# Patient Record
Sex: Female | Born: 1970
Health system: Southern US, Community
[De-identification: ages and names within clinical notes are randomized; demographics above are authoritative.]

## PROBLEM LIST (undated history)

## (undated) DIAGNOSIS — K219 Gastro-esophageal reflux disease without esophagitis: Secondary | ICD-10-CM

## (undated) DIAGNOSIS — F32A Depression, unspecified: Secondary | ICD-10-CM

## (undated) DIAGNOSIS — T7840XA Allergy, unspecified, initial encounter: Secondary | ICD-10-CM

## (undated) DIAGNOSIS — E669 Obesity, unspecified: Secondary | ICD-10-CM

## (undated) DIAGNOSIS — F329 Major depressive disorder, single episode, unspecified: Secondary | ICD-10-CM

## (undated) DIAGNOSIS — Z86718 Personal history of other venous thrombosis and embolism: Secondary | ICD-10-CM

## (undated) HISTORY — PX: OTHER SURGICAL HISTORY: SHX169

## (undated) HISTORY — DX: Depression, unspecified: F32.A

## (undated) HISTORY — PX: TUBAL LIGATION: SHX77

## (undated) HISTORY — DX: Allergy, unspecified, initial encounter: T78.40XA

## (undated) HISTORY — DX: Obesity, unspecified: E66.9

## (undated) HISTORY — DX: Gastro-esophageal reflux disease without esophagitis: K21.9

## (undated) HISTORY — PX: BREAST BIOPSY: SHX20

## (undated) HISTORY — DX: Personal history of other venous thrombosis and embolism: Z86.718

## (undated) HISTORY — DX: Major depressive disorder, single episode, unspecified: F32.9

---

## 2005-11-27 ENCOUNTER — Encounter: Payer: Self-pay | Admitting: Family Medicine

## 2005-12-07 ENCOUNTER — Other Ambulatory Visit: Admission: RE | Admit: 2005-12-07 | Discharge: 2005-12-07 | Payer: Self-pay | Admitting: Obstetrics and Gynecology

## 2005-12-22 ENCOUNTER — Emergency Department (HOSPITAL_COMMUNITY): Admission: EM | Admit: 2005-12-22 | Discharge: 2005-12-22 | Payer: Self-pay | Admitting: *Deleted

## 2005-12-31 ENCOUNTER — Encounter: Payer: Self-pay | Admitting: Family Medicine

## 2005-12-31 LAB — CONVERTED CEMR LAB
Cholesterol: 261 mg/dL
Hemoglobin: 13.4 g/dL
Platelets: 316 10*3/uL
Triglycerides: 180 mg/dL
WBC, blood: 6.4 10*3/uL

## 2006-01-01 ENCOUNTER — Encounter: Payer: Self-pay | Admitting: Family Medicine

## 2006-01-01 LAB — CONVERTED CEMR LAB
AST: 24 units/L
Alkaline Phosphatase: 57 units/L
BUN: 14 mg/dL
Chloride: 105 meq/L
Creatinine, Ser: 0.7 mg/dL
Glucose, Bld: 87 mg/dL
Potassium: 5.1 meq/L

## 2006-02-02 ENCOUNTER — Ambulatory Visit (HOSPITAL_COMMUNITY): Admission: RE | Admit: 2006-02-02 | Discharge: 2006-02-02 | Payer: Self-pay | Admitting: Obstetrics and Gynecology

## 2006-02-02 ENCOUNTER — Encounter (INDEPENDENT_AMBULATORY_CARE_PROVIDER_SITE_OTHER): Payer: Self-pay | Admitting: Specialist

## 2007-08-25 ENCOUNTER — Ambulatory Visit: Payer: Self-pay | Admitting: Family Medicine

## 2007-08-25 DIAGNOSIS — F329 Major depressive disorder, single episode, unspecified: Secondary | ICD-10-CM | POA: Insufficient documentation

## 2007-08-26 ENCOUNTER — Ambulatory Visit (HOSPITAL_COMMUNITY): Payer: Self-pay | Admitting: Psychiatry

## 2007-08-30 ENCOUNTER — Other Ambulatory Visit (HOSPITAL_COMMUNITY): Admission: RE | Admit: 2007-08-30 | Discharge: 2007-11-28 | Payer: Self-pay | Admitting: Psychiatry

## 2007-08-31 ENCOUNTER — Ambulatory Visit: Payer: Self-pay | Admitting: Psychiatry

## 2007-09-06 ENCOUNTER — Encounter: Payer: Self-pay | Admitting: Family Medicine

## 2007-09-07 ENCOUNTER — Encounter: Payer: Self-pay | Admitting: Family Medicine

## 2007-09-15 ENCOUNTER — Encounter: Payer: Self-pay | Admitting: Family Medicine

## 2007-09-16 ENCOUNTER — Ambulatory Visit (HOSPITAL_COMMUNITY): Payer: Self-pay | Admitting: Psychiatry

## 2007-09-30 ENCOUNTER — Ambulatory Visit (HOSPITAL_COMMUNITY): Payer: Self-pay | Admitting: Psychiatry

## 2007-10-21 ENCOUNTER — Ambulatory Visit (HOSPITAL_COMMUNITY): Payer: Self-pay | Admitting: Psychiatry

## 2007-11-02 ENCOUNTER — Ambulatory Visit (HOSPITAL_COMMUNITY): Payer: Self-pay | Admitting: Psychiatry

## 2007-11-10 ENCOUNTER — Ambulatory Visit (HOSPITAL_COMMUNITY): Payer: Self-pay | Admitting: Psychiatry

## 2007-11-17 ENCOUNTER — Ambulatory Visit (HOSPITAL_COMMUNITY): Payer: Self-pay | Admitting: Psychiatry

## 2007-12-05 ENCOUNTER — Ambulatory Visit (HOSPITAL_COMMUNITY): Payer: Self-pay | Admitting: Psychiatry

## 2007-12-19 ENCOUNTER — Ambulatory Visit (HOSPITAL_COMMUNITY): Payer: Self-pay | Admitting: Psychiatry

## 2007-12-27 ENCOUNTER — Ambulatory Visit (HOSPITAL_COMMUNITY): Payer: Self-pay | Admitting: Psychiatry

## 2008-01-05 ENCOUNTER — Ambulatory Visit: Payer: Self-pay | Admitting: Family Medicine

## 2008-01-05 DIAGNOSIS — R635 Abnormal weight gain: Secondary | ICD-10-CM | POA: Insufficient documentation

## 2008-01-12 ENCOUNTER — Ambulatory Visit (HOSPITAL_COMMUNITY): Payer: Self-pay | Admitting: Psychiatry

## 2008-01-23 ENCOUNTER — Ambulatory Visit (HOSPITAL_COMMUNITY): Payer: Self-pay | Admitting: Psychiatry

## 2008-02-01 ENCOUNTER — Ambulatory Visit (HOSPITAL_COMMUNITY): Payer: Self-pay | Admitting: Psychiatry

## 2008-02-06 ENCOUNTER — Ambulatory Visit: Payer: Self-pay | Admitting: Family Medicine

## 2008-02-06 ENCOUNTER — Ambulatory Visit (HOSPITAL_COMMUNITY): Payer: Self-pay | Admitting: Psychology

## 2008-02-06 DIAGNOSIS — J45909 Unspecified asthma, uncomplicated: Secondary | ICD-10-CM | POA: Insufficient documentation

## 2008-02-20 ENCOUNTER — Ambulatory Visit (HOSPITAL_COMMUNITY): Payer: Self-pay | Admitting: Psychology

## 2008-02-21 ENCOUNTER — Ambulatory Visit: Payer: Self-pay | Admitting: Family Medicine

## 2008-02-29 ENCOUNTER — Telehealth: Payer: Self-pay | Admitting: Family Medicine

## 2008-03-07 ENCOUNTER — Encounter: Admission: RE | Admit: 2008-03-07 | Discharge: 2008-03-07 | Payer: Self-pay | Admitting: Family Medicine

## 2008-03-07 ENCOUNTER — Ambulatory Visit (HOSPITAL_COMMUNITY): Payer: Self-pay | Admitting: Psychiatry

## 2008-03-07 ENCOUNTER — Encounter: Payer: Self-pay | Admitting: Family Medicine

## 2008-03-07 ENCOUNTER — Ambulatory Visit: Payer: Self-pay | Admitting: Family Medicine

## 2008-03-07 ENCOUNTER — Other Ambulatory Visit: Admission: RE | Admit: 2008-03-07 | Discharge: 2008-03-07 | Payer: Self-pay | Admitting: Family Medicine

## 2008-03-07 DIAGNOSIS — E669 Obesity, unspecified: Secondary | ICD-10-CM | POA: Insufficient documentation

## 2008-03-07 LAB — CONVERTED CEMR LAB
Alkaline Phosphatase: 44 units/L (ref 39–117)
CO2: 24 meq/L (ref 19–32)
Cholesterol: 154 mg/dL (ref 0–200)
Creatinine, Ser: 0.82 mg/dL (ref 0.40–1.20)
Glucose, Bld: 93 mg/dL (ref 70–99)
HDL: 45 mg/dL (ref >39)
LDL Cholesterol: 91 mg/dL (ref 0–99)
Total Bilirubin: 2.5 mg/dL — ABNORMAL HIGH (ref 0.3–1.2)
Total CHOL/HDL Ratio: 3.4
Triglycerides: 91 mg/dL (ref <150)
VLDL: 18 mg/dL (ref 0–40)

## 2008-03-08 ENCOUNTER — Encounter: Payer: Self-pay | Admitting: Family Medicine

## 2008-03-12 ENCOUNTER — Encounter: Admission: RE | Admit: 2008-03-12 | Discharge: 2008-03-12 | Payer: Self-pay | Admitting: Family Medicine

## 2008-03-21 ENCOUNTER — Encounter (INDEPENDENT_AMBULATORY_CARE_PROVIDER_SITE_OTHER): Payer: Self-pay | Admitting: *Deleted

## 2008-06-15 ENCOUNTER — Telehealth: Payer: Self-pay | Admitting: Family Medicine

## 2008-09-10 ENCOUNTER — Ambulatory Visit (HOSPITAL_COMMUNITY): Payer: Self-pay | Admitting: Psychiatry

## 2008-09-24 ENCOUNTER — Ambulatory Visit: Payer: Self-pay | Admitting: Family Medicine

## 2008-09-24 DIAGNOSIS — N3941 Urge incontinence: Secondary | ICD-10-CM | POA: Insufficient documentation

## 2008-09-24 DIAGNOSIS — N3000 Acute cystitis without hematuria: Secondary | ICD-10-CM | POA: Insufficient documentation

## 2008-09-24 LAB — CONVERTED CEMR LAB
Nitrite: NEGATIVE
Protein, U semiquant: NEGATIVE
Urobilinogen, UA: 0.2
WBC Urine, dipstick: NEGATIVE

## 2008-10-01 ENCOUNTER — Ambulatory Visit: Payer: Self-pay | Admitting: Family Medicine

## 2008-10-01 DIAGNOSIS — R310 Gross hematuria: Secondary | ICD-10-CM | POA: Insufficient documentation

## 2008-10-01 LAB — CONVERTED CEMR LAB
Glucose, Urine, Semiquant: NEGATIVE
Nitrite: NEGATIVE
Urobilinogen, UA: 0.2
WBC Urine, dipstick: NEGATIVE
pH: 7.5

## 2008-10-03 ENCOUNTER — Encounter: Admission: RE | Admit: 2008-10-03 | Discharge: 2008-10-03 | Payer: Self-pay | Admitting: Family Medicine

## 2008-10-18 ENCOUNTER — Ambulatory Visit (HOSPITAL_COMMUNITY): Payer: Self-pay | Admitting: Psychiatry

## 2008-10-24 ENCOUNTER — Encounter: Payer: Self-pay | Admitting: Family Medicine

## 2008-10-26 ENCOUNTER — Ambulatory Visit (HOSPITAL_COMMUNITY): Payer: Self-pay | Admitting: Psychology

## 2008-10-29 ENCOUNTER — Telehealth: Payer: Self-pay | Admitting: Family Medicine

## 2008-11-05 ENCOUNTER — Ambulatory Visit (HOSPITAL_COMMUNITY): Payer: Self-pay | Admitting: Psychology

## 2008-11-12 ENCOUNTER — Ambulatory Visit (HOSPITAL_COMMUNITY): Payer: Self-pay | Admitting: Psychology

## 2008-11-26 ENCOUNTER — Ambulatory Visit (HOSPITAL_COMMUNITY): Payer: Self-pay | Admitting: Psychology

## 2008-11-28 ENCOUNTER — Ambulatory Visit (HOSPITAL_COMMUNITY): Payer: Self-pay | Admitting: Psychiatry

## 2008-12-04 ENCOUNTER — Telehealth: Payer: Self-pay | Admitting: Family Medicine

## 2008-12-12 ENCOUNTER — Ambulatory Visit: Payer: Self-pay | Admitting: Family Medicine

## 2008-12-12 DIAGNOSIS — B029 Zoster without complications: Secondary | ICD-10-CM | POA: Insufficient documentation

## 2008-12-17 ENCOUNTER — Ambulatory Visit: Payer: Self-pay | Admitting: Family Medicine

## 2008-12-17 DIAGNOSIS — M546 Pain in thoracic spine: Secondary | ICD-10-CM | POA: Insufficient documentation

## 2008-12-17 DIAGNOSIS — R209 Unspecified disturbances of skin sensation: Secondary | ICD-10-CM | POA: Insufficient documentation

## 2009-01-01 ENCOUNTER — Telehealth: Payer: Self-pay | Admitting: Family Medicine

## 2009-01-16 ENCOUNTER — Other Ambulatory Visit: Admission: RE | Admit: 2009-01-16 | Discharge: 2009-01-16 | Payer: Self-pay | Admitting: Obstetrics & Gynecology

## 2009-01-16 ENCOUNTER — Encounter: Payer: Self-pay | Admitting: Obstetrics & Gynecology

## 2009-01-16 ENCOUNTER — Ambulatory Visit: Payer: Self-pay | Admitting: Obstetrics & Gynecology

## 2009-01-17 ENCOUNTER — Encounter: Payer: Self-pay | Admitting: Obstetrics & Gynecology

## 2009-01-17 LAB — CONVERTED CEMR LAB
Glucose, Bld: 89 mg/dL (ref 70–99)
HDL: 64 mg/dL (ref >39)
Total CHOL/HDL Ratio: 3.1
VLDL: 17 mg/dL (ref 0–40)

## 2009-01-18 ENCOUNTER — Encounter: Admission: RE | Admit: 2009-01-18 | Discharge: 2009-01-18 | Payer: Self-pay | Admitting: Obstetrics & Gynecology

## 2009-01-23 ENCOUNTER — Ambulatory Visit: Payer: Self-pay | Admitting: Obstetrics & Gynecology

## 2009-02-05 ENCOUNTER — Ambulatory Visit (HOSPITAL_COMMUNITY): Payer: Self-pay | Admitting: Psychology

## 2009-02-13 ENCOUNTER — Encounter: Payer: Self-pay | Admitting: Family Medicine

## 2009-02-27 ENCOUNTER — Ambulatory Visit (HOSPITAL_COMMUNITY): Payer: Self-pay | Admitting: Psychiatry

## 2009-03-05 ENCOUNTER — Ambulatory Visit (HOSPITAL_COMMUNITY): Payer: Self-pay | Admitting: Psychology

## 2009-03-18 ENCOUNTER — Telehealth (INDEPENDENT_AMBULATORY_CARE_PROVIDER_SITE_OTHER): Payer: Self-pay | Admitting: *Deleted

## 2009-03-20 ENCOUNTER — Ambulatory Visit: Payer: Self-pay | Admitting: Family Medicine

## 2009-03-20 DIAGNOSIS — M79609 Pain in unspecified limb: Secondary | ICD-10-CM | POA: Insufficient documentation

## 2009-03-20 LAB — CONVERTED CEMR LAB
Anti Nuclear Antibody(ANA): NEGATIVE
Basophils Absolute: 0 10*3/uL (ref 0.0–0.1)
Basophils Relative: 0 % (ref 0–1)
CO2: 24 meq/L (ref 19–32)
Creatinine, Ser: 0.64 mg/dL (ref 0.40–1.20)
Eosinophils Relative: 1 % (ref 0–5)
Folate: 14.2 ng/mL
Glucose, Bld: 91 mg/dL (ref 70–99)
Hemoglobin: 14.4 g/dL (ref 12.0–15.0)
MCHC: 34.9 g/dL (ref 30.0–36.0)
Monocytes Absolute: 0.5 10*3/uL (ref 0.1–1.0)
Neutro Abs: 3.8 10*3/uL (ref 1.7–7.7)
RDW: 13.3 % (ref 11.5–15.5)
Total Bilirubin: 1.6 mg/dL — ABNORMAL HIGH (ref 0.3–1.2)
Vitamin B-12: 556 pg/mL (ref 211–911)

## 2009-03-21 ENCOUNTER — Telehealth: Payer: Self-pay | Admitting: Family Medicine

## 2009-03-26 ENCOUNTER — Telehealth (INDEPENDENT_AMBULATORY_CARE_PROVIDER_SITE_OTHER): Payer: Self-pay | Admitting: *Deleted

## 2009-03-27 ENCOUNTER — Ambulatory Visit (HOSPITAL_COMMUNITY): Payer: Self-pay | Admitting: Psychology

## 2009-04-05 ENCOUNTER — Telehealth: Payer: Self-pay | Admitting: Family Medicine

## 2009-04-26 ENCOUNTER — Ambulatory Visit: Payer: Self-pay | Admitting: Physical Medicine & Rehabilitation

## 2009-04-26 ENCOUNTER — Encounter: Payer: Self-pay | Admitting: Family Medicine

## 2010-01-20 ENCOUNTER — Ambulatory Visit: Payer: Self-pay | Admitting: Family Medicine

## 2010-01-20 ENCOUNTER — Other Ambulatory Visit: Admission: RE | Admit: 2010-01-20 | Discharge: 2010-01-20 | Payer: Self-pay | Admitting: Family Medicine

## 2010-01-21 ENCOUNTER — Ambulatory Visit: Payer: Self-pay | Admitting: Family Medicine

## 2010-01-23 LAB — CONVERTED CEMR LAB: Pap Smear: NEGATIVE

## 2010-05-21 ENCOUNTER — Ambulatory Visit: Payer: Self-pay | Admitting: Family Medicine

## 2010-05-21 DIAGNOSIS — IMO0002 Reserved for concepts with insufficient information to code with codable children: Secondary | ICD-10-CM | POA: Insufficient documentation

## 2010-05-21 DIAGNOSIS — H05229 Edema of unspecified orbit: Secondary | ICD-10-CM | POA: Insufficient documentation

## 2010-05-22 ENCOUNTER — Encounter: Admission: RE | Admit: 2010-05-22 | Discharge: 2010-05-22 | Payer: Self-pay | Admitting: Family Medicine

## 2010-05-27 ENCOUNTER — Ambulatory Visit: Payer: Self-pay | Admitting: Family Medicine

## 2010-05-27 ENCOUNTER — Encounter: Admission: RE | Admit: 2010-05-27 | Discharge: 2010-05-27 | Payer: Self-pay | Admitting: Family Medicine

## 2010-05-27 DIAGNOSIS — S8010XA Contusion of unspecified lower leg, initial encounter: Secondary | ICD-10-CM | POA: Insufficient documentation

## 2010-06-02 ENCOUNTER — Ambulatory Visit: Payer: Self-pay | Admitting: Family Medicine

## 2010-06-02 ENCOUNTER — Encounter: Admission: RE | Admit: 2010-06-02 | Discharge: 2010-06-02 | Payer: Self-pay | Admitting: Family Medicine

## 2010-06-02 DIAGNOSIS — S060X9A Concussion with loss of consciousness of unspecified duration, initial encounter: Secondary | ICD-10-CM | POA: Insufficient documentation

## 2010-06-02 DIAGNOSIS — S060XAA Concussion with loss of consciousness status unknown, initial encounter: Secondary | ICD-10-CM | POA: Insufficient documentation

## 2010-10-20 ENCOUNTER — Ambulatory Visit: Payer: Self-pay | Admitting: Family Medicine

## 2010-10-20 DIAGNOSIS — S335XXA Sprain of ligaments of lumbar spine, initial encounter: Secondary | ICD-10-CM | POA: Insufficient documentation

## 2010-10-27 ENCOUNTER — Encounter
Admission: RE | Admit: 2010-10-27 | Discharge: 2010-12-08 | Payer: Self-pay | Source: Home / Self Care | Attending: Family Medicine | Admitting: Family Medicine

## 2010-10-29 ENCOUNTER — Ambulatory Visit: Payer: Self-pay | Admitting: Family Medicine

## 2010-10-29 LAB — CONVERTED CEMR LAB
Nitrite: POSITIVE
Urobilinogen, UA: 0.2
pH: 5.5

## 2010-10-30 ENCOUNTER — Encounter: Payer: Self-pay | Admitting: Family Medicine

## 2010-11-21 ENCOUNTER — Encounter: Admission: RE | Admit: 2010-11-21 | Discharge: 2010-11-21 | Payer: Self-pay | Admitting: Family Medicine

## 2010-12-03 ENCOUNTER — Encounter: Payer: Self-pay | Admitting: Family Medicine

## 2010-12-08 ENCOUNTER — Ambulatory Visit: Payer: Self-pay | Admitting: Family Medicine

## 2010-12-30 ENCOUNTER — Telehealth: Payer: Self-pay | Admitting: Family Medicine

## 2011-01-18 ENCOUNTER — Encounter: Payer: Self-pay | Admitting: Family Medicine

## 2011-01-18 ENCOUNTER — Encounter: Payer: Self-pay | Admitting: Obstetrics & Gynecology
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
## 2011-01-27 NOTE — Assessment & Plan Note (Signed)
Summary: POSS UTI/TJ (rm 3)   Vital Signs:  Patient Profile:   40 Years Old Female CC:      dysuria, urinary frequency x 3 days Height:     68 inches Weight:      223 pounds O2 Sat:      100 % O2 treatment:    Room Air Temp:     98.6 degrees F oral Pulse rate:   91 / minute Resp:     14 per minute BP sitting:   130 / 87  (left arm) Cuff size:   large  Pt. in pain?   no  Vitals Entered By: Lajean Saver RN (October 29, 2010 4:47 PM)                   Updated Prior Medication List: No Medications Current Allergies: No known allergies History of Present Illness Chief Complaint: dysuria, urinary frequency x 3 days History of Present Illness:  Subjective:  Patient presents complaining of UTI symptoms for 3 days.  Complains of dysuria, frequency, and urgency.  No hematuria or nocturia.  No abnormal vaginal discharge.  No fever/chills/sweats.  No abdominal pain.  No flank pain.  No nausea/vomiting.    REVIEW OF SYSTEMS Constitutional Symptoms      Denies fever, chills, night sweats, weight loss, weight gain, and fatigue.  Eyes       Denies change in vision, eye pain, eye discharge, glasses, contact lenses, and eye surgery. Ear/Nose/Throat/Mouth       Denies hearing loss/aids, change in hearing, ear pain, ear discharge, dizziness, frequent runny nose, frequent nose bleeds, sinus problems, sore throat, hoarseness, and tooth pain or bleeding.  Respiratory       Denies dry cough, productive cough, wheezing, shortness of breath, asthma, bronchitis, and emphysema/COPD.  Cardiovascular       Denies murmurs, chest pain, and tires easily with exhertion.    Gastrointestinal       Denies stomach pain, nausea/vomiting, diarrhea, constipation, blood in bowel movements, and indigestion. Genitourniary       Complains of painful urination.      Denies blood or discharge from vagina, kidney stones, and loss of urinary control.      Comments: urinary frequency Neurological       Denies  paralysis, seizures, and fainting/blackouts. Musculoskeletal       Denies muscle pain, joint pain, joint stiffness, decreased range of motion, redness, swelling, muscle weakness, and gout.  Skin       Denies bruising, unusual mles/lumps or sores, and hair/skin or nail changes.  Psych       Denies mood changes, temper/anger issues, anxiety/stress, speech problems, depression, and sleep problems.  Past History:  Past Medical History: Reviewed history from 01/20/2010 and no changes required. depression [employer] [id number] GERD obesity hx of superficial thromboembolism, L medial thigh lipoma, varicose veins Allergic rhinitis has been on zoloft chondromalacia patella- right  Past Surgical History: Reviewed history from 09/06/2007 and no changes required. BTL uterine ablation L breast biopsy 11/01  Family History: Reviewed history from 09/06/2007 and no changes required. mother alive, breast cancer at 48, +BRCA,  bladder cancer father alive and healthy brother healthy  Social History: Reviewed history from 10/20/2010 and no changes required. 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 Denies drug use 5 cups caffeine daily   Objective:   No acute distress, appears comfortable. Mouth:  moist mucous membranes Abdomen:  No  tenderness to deep palpation. No rebound or guarding.  Non-distended.  Active bowel sounds.  No CVA or flank tenderness.  No masses or hepatosplenomegaly.  Urinalysis reviewed.  urinalysis (dipstick):  2+ blood, positive nitrite Assessment New Problems: ACUTE CYSTITIS (ICD-595.0)   Plan New Medications/Changes: SULFAMETHOXAZOLE-TMP DS 800-160 MG TABS (SULFAMETHOXAZOLE-TRIMETHOPRIM) One by mouth two times a day  #10 x 0, 10/29/2010, Donna Christen MD  New Orders: Urinalysis [[id number]] Est. Patient Level III [16109] T-Culture, Urine [[id number]] Planning Comments:   Urine culture. Begin  Septra.  Increase fluids.  Follow-up with PCP if not improving.    The patient and/or caregiver has been counseled thoroughly with regard to medications prescribed including dosage, schedule, interactions, rationale for use, and possible side effects and they verbalize understanding.  Diagnoses and expected course of recovery discussed and will return if not improved as expected or if the condition worsens. Patient and/or caregiver verbalized understanding.  Prescriptions: SULFAMETHOXAZOLE-TMP DS 800-160 MG TABS (SULFAMETHOXAZOLE-TRIMETHOPRIM) One by mouth two times a day  #10 x 0   Entered and Authorized by:   Donna Christen MD   Signed by:   Donna Christen MD on 10/29/2010   Method used:   Print then Give to Patient   RxID:   [id number]   Orders Added: 1)  Urinalysis [81003-65000] 2)  Est. Patient Level III [78469] 3)  T-Culture, Urine [[id number]]    Laboratory Results   Urine Tests  Date/Time Received: October 29, 2010 4:53 PM  Date/Time Reported: October 29, 2010 4:53 PM   Routine Urinalysis   Color: yellow Appearance: Hazy Glucose: negative   (Normal Range: Negative) Bilirubin: negative   (Normal Range: Negative) Ketone: negative   (Normal Range: Negative) Spec. Gravity: >=1.030   (Normal Range: 1.003-1.035) Blood: 2+   (Normal Range: Negative) pH: 5.5   (Normal Range: 5.0-8.0) Protein: negative   (Normal Range: Negative) Urobilinogen: 0.2   (Normal Range: 0-1) Nitrite: positive   (Normal Range: Negative) Leukocyte Esterace: negative   (Normal Range: Negative)

## 2011-01-27 NOTE — Assessment & Plan Note (Signed)
Summary: BRCA testing  Nurse Visit   Vitals Entered By: Payton Spark CMA (January 21, 2010 9:46 AM)  Allergies: No Known Drug Allergies  Orders Added: 1)  Est. Patient Level I [29562]    Impression & Recommendations:  Problem # 1:  FAMILY HISTORY OF MALIGNANT NEOPLASM OF BREAST (ICD-V16.3) BRCA testing done today by CMA today.  Brochure given to pt.  Specimen collected and shipped out. Orders: Est. Patient Level I (13086)  Complete Medication List: 1)  Omeprazole 10 Mg Cpdr (Omeprazole) .... Take three tabs by mouth daily 2)  One Daily Womens Tabs (Multiple vitamins-minerals) .... Take 1 tablet by mouth once a day 3)  Vitamin B12 100 Mcg Tabs (Cyanocobalamin) .... Take 1 tablet by mouth once a day 4)  Symbyax 3-25 Mg Caps (Olanzapine-fluoxetine hcl) .... Take 1 cap by mouth two times a day 5)  Amoxicillin 500 Mg Caps (Amoxicillin) .Marland Kitchen.. 1 tab by mouth three times a day x 7 days
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
## 2011-01-27 NOTE — Assessment & Plan Note (Signed)
Summary: CPE with pap   Vital Signs:  Patient profile:   40 year old female Height:      68 inches Weight:      256 pounds BMI:     39.07 O2 Sat:      100 % on Room air Temp:     98.0 degrees F oral Pulse rate:   75 / minute BP sitting:   115 / 77  (left arm) Cuff size:   large  Vitals Entered By: Payton Spark CMA (January 20, 2010 3:45 PM)  O2 Flow:  Room air CC: CPE w/ pap   Primary Care Provider:  [name] DO  CC:  CPE w/ pap.  History of Present Illness: 39 yo WF presents for CPE with pap smear.  She had a uterine ablation so no longer has periods.  she is seeing the [facility] in [facility] for psychiatric care -- doing well on symbiax.  She gained 46 lbs in the last 9 months.  She is due for a mammogram.  She was w/o insurance for a while.  She is due for fasting labs.  She has fam hx of breast cancer and her mom tested BRCA +.  She is interested in BRCA testing for both she and her daughter.  Denies fam hx of colon cancer or premature heart dz.  She does continue to smoke.    Allergies: No Known Drug Allergies  Past History:  Past Medical History: depression The [employer] [id number] GERD obesity hx of superficial thromboembolism, L medial thigh lipoma, varicose veins Allergic rhinitis has been on zoloft chondromalacia patella- right  Past Surgical History: Reviewed history from 09/06/2007 and no changes required. BTL uterine ablation L breast biopsy 11/01  Family History: Reviewed history from 09/06/2007 and no changes required. mother alive, breast cancer at 79, +BRCA,  bladder cancer father alive and healthy brother healthy  Social History: Reviewed history from 02/21/2008 and no changes required. Investment banker, corporate for [employer] Has 3 kids Married to [employer] Smokes 1 ppw-- quit 2-09 Social ETOH. Denies drug use 5 cups caffeine daily  Review of Systems       The patient complains of weight gain.  The patient denies anorexia, fever, weight  loss, vision loss, decreased hearing, hoarseness, chest pain, syncope, dyspnea on exertion, peripheral edema, prolonged cough, headaches, hemoptysis, abdominal pain, melena, hematochezia, severe indigestion/heartburn, hematuria, incontinence, genital sores, muscle weakness, suspicious skin lesions, transient blindness, difficulty walking, depression, unusual weight change, abnormal bleeding, enlarged lymph nodes, angioedema, breast masses, and testicular masses.    Physical Exam  General:  alert, well-developed, well-nourished, and well-hydrated.  obese Head:  normocephalic and atraumatic.   Eyes:  pupils equal, pupils round, and pupils reactive to light.   Ears:  no external deformities.   Nose:  no nasal discharge.   Mouth:  good dentition and pharynx pink and moist.   Neck:  no masses.   Breasts:  No mass, nodules, thickening, tenderness, bulging, retraction, inflamation, nipple discharge or skin changes noted.   Lungs:  Normal respiratory effort, chest expands symmetrically. Lungs are clear to auscultation, no crackles or wheezes. Heart:  Normal rate and regular rhythm. S1 and S2 normal without gallop, murmur, click, rub or other extra sounds. Abdomen:  Bowel sounds positive,abdomen soft and non-tender without masses, organomegaly or hernias noted. Genitalia:  Pelvic Exam:        External: normal female genitalia without lesions or masses        Vagina: normal  without lesions or masses        Cervix: normal without lesions or masses        Adnexa: normal bimanual exam without masses or fullness        Uterus: normal by palpation        Pap smear: performed Pulses:  2+ radial and pedal pulses Extremities:  no LE edema Skin:  color normal.   Cervical Nodes:  No lymphadenopathy noted Psych:  good eye contact, not anxious appearing, and not depressed appearing.     Impression & Recommendations:  Problem # 1:  ROUTINE GYNECOLOGICAL EXAMINATION (ICD-V72.31) Keeping healthy checklist for  women reviewed. BP at goal.  BMI 39 c/w class II obesity.  Will work on diet, exercise, wt loss and RTC in 3 mos to check on her progress. Tetanus is UTD. Update mammogram. Info given on BRCA testing. Update fasting labs. Counseled on smoking cessation. Daily MVI and Calcium/D recommended.    Complete Medication List: 1)  Omeprazole 10 Mg Cpdr (Omeprazole) .... Take three tabs by mouth daily 2)  One Daily Womens Tabs (Multiple vitamins-minerals) .... Take 1 tablet by mouth once a day 3)  Vitamin B12 100 Mcg Tabs (Cyanocobalamin) .... Take 1 tablet by mouth once a day 4)  Symbyax 3-25 Mg Caps (Olanzapine-fluoxetine hcl) .... Take 1 cap by mouth two times a day 5)  Amoxicillin 500 Mg Caps (Amoxicillin) .Marland Kitchen.. 1 tab by mouth three times a day x 7 days  Other Orders: T-Comprehensive Metabolic Panel [phone]) T-Lipid Profile ([id number]) T-Mammography Bilateral Screening (27062)  Patient Instructions: 1)  Take Amoxicillin for 1 wk to cover for strep throat. 2)  Use Advil and throat lozenges for comfort. 3)  Call if you want to do the BRCA testing here. 4)  Work on [employer] and regular exercise. 5)  Update fasting labs. 6)  Will call you w/ pap smear results in 2-3 days. 7)  Return for f/u weight in 3 mos. Prescriptions: AMOXICILLIN 500 MG CAPS (AMOXICILLIN) 1 tab by mouth three times a day x 7 days  #21 x 0   Entered and Authorized by:   [name] DO   Signed by:   [name] DO on 01/20/2010   Method used:   Electronically to        [facility] [phone]* (retail)       [address] [place], [place]  [address]       Ph: [phone]       Fax: [phone]   RxID:   [id number]
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
## 2011-01-29 NOTE — Miscellaneous (Signed)
Summary: PT Discharge/[facility]  PT Discharge/[facility]   Imported By: Lanelle Bal 12/19/2010 08:41:53  _____________________________________________________________________  External Attachment:    Type:   Image     Comment:   External Document
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
## 2011-01-29 NOTE — Progress Notes (Signed)
Summary: Ortho referral  Phone Note Call from Patient Call back at Home Phone [phone]   Caller: Patient Call For: [name] DO Summary of Call: Pt calls and states you had discussed her seeing a sports med doctor for her back before the holidays. She would like to get this set up for her back pain. Initial call taken by: Kathlene November LPN,  December 30, 2010 4:18 PM  Follow-up for Phone Call        done/. Follow-up by: [name] DO,  December 31, 2010 7:49 AM

## 2011-05-12 NOTE — Assessment & Plan Note (Signed)
NAME:  Michaela Dodson, Michaela Dodson              ACCOUNT NO.:  [id number]   MEDICAL RECORD NO.:  [id number]          PATIENT TYPE:  POB   LOCATION:  [facility] [place]         FACILITY:  [facility]   PHYSICIAN:  Allie Bossier, MD        DATE OF BIRTH:  19-Sep-1971   DATE OF SERVICE:                                  CLINIC NOTE   [name] is a 40 year old married white lady who is following up on her  ultrasound.  She was seen for her annual exam here last week, and I felt  on bimanual exam that her uterus is enlarged as per the ultrasound.  Her  ultrasound actually shows her uterus to measure 9.9 x 7.2 cm with the  possibility of adenomyosis versus a small fibroid.   LABORATORY DATA:  Essentially normal, thyroid was normal.  Glucose was  normal.  Her LDL was slightly elevated at 120, but her HDL was great at  64, and her total cholesterol was 201.  Triglycerides were well and in  normal limits at 87 as well.  Her other complaint the other day was that  of new onset deep dyspareunia for the last 4 months.  I think that it is  related to her probable IVF.  With her husband present, I got more  information specifically that she uses laxatives at least 3 days a week  along with fiber, but still has constipation.  I suggested that for the  next 2 weeks amiodarone, she uses MiraLax on a daily basis and start  with DanActive yogurt drinks on a daily basis, as well as increasing her  water intake to at least 60 ounces a day.  She will come back in 1-2  months and let me know how this is working for her.      Allie Bossier, MD     MCD/MEDQ  D:  01/23/2009  T:  01/24/2009  Job:  [id number]

## 2011-05-12 NOTE — Assessment & Plan Note (Signed)
NAME:  Michaela Dodson, Michaela Dodson              ACCOUNT NO.:  [id number]   MEDICAL RECORD NO.:  [id number]          PATIENT TYPE:  POB   LOCATION:  [facility] [place]         FACILITY:  [facility]   PHYSICIAN:  Allie Bossier, MD        DATE OF BIRTH:  1971-07-25   DATE OF SERVICE:                                  CLINIC NOTE   HISTORY OF PRESENT ILLNESS:  Michaela Dodson is 40 year old married white  gravida 4, para 3 who comes in for annual exam.  She has no particular  complaints today except that of not being able to lose weight.  She has  lost 40 pounds recently, but it has come to a standstill on her weight  loss.  She has not had her thyroid checked in many years.   PAST MEDICAL HISTORY:  Depression.   PAST SURGICAL HISTORY:  She has had a cystoscopy recently for a  hematuria.  This was done at the [facility], endometrial ablation in  January 2005, and she has been amenorrhea since that surgery,  sterilization procedure postpartum done in 2004, appendectomy, and  wisdom teeth extraction.  She had a right breast abscess surgery done  when she was 40 years old and a left breast biopsy done that was  reportedly benign as an adult.   ALLERGIES:  No known drug allergies.  No latex allergies.   FAMILY HISTORY:  Significant for breast and bladder cancer in her mother  who is currently alive and well in her 81s and colon cancer in maternal  grandmother.  She is going to investigate more fully her family history  to see if there is any other breast, colon, or ovarian cancer in the  family.   SOCIAL HISTORY:  She has been married for 13 years.  She owns a cleaning  business.   REVIEW OF SYSTEMS:  Her last Pap smear, she cannot remember when it was,  but she thinks it was in 2004.  She denies a history of abnormal Pap.  She does report some deep dyspareunia for the last 4 months.   PHYSICAL EXAMINATION:  VITAL SIGNS:  Weight 195 pounds, height 5 feet 8  inches.  Blood pressure 161/91 and pulse 107.  HEENT:  Normal.  BREASTS:  Normal bilaterally.  HEART:  Regular rate and rhythm.  LUNGS:  Clear to auscultation bilaterally.  ABDOMEN:  Mildly obese and benign.  EXTERNAL GENITALIA:  No lesions.  Cervix normal.  Uterus is 10-week size  consistent with fibroids.  Adnexa are not palpable.   ASSESSMENT AND PLAN:  1. Annual exam.  We checked a Pap smear.  2. We will recommend a self-breast and self vulvar exams.  With regard      to her uterine enlargement, I have ordered a GYN ultrasound.  This      may be the cause of her deep dyspareunia.  3. Screening labs.  I have ordered a fasting sugar, lipids, and TSH to      be done on the days that she gets her ultrasound done.  She will      follow up in 2 weeks for followup of all these things.  Allie Bossier, MD     MCD/MEDQ  D:  01/16/2009  T:  01/17/2009  Job:  [id number]

## 2011-05-15 NOTE — Op Note (Signed)
Michaela Dodson, Michaela Dodson              ACCOUNT NO.:  [id number]   MEDICAL RECORD NO.:  [id number]          PATIENT TYPE:  AMB   LOCATION:  [facility]                           FACILITY:  [facility]   PHYSICIAN:  Carrington Clamp, M.D. DATE OF BIRTH:  [birth year]   DATE OF PROCEDURE:  02/02/2006  DATE OF DISCHARGE:                                 OPERATIVE REPORT   PREOPERATIVE DIAGNOSES:  1.  Menometrorrhagia.  2.  History deep vein thrombosis.   POSTOPERATIVE DIAGNOSES:  1.  Menometrorrhagia.  2.  History deep vein thrombosis.   PROCEDURE:  Dilation and curettage with hysteroscopy and NovaSure ablation.   SURGEON:  Carrington Clamp, M.D.   ASSISTANT:  None.  Posey Boyer of NovaSure was present in the operating  room.   ANESTHESIA:  LMA.   SPECIMENS:  Uterine curettings.   ESTIMATED BLOOD LOSS:  Minimal.   IV FLUIDS:  800 mL.   Urine output was not measured.  Fluid deficit on the hysteroscopy was 50 mL.   COMPLICATIONS:  None.   FINDINGS:  The uterus sounded to 7 cm with a 4 cm cervix.  Width of the  cavity was 4.8.  The NovaSure ablation was performed at 158 watts for 59  seconds.  There was a possible polyp seen inside the uterine cavity but no  other abnormalities.   Counts were correct x3.  No medications.   TECHNIQUE:  After adequate general anesthesia was achieved, the patient was  prepped and draped in the sterile fashion in the dorsal lithotomy position.  This bladder was emptied with a red rubber catheter and then a speculum  placed in the vagina.  A single-toothed tenaculum was placed in the cervix  and the cervix was dilated up with Shawnie Pons dilators after the cervix had been  measured at 4 cm.  The uterus was sounded at 10 cm.  The hysteroscope was  placed into the uterine cavity and the above findings noted.  A vigorous D&C  was performed with sharp curettage.  Tissue was sent to pathology.  The  NovaSure instrument was then placed inside the cavity with the above  parameters and after CO2 test was passed, the instrument was used at a power  of 158 for 59 seconds.  The NovaSure  instrument was removed without complication and hysteroscopy indicated that  the ablation had contacted all surfaces adequately and the uterus was still  intact.  All instruments were then withdrawn from the vagina and the patient  returned to the recovery room in stable condition.      Carrington Clamp, M.D.  Electronically Signed     MH/MEDQ  D:  02/02/2006  T:  02/02/2006  Job:  [id number]

## 2011-06-03 ENCOUNTER — Ambulatory Visit (INDEPENDENT_AMBULATORY_CARE_PROVIDER_SITE_OTHER): Payer: Commercial Indemnity | Admitting: Family Medicine

## 2011-06-03 ENCOUNTER — Telehealth: Payer: Self-pay | Admitting: Family Medicine

## 2011-06-03 VITALS — Temp 98.3°F

## 2011-06-03 DIAGNOSIS — R300 Dysuria: Secondary | ICD-10-CM

## 2011-06-03 DIAGNOSIS — R319 Hematuria, unspecified: Secondary | ICD-10-CM

## 2011-06-03 LAB — POCT URINALYSIS DIPSTICK
Bilirubin, UA: NEGATIVE
Ketones, UA: NEGATIVE
Leukocytes, UA: NEGATIVE
pH, UA: 7

## 2011-06-03 NOTE — Telephone Encounter (Signed)
Pt aware and agrees 

## 2011-06-03 NOTE — Telephone Encounter (Signed)
Pt aware of the above but states that back pain is only on lower R side and very painful. I advised Pt to drink water, lemonade and start cystex until I call her back w/ advice.

## 2011-06-03 NOTE — Progress Notes (Signed)
  Subjective:    Patient ID: Michaela Dodson, female    DOB: 18-Aug-1971, 40 y.o.   MRN: [id number]  HPI foul smelling urine x 3 days with dysuria and low back pain.  No fevers or chills.   Review of Systems     Objective:   Physical Exam        Assessment & Plan:  Hematuria/ dysuria with only blood present on UA. Will culture given neg nitrites and LE.

## 2011-06-03 NOTE — Patient Instructions (Signed)
Will culture urine and f/u results in the next 2 days.

## 2011-06-03 NOTE — Telephone Encounter (Signed)
Pls let pt know that her UA was + only for blood. Will culture and treat if +. Should be back by Friday.  In the meantime, use Advil for back pain, drink plenty of water and use OTC Cystex pure cranberry.

## 2011-06-03 NOTE — Progress Notes (Signed)
  Subjective:    Patient ID: Michaela Dodson, female    DOB: 26-Oct-1971, 40 y.o.   MRN: [id number]  HPI Pt c/o foul smelling urine and extreme back pain x 3 days.     Review of Systems     Objective:   Physical Exam        Assessment & Plan:

## 2011-06-03 NOTE — Telephone Encounter (Signed)
If her pain is severe and comes and goes, associated with nausea or vomitting, she needs to be evaluated for kidney stone.  If so, schedule OV with me tomorrow or UC today.

## 2011-06-06 ENCOUNTER — Telehealth: Payer: Self-pay | Admitting: Family Medicine

## 2011-06-06 LAB — URINE CULTURE: Colony Count: >=100000

## 2011-06-06 MED ORDER — CIPROFLOXACIN HCL 250 MG PO TABS: 250.0000 mg | ORAL_TABLET | Freq: Two times a day (BID) | ORAL | Status: AC

## 2011-06-06 NOTE — Telephone Encounter (Signed)
I sent pt message re: + urine culture with RX Cipro sent to pharmacy.

## 2011-12-03 ENCOUNTER — Emergency Department (INDEPENDENT_AMBULATORY_CARE_PROVIDER_SITE_OTHER)
Admission: EM | Admit: 2011-12-03 | Discharge: 2011-12-03 | Disposition: A | Discharge status: Home / Self Care | Payer: Commercial Indemnity | Source: Home / Self Care | Attending: Emergency Medicine | Admitting: Emergency Medicine

## 2011-12-03 ENCOUNTER — Encounter: Payer: Self-pay | Admitting: *Deleted

## 2011-12-03 DIAGNOSIS — J111 Influenza due to unidentified influenza virus with other respiratory manifestations: Secondary | ICD-10-CM

## 2011-12-03 DIAGNOSIS — J209 Acute bronchitis, unspecified: Secondary | ICD-10-CM

## 2011-12-03 MED ORDER — OSELTAMIVIR PHOSPHATE 75 MG PO CAPS: ORAL_CAPSULE | ORAL | Status: AC

## 2011-12-03 MED ORDER — PROMETHAZINE-CODEINE 6.25-10 MG/5ML PO SYRP: ORAL_SOLUTION | ORAL | Status: AC

## 2011-12-03 MED ORDER — AZITHROMYCIN 250 MG PO TABS: ORAL_TABLET | ORAL | Status: AC

## 2011-12-03 NOTE — ED Notes (Addendum)
Patient c/o productive cough, HA, fever, body aches x 2 days.

## 2011-12-03 NOTE — ED Provider Notes (Signed)
History     CSN: [id number] Arrival date & time: 12/03/2011  2:54 PM   First MD Initiated Contact with Patient 12/03/11 1454      Chief Complaint  Patient presents with  . Headache  . Generalized Body Aches    (Consider location/radiation/quality/duration/timing/severity/associated sxs/prior treatment) HPI Comments: HPI : Flu symptoms for about 1 day. Fever to 102 with chills, sweats, myalgias, fatigue, headache. Symptoms are progressively worsening, despite trying OTC fever reducing medicine and rest and fluids. Has decreased appetite, but tolerating some liquids by mouth. No history of recent tick bite.  Review of Systems: Positive for fatigue, mild nasal congestion, mild sore throat, mild swollen anterior neck glands, mild cough. Negative for acute vision changes, stiff neck, focal weakness, syncope, seizures, respiratory distress, vomiting, diarrhea, GU symptoms.  The history is provided by the patient.    History reviewed. No pertinent past medical history.  History reviewed. No pertinent past surgical history.  Family History  Problem Relation Age of Onset  . Cancer Mother     breast and bladder CA    History  Substance Use Topics  . Smoking status: Current Everyday Smoker  . Smokeless tobacco: Not on file  . Alcohol Use: Yes    OB History    Grav Para Term Preterm Abortions TAB SAB Ect Mult Living                  Review of Systems  Constitutional: Positive for fever, chills and appetite change.  HENT: Positive for congestion. Negative for facial swelling and neck stiffness.   Eyes: Negative.  Negative for visual disturbance.  Respiratory: Positive for cough (Productive of colored sputum) and wheezing (Rarely has wheezing, no shortness of breath).   Cardiovascular: Negative for chest pain, palpitations and leg swelling.  Gastrointestinal: Negative for abdominal distention.  Genitourinary: Negative.   Neurological: Positive for headaches (Without any  focal neurologic symptoms). Negative for seizures, syncope and numbness.  Hematological: Negative.   Psychiatric/Behavioral: Negative for hallucinations and confusion.    Allergies  Review of patient's allergies indicates no known allergies.  Home Medications   Current Outpatient Rx  Name Route Sig Dispense Refill  . PHENTERMINE HCL 30 MG PO CAPS Oral Take 30 mg by mouth every morning.      . AZITHROMYCIN 250 MG PO TABS  Take 2 tablets on day one, then 1 tablet daily on days 2 through 5 1 each 0  . OSELTAMIVIR PHOSPHATE 75 MG PO CAPS  Starting today, take 1 capsule by mouth twice a day for 5 days. 10 capsule 0  . PROMETHAZINE-CODEINE 6.25-10 MG/5ML PO SYRP  Take 1-2 teaspoons every 4-6 hours as needed for cough. May cause drowsiness. 120 mL 0    BP 106/63  Pulse 97  Temp(Src) 98.1 F (36.7 C) (Oral)  Resp 16  Ht 5\' 8"  (1.727 m)  Wt 180 lb (81.647 kg)  BMI 27.37 kg/m2  SpO2 98%  Physical Exam  Nursing note and vitals reviewed. Constitutional: She is oriented to person, place, and time. She appears well-developed and well-nourished.  Non-toxic appearance. She appears ill (very fatigued, but no cardiorespiratory distress). No distress.  HENT:  Head: Normocephalic and atraumatic.  Right Ear: Tympanic membrane and external ear normal.  Left Ear: Tympanic membrane and external ear normal.  Nose: Nose normal.  Mouth/Throat: Oropharynx is clear and moist and mucous membranes are normal. No oropharyngeal exudate.  Eyes: Conjunctivae are normal. Pupils are equal, round, and reactive to light. Right eye  exhibits no discharge. Left eye exhibits no discharge. No scleral icterus.  Fundoscopic exam:      The right eye shows no hemorrhage and no papilledema.       The left eye shows no hemorrhage and no papilledema.  Neck: Neck supple.  Cardiovascular: Normal rate, regular rhythm and normal heart sounds.   Pulmonary/Chest: No stridor. No respiratory distress. She has no wheezes. She has  rhonchi. She has no rales.  Abdominal: Soft. There is no tenderness.  Musculoskeletal: She exhibits no edema.  Lymphadenopathy:    She has no cervical adenopathy.  Neurological: She is alert and oriented to person, place, and time. She has normal reflexes. She displays normal reflexes. No cranial nerve deficit. She exhibits normal muscle tone. Coordination normal.  Skin: Skin is warm, dry and intact. No rash noted.  Psychiatric: She has a normal mood and affect.    ED Course  Procedures (including critical care time)     1. Influenza   2. Acute bronchitis       MDM  Clinically, she very likely has influenza, given that there is an influenza epidemic in this region. She declined doing flu test, and I agree as that would not change my management. Will treat with Tamiflu and other symptomatic care, see instructions in AVS. She also clinically has acute bronchitis, possibly bacterial cause, treat with Zithromax Z-Pak. Also, Phenergan with codeine prescribed for severe cough and headache. She has no neurologic findings to suggest any red flag as a cause for her headache. Followup with PCP in one week, sooner if worse or new symptoms.        Lonell Face, MD 12/03/11 [phone]

## 2012-01-05 ENCOUNTER — Other Ambulatory Visit: Payer: Self-pay | Admitting: Family Medicine

## 2012-01-05 ENCOUNTER — Other Ambulatory Visit: Payer: Self-pay | Admitting: Obstetrics & Gynecology

## 2012-01-05 DIAGNOSIS — Z1231 Encounter for screening mammogram for malignant neoplasm of breast: Secondary | ICD-10-CM

## 2012-01-06 ENCOUNTER — Ambulatory Visit
Admission: RE | Admit: 2012-01-06 | Discharge: 2012-01-06 | Disposition: A | Discharge status: Home / Self Care | Payer: Commercial Indemnity | Source: Ambulatory Visit | Attending: Obstetrics & Gynecology | Admitting: Obstetrics & Gynecology

## 2012-01-06 DIAGNOSIS — Z1231 Encounter for screening mammogram for malignant neoplasm of breast: Secondary | ICD-10-CM

## 2012-01-12 ENCOUNTER — Other Ambulatory Visit: Payer: Self-pay | Admitting: Obstetrics & Gynecology

## 2012-01-12 DIAGNOSIS — R928 Other abnormal and inconclusive findings on diagnostic imaging of breast: Secondary | ICD-10-CM

## 2012-01-20 ENCOUNTER — Ambulatory Visit
Admission: RE | Admit: 2012-01-20 | Discharge: 2012-01-20 | Disposition: A | Discharge status: Home / Self Care | Payer: Commercial Indemnity | Source: Ambulatory Visit | Attending: Obstetrics & Gynecology | Admitting: Obstetrics & Gynecology

## 2012-01-20 DIAGNOSIS — R928 Other abnormal and inconclusive findings on diagnostic imaging of breast: Secondary | ICD-10-CM

## 2012-03-02 ENCOUNTER — Ambulatory Visit: Payer: Commercial Indemnity

## 2012-03-02 ENCOUNTER — Telehealth: Payer: Self-pay | Admitting: Family Medicine

## 2012-03-02 ENCOUNTER — Ambulatory Visit (INDEPENDENT_AMBULATORY_CARE_PROVIDER_SITE_OTHER): Payer: Commercial Indemnity | Admitting: Family Medicine

## 2012-03-02 ENCOUNTER — Encounter: Payer: Self-pay | Admitting: Family Medicine

## 2012-03-02 VITALS — BP 123/98 | HR 81 | Temp 98.3°F | Ht 68.0 in | Wt 185.0 lb

## 2012-03-02 DIAGNOSIS — M545 Low back pain, unspecified: Secondary | ICD-10-CM

## 2012-03-02 DIAGNOSIS — G5600 Carpal tunnel syndrome, unspecified upper limb: Secondary | ICD-10-CM

## 2012-03-02 DIAGNOSIS — N390 Urinary tract infection, site not specified: Secondary | ICD-10-CM

## 2012-03-02 DIAGNOSIS — R030 Elevated blood-pressure reading, without diagnosis of hypertension: Secondary | ICD-10-CM

## 2012-03-02 DIAGNOSIS — IMO0001 Reserved for inherently not codable concepts without codable children: Secondary | ICD-10-CM

## 2012-03-02 LAB — POCT URINALYSIS DIPSTICK
Glucose, UA: NEGATIVE
Protein, UA: NEGATIVE
Spec Grav, UA: >=1.03
Urobilinogen, UA: 0.2
pH, UA: 5.5

## 2012-03-02 MED ORDER — CIPROFLOXACIN HCL 500 MG PO TABS: 500.0000 mg | ORAL_TABLET | Freq: Two times a day (BID) | ORAL | Status: AC

## 2012-03-02 NOTE — Patient Instructions (Signed)
Urinary Tract Infection Infections of the urinary tract can start in several places. A bladder infection (cystitis), a kidney infection (pyelonephritis), and a prostate infection (prostatitis) are different types of urinary tract infections (UTIs). They usually get better if treated with medicines (antibiotics) that kill germs. Take all the medicine until it is gone. You or your child may feel better in a few days, but TAKE ALL MEDICINE or the infection may not respond and may become more difficult to treat. HOME CARE INSTRUCTIONS   Drink enough water and fluids to keep the urine clear or pale yellow. Cranberry juice is especially recommended, in addition to large amounts of water.   Avoid caffeine, tea, and carbonated beverages. They tend to irritate the bladder.   Alcohol may irritate the prostate.   Only take over-the-counter or prescription medicines for pain, discomfort, or fever as directed by your caregiver.  To prevent further infections:  Empty the bladder often. Avoid holding urine for long periods of time.   After a bowel movement, women should cleanse from front to back. Use each tissue only once.   Empty the bladder before and after sexual intercourse.  FINDING OUT THE RESULTS OF YOUR TEST Not all test results are available during your visit. If your or your child's test results are not back during the visit, make an appointment with your caregiver to find out the results. Do not assume everything is normal if you have not heard from your caregiver or the medical facility. It is important for you to follow up on all test results. SEEK MEDICAL CARE IF:   There is back pain.   Your baby is older than 3 months with a rectal temperature of 100.5 F (38.1 C) or higher for more than 1 day.   Your or your child's problems (symptoms) are no better in 3 days. Return sooner if you or your child is getting worse.  SEEK IMMEDIATE MEDICAL CARE IF:   There is severe back pain or lower  abdominal pain.   You or your child develops chills.   You have a fever.   Your baby is older than 3 months with a rectal temperature of 102 F (38.9 C) or higher.   Your baby is 3 months old or younger with a rectal temperature of 100.4 F (38 C) or higher.   There is nausea or vomiting.   There is continued burning or discomfort with urination.  MAKE SURE YOU:   Understand these instructions.   Will watch your condition.   Will get help right away if you are not doing well or get worse.  Document Released: 09/23/2005 Document Revised: 12/03/2011 Document Reviewed: 04/28/2007 [employer] Patient Information 2012 [employer].Urinary Tract Infection Infections of the urinary tract can start in several places. A bladder infection (cystitis), a kidney infection (pyelonephritis), and a prostate infection (prostatitis) are different types of urinary tract infections (UTIs). They usually get better if treated with medicines (antibiotics) that kill germs. Take all the medicine until it is gone. You or your child may feel better in a few days, but TAKE ALL MEDICINE or the infection may not respond and may become more difficult to treat. HOME CARE INSTRUCTIONS   Drink enough water and fluids to keep the urine clear or pale yellow. Cranberry juice is especially recommended, in addition to large amounts of water.   Avoid caffeine, tea, and carbonated beverages. They tend to irritate the bladder.   Alcohol may irritate the prostate.   Only take   over-the-counter or prescription medicines for pain, discomfort, or fever as directed by your caregiver.  To prevent further infections:  Empty the bladder often. Avoid holding urine for long periods of time.   After a bowel movement, women should cleanse from front to back. Use each tissue only once.   Empty the bladder before and after sexual intercourse.  FINDING OUT THE RESULTS OF YOUR TEST Not all test results are available during your  visit. If your or your child's test results are not back during the visit, make an appointment with your caregiver to find out the results. Do not assume everything is normal if you have not heard from your caregiver or the medical facility. It is important for you to follow up on all test results. SEEK MEDICAL CARE IF:   There is back pain.   Your baby is older than 3 months with a rectal temperature of 100.5 F (38.1 C) or higher for more than 1 day.   Your or your child's problems (symptoms) are no better in 3 days. Return sooner if you or your child is getting worse.  SEEK IMMEDIATE MEDICAL CARE IF:   There is severe back pain or lower abdominal pain.   You or your child develops chills.   You have a fever.   Your baby is older than 3 months with a rectal temperature of 102 F (38.9 C) or higher.   Your baby is 3 months old or younger with a rectal temperature of 100.4 F (38 C) or higher.   There is nausea or vomiting.   There is continued burning or discomfort with urination.  MAKE SURE YOU:   Understand these instructions.   Will watch your condition.   Will get help right away if you are not doing well or get worse.  Document Released: 09/23/2005 Document Revised: 12/03/2011 Document Reviewed: 04/28/2007 [employer] Patient Information 2012 [employer]. 

## 2012-03-02 NOTE — Telephone Encounter (Signed)
Please call patient. I would like her to work on decreasing her caffeine intake and reducing her salt [phone] milligrams a day. Recheck blood pressure in one month. She can start to do this as a nurse visit. Also to make sure that her mammogram is up-to-date and she does have a family history of breast cancer. If she sees an OB and this is up-to-date then let me know when she had it so I can update our system. Also if she had a Pap smear let mek now and I will update that too.

## 2012-03-02 NOTE — Telephone Encounter (Signed)
Left message to call back  

## 2012-03-02 NOTE — Progress Notes (Signed)
  Subjective:    Patient ID: Michaela Dodson, female    DOB: [birth year], 41 y.o.   MRN: [id number]  HPI UTI - some low back pain, urgency, and odor with urine.  No hematuria or dysuria.  Says feel like can't hold urine very long. No fever.   Right Hand pain that wakes her up at night. Tingling during the daytime but much wose at night. Has tried a TENs unit. Wearing the splints at night. She is right handed. Starting to feel weak in her hand.  Hard time missing work.  No meds for pain. She has been using her left hand to compensate.   Review of Systems     Objective:   Physical Exam  Constitutional: She is oriented to person, place, and time. She appears well-developed and well-nourished.  HENT:  Head: Normocephalic and atraumatic.  Cardiovascular: Normal rate, regular rhythm and normal heart sounds.   Pulmonary/Chest: Effort normal and breath sounds normal.  Musculoskeletal:       Rt hand with neg tinnels but  + phalens sign. Finger grip and strength is 5/5 in both hands.  Wrist with NROM swelling.   Neurological: She is alert and oriented to person, place, and time.  Skin: Skin is warm and dry.  Psychiatric: She has a normal mood and affect. Her behavior is normal.          Assessment & Plan:  UTI - she is symptomatic that even that she is negative for nitrites and leukocytes she is positive for blood and elbow and treat her. I asked that when she is better and her UTI has resolved and has completed her antibiotics for one week out like to recheck her UA to evaluate for hematuria.  Carpal tunnel syndrome, right hand-recommend referral to sports medicine orthopedist for a possible injection. I think she would do well with this. Continue to wear night splints for now. She is not interested in surgery at this time.  Elevated BP - Repeat BP was still high. Recommmend dec salt, caffeine intake. She says seh has been drinking Pepsi Max which evidently has more caffeine in it. No NAIDS.  Recheck BP in one month.

## 2012-03-03 NOTE — Telephone Encounter (Signed)
Ok updated health maintenance.  I could refer her to Urology if she would like since has family hx of bladder cancer.

## 2012-03-03 NOTE — Telephone Encounter (Signed)
Referral placed.

## 2012-03-03 NOTE — Telephone Encounter (Signed)
Ok for urologist referral and pt will schedule a pap with [place] since last one we didn't get clear results

## 2012-03-03 NOTE — Telephone Encounter (Signed)
Notified pt of doctor's reccomendations; pt just had a mammogram and it was abnormal and then had a diagnostic and that was ok.Pt last pap was 2011 however there was too much blood in the specimen so it needs to be repeated.; pt also wanted you to know that her mother has had bladder cancer and she is concerned since pt has frequent UTI's . Wants to know if there is any futher eval that we need to do

## 2012-03-04 ENCOUNTER — Ambulatory Visit: Payer: Commercial Indemnity

## 2012-04-01 ENCOUNTER — Encounter: Payer: Self-pay | Admitting: *Deleted

## 2012-04-04 ENCOUNTER — Other Ambulatory Visit (HOSPITAL_COMMUNITY)
Admission: RE | Admit: 2012-04-04 | Discharge: 2012-04-04 | Disposition: A | Discharge status: Home / Self Care | Payer: Commercial Indemnity | Source: Ambulatory Visit | Attending: Family Medicine | Admitting: Family Medicine

## 2012-04-04 ENCOUNTER — Encounter: Payer: Self-pay | Admitting: Family Medicine

## 2012-04-04 ENCOUNTER — Ambulatory Visit (INDEPENDENT_AMBULATORY_CARE_PROVIDER_SITE_OTHER): Payer: Commercial Indemnity | Admitting: Family Medicine

## 2012-04-04 VITALS — BP 123/87 | HR 81 | Ht 68.0 in | Wt 184.0 lb

## 2012-04-04 DIAGNOSIS — Z13220 Encounter for screening for lipoid disorders: Secondary | ICD-10-CM

## 2012-04-04 DIAGNOSIS — Z1159 Encounter for screening for other viral diseases: Secondary | ICD-10-CM | POA: Insufficient documentation

## 2012-04-04 DIAGNOSIS — Z23 Encounter for immunization: Secondary | ICD-10-CM

## 2012-04-04 DIAGNOSIS — G5600 Carpal tunnel syndrome, unspecified upper limb: Secondary | ICD-10-CM

## 2012-04-04 DIAGNOSIS — Z01419 Encounter for gynecological examination (general) (routine) without abnormal findings: Secondary | ICD-10-CM | POA: Insufficient documentation

## 2012-04-04 DIAGNOSIS — Z0000 Encounter for general adult medical examination without abnormal findings: Secondary | ICD-10-CM

## 2012-04-04 DIAGNOSIS — Z124 Encounter for screening for malignant neoplasm of cervix: Secondary | ICD-10-CM

## 2012-04-04 NOTE — Patient Instructions (Signed)
-  We will call you with your pap smear results.

## 2012-04-04 NOTE — Progress Notes (Signed)
  Subjective:    Patient ID: Michaela Dodson, female    DOB: 1971-07-26, 41 y.o.   MRN: [id number]  HPI Here today for Pap smear only. She's not having any problems. She did want to update me and she does have carpal tunnel in both hands. She'll likely be getting surgery this summer. She also saw a urologist today for her incontinence issues.   Review of Systems     Objective:   Physical Exam  Constitutional: She appears well-developed and well-nourished.  Genitourinary: Vagina normal and uterus normal. There is no rash on the right labia. There is no rash on the left labia. Cervix exhibits no motion tenderness, no discharge and no friability. Right adnexum displays no mass, no tenderness and no fullness. Left adnexum displays no mass, no tenderness and no fullness. No bleeding around the vagina. No vaginal discharge found.          Assessment & Plan:  Pap smear- Will call with results.   Due for screening lipids and labs. Givne lab slip.   Updated problems list.   Pneumonia vaccine given today. She is a smoker and has asthma.

## 2012-05-25 LAB — COMPLETE METABOLIC PANEL WITH GFR
Albumin: 4.5 g/dL (ref 3.5–5.2)
Alkaline Phosphatase: 45 U/L (ref 39–117)
BUN: 11 mg/dL (ref 6–23)
Creat: 0.64 mg/dL (ref 0.50–1.10)
GFR, Est Non African American: >89 mL/min
Glucose, Bld: 90 mg/dL (ref 70–99)
Potassium: 4.8 mEq/L (ref 3.5–5.3)

## 2012-05-25 LAB — LIPID PANEL
Cholesterol: 165 mg/dL (ref 0–200)
HDL: 64 mg/dL (ref >39)
Total CHOL/HDL Ratio: 2.6 Ratio

## 2012-06-01 ENCOUNTER — Encounter: Payer: Self-pay | Admitting: Physician Assistant

## 2012-06-01 ENCOUNTER — Ambulatory Visit (INDEPENDENT_AMBULATORY_CARE_PROVIDER_SITE_OTHER): Payer: Commercial Indemnity | Admitting: Physician Assistant

## 2012-06-01 VITALS — BP 125/87 | HR 83 | Temp 98.2°F | Ht 68.0 in | Wt 189.0 lb

## 2012-06-01 DIAGNOSIS — L259 Unspecified contact dermatitis, unspecified cause: Secondary | ICD-10-CM

## 2012-06-01 MED ORDER — MUPIROCIN CALCIUM 2 % NA OINT: TOPICAL_OINTMENT | Freq: Two times a day (BID) | NASAL | Status: DC

## 2012-06-01 MED ORDER — SULFAMETHOXAZOLE-TRIMETHOPRIM 800-160 MG PO TABS: 1.0000 | ORAL_TABLET | Freq: Two times a day (BID) | ORAL | Status: AC

## 2012-06-04 NOTE — Patient Instructions (Signed)

## 2012-06-04 NOTE — Progress Notes (Signed)
  Subjective:    Patient ID: Michaela Dodson, female    DOB: Sep 03, 1971, 41 y.o.   MRN: [id number]  HPI Patient presents to the clinic with rash on the right side of her mid back and side for 5 days. It is very itching but no painful. Denies fever, chills, muscle ache, or SOB. Has put benadryl cream on the rash and has helped. No drainage or blistered notice. Not changed any detergent, started any new meds, been in contact with anything new but has been outside a lot more lately.    Review of Systems     Objective:   Physical Exam  Constitutional: She is oriented to person, place, and time. She appears well-developed and well-nourished.  HENT:  Head: Normocephalic and atraumatic.  Cardiovascular: Normal rate, regular rhythm and normal heart sounds.   Pulmonary/Chest: Effort normal and breath sounds normal. She has no wheezes.  Neurological: She is alert and oriented to person, place, and time.  Skin:       4-5 cluster papules with scabs at the center not open or draining on right middle back. Area on right side appeared to be an area of linear hives with tiny bumps throughout. Not open or draining.  Psychiatric: She has a normal mood and affect. Her behavior is normal.          Assessment & Plan:   Contact dermatitis- Can continue using benadryl cream on dermatitis. Start taking benadryl by mouth. Gave shot of depo medrol 80mg  today. Call if not improving.  Both of her children has hx of MRSA and are here for outbreak Bactrim for 10 days and Bactroban to place in nares for 5 days was given to take if started to have bumps break out on her.

## 2012-08-05 ENCOUNTER — Encounter: Payer: Self-pay | Admitting: Physician Assistant

## 2012-08-05 ENCOUNTER — Ambulatory Visit (INDEPENDENT_AMBULATORY_CARE_PROVIDER_SITE_OTHER): Payer: Commercial Indemnity | Admitting: Physician Assistant

## 2012-08-05 VITALS — BP 126/77 | HR 103 | Temp 97.7°F | Ht 68.0 in | Wt 190.0 lb

## 2012-08-05 DIAGNOSIS — L089 Local infection of the skin and subcutaneous tissue, unspecified: Secondary | ICD-10-CM

## 2012-08-05 DIAGNOSIS — R319 Hematuria, unspecified: Secondary | ICD-10-CM

## 2012-08-05 DIAGNOSIS — L0390 Cellulitis, unspecified: Secondary | ICD-10-CM

## 2012-08-05 DIAGNOSIS — L02419 Cutaneous abscess of limb, unspecified: Secondary | ICD-10-CM

## 2012-08-05 DIAGNOSIS — N399 Disorder of urinary system, unspecified: Secondary | ICD-10-CM

## 2012-08-05 LAB — POCT URINALYSIS DIPSTICK
Ketones, UA: NEGATIVE
Protein, UA: NEGATIVE
Spec Grav, UA: 1.025
pH, UA: 6.5

## 2012-08-05 MED ORDER — CEPHALEXIN 500 MG PO CAPS: 500.0000 mg | ORAL_CAPSULE | Freq: Four times a day (QID) | ORAL | Status: AC

## 2012-08-05 NOTE — Progress Notes (Addendum)
  Subjective:    Patient ID: Michaela Dodson, female    DOB: April 21, 1971, 41 y.o.   MRN: [id number]  HPI Pt presents to the clinic with 2 left leg bump that is red and swollen for 3 days. She has not tried anything to make better and it continues to get worse. Her husband and children have hx of MRSA. She has never had a diagnosed case of MRSA. She denies any fever, chills. She has noticed that her leg is hurting more over the past day and getting more red.  She also has had problems with urinary frequency and lower abdominal pressure but no pain. She has a hx of UTI and usually presents abnormally to clinic. Denies any pain with urination. She has hx of blood in urine and has had urology work up. This problem is ongoing.   Review of Systems     Objective:   Physical Exam  Constitutional: She is oriented to person, place, and time. She appears well-developed and well-nourished.  HENT:  Head: Normocephalic and atraumatic.  Pulmonary/Chest: She has wheezes.       NO CVA tenderness.  Abdominal: Soft. She exhibits no distension and no mass. There is no tenderness. There is no rebound and no guarding.  Neurological: She is alert and oriented to person, place, and time.  Skin:       Left leg above knee- small pustule with surrounding area of erythema 4inches by 4 inches. Swollen and warm to touch.  Left calf posterior- small pustule with surrounding area of erythema 5x3. Swollen and warm to touch.  Psychiatric: She has a normal mood and affect. Her behavior is normal.          Assessment & Plan:  Cellutitis/pustule- Sent culture for left calf posterior. Sent over keflex 4x a day for 7 days. Will call with culture results. Use tylenol or advil for pain and swelling. Encouraged patient to use ice packs on area and elevate feet. Has bactroban at home and encouraged patient to place that on pustules. If not improving call office.  Urinary problems/hematuria unspecified- UA- positive for blood  and negative for leuks and nitrates. Will send for culture. Encouraged pt at this time to stay hydrated. If not improving call office.

## 2012-08-05 NOTE — Patient Instructions (Addendum)
Will call with culture results. Will call with urine dipstick results.   Cellulitis Cellulitis is an infection of the skin and the tissue beneath it. The area is typically red and tender. It is caused by germs (bacteria) (usually staph or strep) that enter the body through cuts or sores. Cellulitis most commonly occurs in the arms or lower legs.  HOME CARE INSTRUCTIONS   If you are given a prescription for medications which kill germs (antibiotics), take as directed until finished.   If the infection is on the arm or leg, keep the limb elevated as able.   Use a warm cloth several times per day to relieve pain and encourage healing.   See your caregiver for recheck of the infected site as directed if problems arise.   Only take over-the-counter or prescription medicines for pain, discomfort, or fever as directed by your caregiver.  SEEK MEDICAL CARE IF:   The area of redness (inflammation) is spreading, there are red streaks coming from the infected site, or if a part of the infection begins to turn dark in color.   The joint or bone underneath the infected skin becomes painful after the skin has healed.   The infection returns in the same or another area after it seems to have gone away.   A boil or bump swells up. This may be an abscess.   New, unexplained problems such as pain or fever develop.  SEEK IMMEDIATE MEDICAL CARE IF:   You have a fever.   You or your child feels drowsy or lethargic.   There is vomiting, diarrhea, or lasting discomfort or feeling ill (malaise) with muscle aches and pains.  MAKE SURE YOU:   Understand these instructions.   Will watch your condition.   Will get help right away if you are not doing well or get worse.  Document Released: 09/23/2005 Document Revised: 12/03/2011 Document Reviewed: 08/01/2008 [facility] Patient Information 2012 [place], [place].

## 2012-08-08 ENCOUNTER — Other Ambulatory Visit: Payer: Self-pay | Admitting: Physician Assistant

## 2012-08-08 LAB — WOUND CULTURE: Gram Stain: NONE SEEN

## 2012-08-08 MED ORDER — CIPROFLOXACIN HCL 500 MG PO TABS: 500.0000 mg | ORAL_TABLET | Freq: Two times a day (BID) | ORAL | Status: AC

## 2012-09-27 HISTORY — PX: CARPAL TUNNEL RELEASE: SHX101

## 2013-02-05 ENCOUNTER — Emergency Department (INDEPENDENT_AMBULATORY_CARE_PROVIDER_SITE_OTHER)
Admission: EM | Admit: 2013-02-05 | Discharge: 2013-02-05 | Disposition: A | Discharge status: Home / Self Care | Payer: Commercial Indemnity | Source: Home / Self Care | Attending: Family Medicine | Admitting: Family Medicine

## 2013-02-05 DIAGNOSIS — J069 Acute upper respiratory infection, unspecified: Secondary | ICD-10-CM

## 2013-02-05 DIAGNOSIS — J329 Chronic sinusitis, unspecified: Secondary | ICD-10-CM

## 2013-02-05 MED ORDER — AMOXICILLIN-POT CLAVULANATE 875-125 MG PO TABS: 1.0000 | ORAL_TABLET | Freq: Two times a day (BID) | ORAL | Status: DC

## 2013-02-05 NOTE — ED Notes (Signed)
Sinus headache started Friday

## 2013-02-05 NOTE — ED Provider Notes (Signed)
History     CSN: [id number]  Arrival date & time 02/05/13  1214   First MD Initiated Contact with Patient 02/05/13 1239      Chief Complaint  Patient presents with  . Facial Pain   HPI  SINUSITIS Onset:  2-3 days  Location: maxillary sinuses Description:sinus pressure, nasal congestion, mild cough   Modifying factors: none   Symptoms Cough:  yes Discharge:  yes Fever: no Sinus Pressure:  yes Ears Blocked:  no Teeth Ache:  mild Frontal Headache:  yes Second Sickening:  no  Red Flags Change in mental state: no Change in vision: no    Past Medical History  Diagnosis Date  . Allergy   . Depression   . GERD (gastroesophageal reflux disease)   . Obesity   . H/O thromboembolism     superficial-left medial thigh lipoma, vericose veins    Past Surgical History  Procedure Laterality Date  . Tubal ligation    . Breast biopsy    . Uterine ablation      Family History  Problem Relation Age of Onset  . Cancer Mother     breast and bladder CA    History  Substance Use Topics  . Smoking status: Current Every Day Smoker    Types: Cigarettes  . Smokeless tobacco: Not on file     Comment: She has been cutting back.   . Alcohol Use: Yes    OB History   Grav Para Term Preterm Abortions TAB SAB Ect Mult Living                  Review of Systems  All other systems reviewed and are negative.    Allergies  Review of patient's allergies indicates no known allergies.  Home Medications   Current Outpatient Rx  Name  Route  Sig  Dispense  Refill  . amoxicillin-clavulanate (AUGMENTIN) 875-125 MG per tablet   Oral   Take 1 tablet by mouth 2 (two) times daily.   20 tablet   0   . EXPIRED: mupirocin nasal ointment (BACTROBAN NASAL) 2 %   Nasal   Place into the nose 2 (two) times daily. Use one-half of tube in each nostril twice daily for five (5) days. After application, press sides of nose together and gently massage.   10 g   0   . phentermine 37.5 MG  capsule   Oral   Take 37.5 mg by mouth every morning.           BP 120/75  Pulse 86  Temp(Src) 98.5 F (36.9 C) (Oral)  Ht 5\' 8"  (1.727 m)  Wt 205 lb (92.987 kg)  BMI 31.18 kg/m2  SpO2 100%  Physical Exam  Constitutional: She appears well-developed and well-nourished.  HENT:  Head: Normocephalic and atraumatic.  Right Ear: External ear normal.  Left Ear: External ear normal.  +nasal erythema, rhinorrhea bilaterally, + post oropharyngeal erythema  Mild maxillary TTP bilaterally    Eyes: Conjunctivae are normal. Pupils are equal, round, and reactive to light.  Neck: Normal range of motion. Neck supple.  Cardiovascular: Normal rate, regular rhythm and normal heart sounds.   Pulmonary/Chest: Effort normal and breath sounds normal.  Abdominal: Soft.  Musculoskeletal: Normal range of motion.  Neurological: She is alert.  Skin: Skin is warm.    ED Course  Procedures (including critical care time)  Labs Reviewed - No data to display No results found.   1. URI (upper respiratory infection)   2.  Sinusitis       MDM  Suspect likely viral process. Discussed supportive care and infectious red flags. Prophylactic prescription of Augmentin was given to patient symptoms persist >7-10 days. This was discussed with patient. Otherwise followup as needed.     The patient and/or caregiver has been counseled thoroughly with regard to treatment plan and/or medications prescribed including dosage, schedule, interactions, rationale for use, and possible side effects and they verbalize understanding. Diagnoses and expected course of recovery discussed and will return if not improved as expected or if the condition worsens. Patient and/or caregiver verbalized understanding.             Doree Albee, MD 02/05/13 1807

## 2013-05-02 ENCOUNTER — Other Ambulatory Visit: Payer: Self-pay | Admitting: Family Medicine

## 2013-05-02 ENCOUNTER — Telehealth: Payer: Self-pay | Admitting: *Deleted

## 2013-05-02 ENCOUNTER — Other Ambulatory Visit (INDEPENDENT_AMBULATORY_CARE_PROVIDER_SITE_OTHER): Payer: Commercial Indemnity | Admitting: *Deleted

## 2013-05-02 DIAGNOSIS — N3941 Urge incontinence: Secondary | ICD-10-CM

## 2013-05-02 LAB — POCT URINALYSIS DIPSTICK
Bilirubin, UA: NEGATIVE
Nitrite, UA: POSITIVE
pH, UA: 8

## 2013-05-02 MED ORDER — CIPROFLOXACIN HCL 500 MG PO TABS: 500.0000 mg | ORAL_TABLET | Freq: Two times a day (BID) | ORAL | Status: AC

## 2013-05-02 NOTE — Telephone Encounter (Signed)
Pt called and informed of results of urine and that Dr. Linford Arnold has called in an Rx for Cipro for her to take twice a day x 3 days.Laureen Ochs, Viann Shove

## 2013-05-04 LAB — URINE CULTURE

## 2013-05-12 ENCOUNTER — Other Ambulatory Visit: Payer: Self-pay | Admitting: *Deleted

## 2013-05-12 ENCOUNTER — Other Ambulatory Visit (INDEPENDENT_AMBULATORY_CARE_PROVIDER_SITE_OTHER): Payer: Commercial Indemnity | Admitting: Family Medicine

## 2013-05-12 DIAGNOSIS — M545 Low back pain, unspecified: Secondary | ICD-10-CM

## 2013-05-12 DIAGNOSIS — N390 Urinary tract infection, site not specified: Secondary | ICD-10-CM

## 2013-05-12 DIAGNOSIS — N3941 Urge incontinence: Secondary | ICD-10-CM

## 2013-05-12 LAB — POCT URINALYSIS DIPSTICK
Bilirubin, UA: NEGATIVE
Glucose, UA: NEGATIVE
Leukocytes, UA: NEGATIVE
Nitrite, UA: NEGATIVE

## 2013-05-12 NOTE — Addendum Note (Signed)
Addended by: Deno Etienne on: 05/12/2013 12:40 PM   Modules accepted: Orders

## 2013-05-12 NOTE — Progress Notes (Signed)
  Subjective:    Patient ID: Michaela Dodson, female    DOB: October 26, 1971, 42 y.o.   MRN: [id number]  HPI Here for repeat UA.  Had low back pain and UTI. She can she completed her antibiotics but says she still having some low back pain. She wants to make sure that her UTI is clear.   Review of Systems     Objective:   Physical Exam        Assessment & Plan:  UTI-urinalysis is negative for nitrates and leukocytes but it is positive for blood so at like to send a repeat culture just to confirm that we have completely cleared the infection.  Low back pain-if the urine culture is negative then she may need to be seen for back pain.  Nani Gasser, MD

## 2013-05-14 LAB — URINE CULTURE: Colony Count: 9000

## 2013-06-06 ENCOUNTER — Encounter: Payer: Self-pay | Admitting: Family Medicine

## 2013-06-06 ENCOUNTER — Other Ambulatory Visit: Payer: Self-pay | Admitting: Family Medicine

## 2013-06-06 ENCOUNTER — Ambulatory Visit (INDEPENDENT_AMBULATORY_CARE_PROVIDER_SITE_OTHER): Payer: Commercial Indemnity | Admitting: Family Medicine

## 2013-06-06 VITALS — BP 130/89 | HR 85 | Temp 98.0°F | Wt 208.0 lb

## 2013-06-06 DIAGNOSIS — E669 Obesity, unspecified: Secondary | ICD-10-CM

## 2013-06-06 DIAGNOSIS — R319 Hematuria, unspecified: Secondary | ICD-10-CM

## 2013-06-06 DIAGNOSIS — R3129 Other microscopic hematuria: Secondary | ICD-10-CM | POA: Insufficient documentation

## 2013-06-06 DIAGNOSIS — L259 Unspecified contact dermatitis, unspecified cause: Secondary | ICD-10-CM

## 2013-06-06 DIAGNOSIS — L0390 Cellulitis, unspecified: Secondary | ICD-10-CM

## 2013-06-06 DIAGNOSIS — L0291 Cutaneous abscess, unspecified: Secondary | ICD-10-CM

## 2013-06-06 LAB — URINALYSIS, ROUTINE W REFLEX MICROSCOPIC
Bilirubin Urine: NEGATIVE
Ketones, ur: NEGATIVE mg/dL
Protein, ur: NEGATIVE mg/dL
Urobilinogen, UA: 0.2 mg/dL (ref 0.0–1.0)

## 2013-06-06 LAB — URINALYSIS, MICROSCOPIC ONLY: Bacteria, UA: NONE SEEN

## 2013-06-06 MED ORDER — TRIAMCINOLONE ACETONIDE 0.1 % EX CREA: TOPICAL_CREAM | Freq: Two times a day (BID) | CUTANEOUS | Status: DC

## 2013-06-06 MED ORDER — SULFAMETHOXAZOLE-TRIMETHOPRIM 800-160 MG PO TABS: ORAL_TABLET | ORAL | Status: AC

## 2013-06-06 NOTE — Progress Notes (Signed)
CC: Michaela Dodson is a 42 y.o. female is here for rash on the left elbow   Subjective: HPI:  Left elbow rash. Has been present for 2 days. Worsening on a daily basis. Moderate itching with mild pain when scratching. No improvement with calamine lotion or Benadryl. No other interventions. Discomfort and pain is nonradiating. Pain is described only has pain. Present all hours of the day. Nothing else makes better or worse other than above. Denies known trauma or skin abnormalities elsewhere. Denies fevers, chills, rapid heartbeat, fatigue, swollen lymph nodes  Complains of low back pain present for the last month. Over the past week this is been accompanied by urinary frequency and questionable gross blood in urine. Last urine culture was negative with moderate blood on dipstick. She denies flank pain, nausea, abdominal pain, pelvic pain. CT scan one year ago showing nonobstructive nephrolithiasis however no other genitourinary or renal abnormalities.  Family history significant for bladder cancer. She denies unintentional weight loss, bleeding abnormalities, bruising issues.  Complains of trouble losing weight over the past year. Has done great on phentermine in the past. Is requesting refill on this today.   Review Of Systems Outlined In HPI  Past Medical History  Diagnosis Date  . Allergy   . Depression   . GERD (gastroesophageal reflux disease)   . Obesity   . H/O thromboembolism     superficial-left medial thigh lipoma, vericose veins     Family History  Problem Relation Age of Onset  . Cancer Mother     breast and bladder CA     History  Substance Use Topics  . Smoking status: Current Every Day Smoker    Types: Cigarettes  . Smokeless tobacco: Not on file     Comment: She has been cutting back.   . Alcohol Use: Yes     Objective: Filed Vitals:   06/06/13 0955  BP: 130/89  Pulse: 85  Temp: 98 F (36.7 C)    Vital signs reviewed. General: Alert and Oriented, No  Acute Distress HEENT: Pupils equal, round, reactive to light. Conjunctivae clear.  External ears unremarkable.  Moist mucous membranes. Lungs: Clear and comfortable work of breathing, speaking in full sentences without accessory muscle use. Cardiac: Regular rate and rhythm.  Neuro: CN II-XII grossly intact, gait normal. Extremities: No peripheral edema.  Strong peripheral pulses.  Mental Status: No depression, anxiety, nor agitation. Logical though process. Skin: Warm and dry. Left extensor surface elbow 2 cm x 3 cm elliptical slightly raised erythematous patch with well-defined borders with nearby excoriations tender to touch  Assessment & Plan: Michaela Dodson was seen today for rash on the left elbow.  Diagnoses and associated orders for this visit:  Hematuria - Urine culture - Urinalysis, Routine w reflex microscopic  Contact dermatitis - triamcinolone cream (KENALOG) 0.1 %; Apply topically 2 (two) times daily. For two weeks. To left elbow.  Cellulitis - sulfamethoxazole-trimethoprim (SEPTRA DS) 800-160 MG per tablet; One by mouth twice a day for ten days.  Obesity    Left elbow lesion looks like contact dermatitis with possibility of staph or strep infection. We'll treat inflammation with triamcinolone and cover for bacteria with Bactrim. Obesity: Have asked her to followup with her PCP to discuss prescription control medication such as phentermine Hematuria: I do not see quantification of microscopic red blood cells Will order microscopy and repeat culture to help determine if urine cytology  and cystoscopy is needed at this time  Return if symptoms worsen or fail  to improve.

## 2013-06-07 LAB — URINE CULTURE: Colony Count: NO GROWTH

## 2013-06-08 ENCOUNTER — Encounter: Payer: Self-pay | Admitting: Family Medicine

## 2013-06-08 ENCOUNTER — Ambulatory Visit (INDEPENDENT_AMBULATORY_CARE_PROVIDER_SITE_OTHER): Payer: Commercial Indemnity | Admitting: Family Medicine

## 2013-06-08 VITALS — BP 122/77 | HR 80 | Wt 207.0 lb

## 2013-06-08 DIAGNOSIS — R3129 Other microscopic hematuria: Secondary | ICD-10-CM

## 2013-06-08 DIAGNOSIS — R635 Abnormal weight gain: Secondary | ICD-10-CM

## 2013-06-08 MED ORDER — PHENTERMINE HCL 37.5 MG PO CAPS: 37.5000 mg | ORAL_CAPSULE | ORAL | Status: DC

## 2013-06-08 NOTE — Progress Notes (Signed)
  Subjective:    Patient ID: Michaela Dodson, female    DOB: 09/09/1971, 42 y.o.   MRN: [id number]  HPI Did well on phentermine and had lost 75lb and now has gained 25lbs back lately.  She says she has gotten lazy.  She  really want to lose weight for a wedding.  No CP or SOB.  No cardiac issues.  Has been doing skakes and they are really healthy.    Review of Systems     Objective:   Physical Exam  Constitutional: She is oriented to person, place, and time. She appears well-developed and well-nourished.  HENT:  Head: Normocephalic and atraumatic.  Cardiovascular: Normal rate, regular rhythm and normal heart sounds.   Pulmonary/Chest: Effort normal and breath sounds normal.  Neurological: She is alert and oriented to person, place, and time.  Skin: Skin is warm and dry.  Psychiatric: She has a normal mood and affect. Her behavior is normal.          Assessment & Plan:  Abnormal weight gain - Will restart phentermine. Discussed potential side effects. Stop immediately if any chest pain or shortness of breath or palpitations. F/Uin 1 mo for BP and weight check with the nurse. Every third visit she will need to see me so that we can make sure she's tolerating it well 2 heart exam and Requip as far as diet and exercise changes. She plans on doing a shake diet and does plan on starting to exercise again. Followup in one month.  Microscopic hematuria-she has had persistent microscopic hematuria. Her last culture was negative. She has seen urology about a year ago and had a good evaluation at that time but she wasn't sure if it was for kidney stones or for the hematuria. She will sign a release of records and have that information faxed to our office as I have no copies of her visits to urology.

## 2013-07-12 ENCOUNTER — Ambulatory Visit (INDEPENDENT_AMBULATORY_CARE_PROVIDER_SITE_OTHER): Payer: Commercial Indemnity | Admitting: Family Medicine

## 2013-07-12 ENCOUNTER — Encounter: Payer: Self-pay | Admitting: Family Medicine

## 2013-07-12 VITALS — BP 141/82 | HR 98 | Wt 209.0 lb

## 2013-07-12 DIAGNOSIS — R635 Abnormal weight gain: Secondary | ICD-10-CM

## 2013-07-12 DIAGNOSIS — R3129 Other microscopic hematuria: Secondary | ICD-10-CM

## 2013-07-12 DIAGNOSIS — R8290 Unspecified abnormal findings in urine: Secondary | ICD-10-CM

## 2013-07-12 DIAGNOSIS — R82998 Other abnormal findings in urine: Secondary | ICD-10-CM

## 2013-07-12 DIAGNOSIS — N390 Urinary tract infection, site not specified: Secondary | ICD-10-CM

## 2013-07-12 LAB — POCT URINALYSIS DIPSTICK
Glucose, UA: NEGATIVE
Nitrite, UA: NEGATIVE
Protein, UA: NEGATIVE
Urobilinogen, UA: 0.2

## 2013-07-12 MED ORDER — CIPROFLOXACIN HCL 500 MG PO TABS: 500.0000 mg | ORAL_TABLET | Freq: Two times a day (BID) | ORAL | Status: DC

## 2013-07-12 MED ORDER — PHENTERMINE HCL 37.5 MG PO CAPS: 37.5000 mg | ORAL_CAPSULE | ORAL | Status: DC

## 2013-07-12 NOTE — Patient Instructions (Signed)
Urinary Tract Infection °A urinary tract infection (UTI) can occur any place along the urinary tract. The tract includes the kidneys, ureters, bladder, and urethra. A type of germ called bacteria often causes a UTI. UTIs are often helped with antibiotic medicine.  °HOME CARE  °· If given, take antibiotics as told by your doctor. Finish them even if you start to feel better. °· Drink enough fluids to keep your pee (urine) clear or pale yellow. °· Avoid tea, drinks with caffeine, and bubbly (carbonated) drinks. °· Pee often. Avoid holding your pee in for a long time. °· Pee before and after having sex (intercourse). °· Wipe from front to back after you poop (bowel movement) if you are a woman. Use each tissue only once. °GET HELP RIGHT AWAY IF:  °· You have back pain. °· You have lower belly (abdominal) pain. °· You have chills. °· You feel sick to your stomach (nauseous). °· You throw up (vomit). °· Your burning or discomfort with peeing does not go away. °· You have a fever. °· Your symptoms are not better in 3 days. °MAKE SURE YOU:  °· Understand these instructions. °· Will watch your condition. °· Will get help right away if you are not doing well or get worse. °Document Released: 06/01/2008 Document Revised: 09/07/2012 Document Reviewed: 07/14/2012 °[employer]® Patient Information ©2014 [employer]. ° °

## 2013-07-12 NOTE — Progress Notes (Signed)
  Subjective:    Patient ID: Michaela Dodson, female    DOB: 26-Jun-1971, 42 y.o.   MRN: [id number]  HPI Abnormal urine odor x 2 days and some leaking.  No fever or back pain. No dysuria.  No gross hematuria. She has not taking anything over-the-counter. Says this is typical course for her urinary tract infections.  Abnormal weight gain - She hasn't been able to eat at home bc had a pipe burst and have house fixed so hasn't been able to cook. . Says hse feels swollen today. She has not been exercising as well as she reports she's been so busy with trying to get her house repaired that she has not had time. She says even her husband has gained a few pounds. Review of Systems     Objective:   Physical Exam  Constitutional: She is oriented to person, place, and time. She appears well-developed and well-nourished.  HENT:  Head: Normocephalic and atraumatic.  Cardiovascular: Normal rate, regular rhythm and normal heart sounds.   Pulmonary/Chest: Effort normal and breath sounds normal.  Neurological: She is alert and oriented to person, place, and time.  Skin: Skin is warm and dry.  Psychiatric: She has a normal mood and affect. Her behavior is normal.          Assessment & Plan:  UTI - will treat with Cipro for 3 days. Will send culture and call the results once available.  Abnormal weight gain - her weight is up actual 2 pounds. I did go ahead and refill her phentermine today recommended that she hold off on starting it again until she gets her house repaired and is actually able to really work on diet and exercise in conjunction with taking the medication. She agrees. Followup in one month for blood pressure and weight check. Explained her that she does not lose weight that we will not refill the medication.  Next hematuria-we have faxed over request for records to her neurologist. She was last seen her about a year ago. Unfortunately have not received anything from them. We will call her  office again today. I am interested to see if she had a workup for the kidney stones or further hematuria. If she has not been we need to send her back for further workup. It does sound like she may have had some type of  cystoscopy

## 2013-08-18 ENCOUNTER — Ambulatory Visit (INDEPENDENT_AMBULATORY_CARE_PROVIDER_SITE_OTHER): Payer: Commercial Indemnity | Admitting: Family Medicine

## 2013-08-18 ENCOUNTER — Encounter: Payer: Self-pay | Admitting: Family Medicine

## 2013-08-18 VITALS — BP 127/86 | HR 96 | Wt 201.0 lb

## 2013-08-18 DIAGNOSIS — R319 Hematuria, unspecified: Secondary | ICD-10-CM

## 2013-08-18 DIAGNOSIS — Z8052 Family history of malignant neoplasm of bladder: Secondary | ICD-10-CM

## 2013-08-18 DIAGNOSIS — L905 Scar conditions and fibrosis of skin: Secondary | ICD-10-CM

## 2013-08-18 DIAGNOSIS — R635 Abnormal weight gain: Secondary | ICD-10-CM

## 2013-08-18 DIAGNOSIS — M549 Dorsalgia, unspecified: Secondary | ICD-10-CM

## 2013-08-18 DIAGNOSIS — G8929 Other chronic pain: Secondary | ICD-10-CM

## 2013-08-18 MED ORDER — PHENTERMINE HCL 37.5 MG PO CAPS: 37.5000 mg | ORAL_CAPSULE | ORAL | Status: DC

## 2013-08-18 NOTE — Progress Notes (Signed)
  Subjective:    Patient ID: Michaela Dodson, female    DOB: [birth year], 42 y.o.   MRN: [id number]  HPI Abnormal weight gain-she's doing on the phentermine without any problems or side effects. She would like a refill today. She has lost 8 pounds.  Still doing her shakes in the morning.  Eating a light lunch.    Chronic hematuria-she's never had a workup for this. She did see urology quit sometime ago for kidney stones. We tried to call couple times to get off his meds but have been unable to get them.  Would like a prescription for a compounded cream for her chronic back pain. She would also like a scar cream. She brought in the order form. The pain exam has gabapentin 6%, clonidine 0.2%, PERRLA can 7%, baclofen 3%, and diclofenac 2%.  It is called Neuromax.    Review of Systems     Objective:   Physical Exam  Constitutional: She is oriented to person, place, and time. She appears well-developed and well-nourished.  HENT:  Head: Normocephalic and atraumatic.  Cardiovascular: Normal rate, regular rhythm and normal heart sounds.   Pulmonary/Chest: Effort normal and breath sounds normal.  Neurological: She is alert and oriented to person, place, and time.  Skin: Skin is warm and dry.  Psychiatric: She has a normal mood and affect. Her behavior is normal.          Assessment & Plan:  Abnormal weight gain- tolerating phentermine well. A pound weight loss. Blood pressure looks great today. Refill for 30 days. Followup in one month for blood pressure weight check w/ nurse.  Hematuria-we'll make referral back to her urologist, Dr. Beverely Pace for further workup. Her mother does have a history of bladder cancer.  Chronic back pain-I would definitely low for her to try a topical pain cream. I think these can be very helpful and have fewer side effects than taking them orally.  Scar over her nose and wrists. We'll try the topical scar cream. We'll fax.

## 2013-08-22 ENCOUNTER — Other Ambulatory Visit: Payer: Self-pay | Admitting: Family Medicine

## 2013-08-22 LAB — URINE CULTURE: Colony Count: 60000

## 2013-08-22 MED ORDER — CIPROFLOXACIN HCL 500 MG PO TABS: 500.0000 mg | ORAL_TABLET | Freq: Two times a day (BID) | ORAL | Status: AC

## 2013-09-20 ENCOUNTER — Ambulatory Visit (INDEPENDENT_AMBULATORY_CARE_PROVIDER_SITE_OTHER): Payer: Commercial Indemnity | Admitting: Family Medicine

## 2013-09-20 ENCOUNTER — Encounter: Payer: Self-pay | Admitting: Family Medicine

## 2013-09-20 VITALS — BP 144/86 | HR 96 | Wt 202.0 lb

## 2013-09-20 DIAGNOSIS — R635 Abnormal weight gain: Secondary | ICD-10-CM

## 2013-09-20 MED ORDER — PHENTERMINE HCL 37.5 MG PO CAPS: 37.5000 mg | ORAL_CAPSULE | ORAL | Status: DC

## 2013-09-20 NOTE — Progress Notes (Signed)
  Subjective:    Patient ID: Michaela Dodson, female    DOB: 1971/02/04, 42 y.o.   MRN: [id number]  HPI She is here for weight loss. She is really stressed.  She is in a wedding in 3 days. Has been more active. Has cut out snacks and junk foods.  She is not actively exercising right now.  She is working on a wedding that i son Sat and is really stressed and anxious today.  She has noticed that her clothes are fitting a lot better and has not even had people comment that she looks like she's lost weight. She denies any chest pain or short of breath or palpitations when she takes the medication. Otherwise tolerates that well.    Review of Systems     Objective:   Physical Exam  Constitutional: She is oriented to person, place, and time. She appears well-developed and well-nourished.  HENT:  Head: Normocephalic and atraumatic.  Cardiovascular: Normal rate, regular rhythm and normal heart sounds.   Pulmonary/Chest: Effort normal and breath sounds normal.  Neurological: She is alert and oriented to person, place, and time.  Skin: Skin is warm and dry.  Psychiatric: She has a normal mood and affect. Her behavior is normal.          Assessment & Plan:  Abnormal weight gain - it sounds like she's done a great job with her dietary changes. We discussed the next he is to really work on getting any regular exercise. I think if she's able to do that she'll make some great strides with her weight loss. She's also been really stressed recently. We discussed different options including refill on the phentermine for one more month and really try to focus on exercise versus switching to another weight loss medication such as Belviq or Qsymia does it is strongly recommended to have an exercise program and regimen with these medications as well. She also should try refilling the phentermine for one more month. Followup in one month for nurse blood pressure and weight check.    Time spent approximately  15-20 minutes, greater than 50% time spent counseling about strategies for weight loss.

## 2013-10-13 ENCOUNTER — Telehealth: Payer: Self-pay

## 2013-10-13 NOTE — Telephone Encounter (Signed)
That is definitely possible. Only we will be able to tell us if she just quit using them and if the UTIs resolve.

## 2013-10-13 NOTE — Telephone Encounter (Signed)
Michaela Dodson has had frequent UTI's. She states the [employer] wipes were recalled due to bacteria. She uses them daily. Could this be the cause of her infections?

## 2013-10-13 NOTE — Telephone Encounter (Signed)
Patient advised.

## 2013-10-25 ENCOUNTER — Encounter: Payer: Self-pay | Admitting: Family Medicine

## 2013-10-25 ENCOUNTER — Telehealth: Payer: Self-pay | Admitting: *Deleted

## 2013-10-25 ENCOUNTER — Ambulatory Visit (INDEPENDENT_AMBULATORY_CARE_PROVIDER_SITE_OTHER): Payer: Commercial Indemnity | Admitting: Family Medicine

## 2013-10-25 VITALS — BP 128/86 | HR 111 | Wt 203.0 lb

## 2013-10-25 DIAGNOSIS — R635 Abnormal weight gain: Secondary | ICD-10-CM

## 2013-10-25 MED ORDER — PHENTERMINE-TOPIRAMATE ER 3.75-23 MG PO CP24: 1.0000 | ORAL_CAPSULE | Freq: Every morning | ORAL | Status: DC

## 2013-10-25 MED ORDER — PHENTERMINE-TOPIRAMATE ER 7.5-46 MG PO CP24: 1.0000 | ORAL_CAPSULE | Freq: Every morning | ORAL | Status: DC

## 2013-10-25 NOTE — Patient Instructions (Signed)
Still encouraged 30 min of moderate exercise 5 days per week for weight loss.   Let me know when you are ready for nutrition referral.

## 2013-10-25 NOTE — Progress Notes (Signed)
  Subjective:    Patient ID: Michaela Dodson, female    DOB: 02/24/1971, 42 y.o.   MRN: [id number]  HPI Has cut out soda completely. She was previously drinking for feet today. She now only drinks water with a little bit of flavoring.Marland Kitchen  Has been doing shakes in the AM.  She is now working for her job, she cleans houses. She has not set exercise regimen..  She is frustrated because she's actually gained a pound today. She is otherwise tolerating the phentermine well without any side effects. No chest pain, palpitations.   Review of Systems     Objective:   Physical Exam  Constitutional: She is oriented to person, place, and time. She appears well-developed and well-nourished.  HENT:  Head: Normocephalic and atraumatic.  Cardiovascular: Normal rate, regular rhythm and normal heart sounds.   Pulmonary/Chest: Effort normal and breath sounds normal.  Neurological: She is alert and oriented to person, place, and time.  Skin: Skin is warm and dry.  Psychiatric: She has a normal mood and affect. Her behavior is normal.          Assessment & Plan:  Abnormal weight gain-discussed the need for regular exercise. Is extremely important to bee sting her metabolism. Also recommend a nutrition referral. She wants to think about this. We will also try changing her to Qsymia. She has her tubes ties so no risk of pregnancy.  F/u in 1 months.  Given coupon card.  She has made some great dietaary changes.  Will check thyroid toda as well.    Time spent 20 min, > 50% spent counseling on weight loss

## 2013-10-25 NOTE — Telephone Encounter (Signed)
PA obtained for Qsymia 3.75-23mg . Approval good thru 04/23/2014. When pt's dose is increased, we must call [phone] and change dosage strength so that it will be covered. Case ID # is [id number].

## 2013-10-25 NOTE — Telephone Encounter (Signed)
Pt stated that there wasn't a coupon card in the Qsymia sample. And had questions about taking this due to her thyroid issues please advise.Michaela Dodson

## 2013-10-26 NOTE — Progress Notes (Signed)
Quick Note:  All labs are normal. ______ 

## 2013-10-26 NOTE — Telephone Encounter (Signed)
There was a folded up 8 x 11 piece of paper inside that basically said that she can get 14 days for free. It was an actual hard. But it was inside the paperwork as I verified that it was there before I gave it to her. As far as the thyroid it should not be an issue but if she loses weight on the medication then we will have to check her thyroid more frequently

## 2013-10-26 NOTE — Telephone Encounter (Signed)
Pt called pt and informed her of dr.metheney's recommendations. Pt voiced understanding.Michaela Dodson

## 2013-11-27 HISTORY — PX: CARPAL TUNNEL RELEASE: SHX101

## 2013-12-18 LAB — HEPATIC FUNCTION PANEL
ALT: 23 U/L (ref 7–35)
AST: 15 U/L (ref 13–35)
Alkaline Phosphatase: 52 U/L (ref 25–125)
BILIRUBIN, TOTAL: 1.5 mg/dL

## 2013-12-18 LAB — BASIC METABOLIC PANEL
BUN: 10 mg/dL (ref 4–21)
Creatinine: 0.6 mg/dL (ref 0.5–1.1)
GLUCOSE: 93 mg/dL
Potassium: 4.3 mmol/L (ref 3.4–5.3)
SODIUM: 141 mmol/L (ref 137–147)

## 2013-12-18 LAB — CBC AND DIFFERENTIAL
HEMOGLOBIN: 14.2 g/dL (ref 12.0–16.0)
WBC: 8.3 10*3/mL

## 2014-01-16 ENCOUNTER — Encounter: Payer: Self-pay | Admitting: *Deleted

## 2014-01-22 ENCOUNTER — Telehealth: Payer: Self-pay | Admitting: *Deleted

## 2014-01-22 NOTE — Telephone Encounter (Signed)
Pt wanted to know if you have received the results of her labs that were done at Dr[facility]. She is asking because she was told that her liver was high. I told her that we did have these results. She is no longer taking the Depakote she now take abilify. She also wanted to know if you would write rx for spirolactone for  facial hair she has tried other methods and still has problems with this.

## 2014-01-23 NOTE — Telephone Encounter (Signed)
Can schedule f/u to discuss spironolactine . Her liver enyzmes are normal.  Her bilirubin is up a litle but that is not uncommon. We can recheck at f/u appt to make sure.

## 2014-01-23 NOTE — Telephone Encounter (Signed)
Pt told that she will need to make an appt to discuss med and informed of her results.Michaela Dodson

## 2014-01-24 ENCOUNTER — Ambulatory Visit (INDEPENDENT_AMBULATORY_CARE_PROVIDER_SITE_OTHER): Payer: BC Managed Care – PPO | Admitting: Family Medicine

## 2014-01-24 ENCOUNTER — Encounter: Payer: Self-pay | Admitting: Family Medicine

## 2014-01-24 VITALS — BP 113/73 | HR 97 | Temp 97.6°F | Ht 68.0 in | Wt 226.0 lb

## 2014-01-24 DIAGNOSIS — R635 Abnormal weight gain: Secondary | ICD-10-CM

## 2014-01-24 DIAGNOSIS — L680 Hirsutism: Secondary | ICD-10-CM

## 2014-01-24 DIAGNOSIS — E669 Obesity, unspecified: Secondary | ICD-10-CM

## 2014-01-24 MED ORDER — EFLORNITHINE HCL 13.9 % EX CREA: 1.0000 "application " | TOPICAL_CREAM | Freq: Two times a day (BID) | CUTANEOUS | Status: DC

## 2014-01-24 MED ORDER — SPIRONOLACTONE 100 MG PO TABS: 100.0000 mg | ORAL_TABLET | Freq: Every day | ORAL | Status: DC

## 2014-01-24 NOTE — Progress Notes (Signed)
   Subjective:    Patient ID: Michaela Dodson, female    DOB: [birth year], 43 y.o.   MRN: [id number]  HPI Gets dark hair on chin and neck. Says has to shave it.  Has tried laser and electrolysis.  Has tried waxing and tweezing.   Says getting worse the older she gets.  Wants to Discuss tx options.  She has hard time losing weight.  She used to have irregular period until she had endometrial ablation.   Hx of blood clots.  She has gained 26 lbs since I last saw her.  She tried the Qsymia but had to come off before her carpal tunnel surgery. Her surgeon wanted her off at that time. Though she wasn't very impressed with the medication. She didn't feel like it is a very good job at suppressing her appetite. She's not really sure if she lost weight on it.   Obesity-She did quit smoking.  She's not currently exercising but plans on going to join a gym today as she has a day off work. She has gained over 20 pounds since I last saw her.   Review of Systems     Objective:   Physical Exam  Constitutional: She appears well-developed and well-nourished.  HENT:  Head: Normocephalic and atraumatic.  Skin: Skin is warm and dry.  Hair growth on her chin and neck.    Psychiatric: She has a normal mood and affect. Her behavior is normal.          Assessment & Plan:  Hirsuitism - Discussed options. Will start spironolactone and the Vaniqa.  Discussed potential side effects of the medication he will need to monitor her carefully. Followup in 6-8 weeks to make sure that she's tolerating it well and to see if she's noticing any improvement in her symptoms.  Obesity/abnormal weight gain - she says she really wasn't impressed by the Qsymia. She's interested in maybe considering something else. She plans on going to the gym and starting membership today. She has not been working out and has gained over 20 pounds since I last saw her 3 months ago. We also discussed metformin as an option. Based on her symptoms I  suspect that she probably has PCO S. And would be a good candidate for metformin. He can help regulate glucose as well as help with weight loss. She said she will think about it between now and her followup appointment.  Time spent 25 minutes, greater than 50% of time spent discussing excess hair growth, obesity and desire for weight loss.

## 2014-02-21 ENCOUNTER — Ambulatory Visit (INDEPENDENT_AMBULATORY_CARE_PROVIDER_SITE_OTHER): Payer: BC Managed Care – PPO | Admitting: Family Medicine

## 2014-02-21 ENCOUNTER — Encounter: Payer: Self-pay | Admitting: Family Medicine

## 2014-02-21 VITALS — BP 118/80 | HR 79 | Temp 98.5°F | Wt 235.0 lb

## 2014-02-21 DIAGNOSIS — J029 Acute pharyngitis, unspecified: Secondary | ICD-10-CM

## 2014-02-21 DIAGNOSIS — H10029 Other mucopurulent conjunctivitis, unspecified eye: Secondary | ICD-10-CM

## 2014-02-21 DIAGNOSIS — E669 Obesity, unspecified: Secondary | ICD-10-CM

## 2014-02-21 DIAGNOSIS — J329 Chronic sinusitis, unspecified: Secondary | ICD-10-CM

## 2014-02-21 DIAGNOSIS — A499 Bacterial infection, unspecified: Secondary | ICD-10-CM

## 2014-02-21 DIAGNOSIS — B9689 Other specified bacterial agents as the cause of diseases classified elsewhere: Secondary | ICD-10-CM

## 2014-02-21 MED ORDER — POLYMYXIN B-TRIMETHOPRIM 10000-0.1 UNIT/ML-% OP SOLN: 2.0000 [drp] | OPHTHALMIC | Status: DC

## 2014-02-21 MED ORDER — AMOXICILLIN-POT CLAVULANATE 500-125 MG PO TABS: ORAL_TABLET | ORAL | Status: AC

## 2014-02-21 NOTE — Progress Notes (Signed)
CC: Michaela Dodson is a 43 y.o. female is here for Sinusitis, Sore Throat and Conjunctivitis   Subjective: HPI:  Comparison nasal congestion and facial pressure localizes beneath both eyes and above the for head that has been present for the past week worsening a daily basis. Interventions have included Advil cold and sinus without much benefit. Symptoms are present all hours of the day worse first thing in the morning. Accompanied by sore throat that began 2 days ago described as scratchy and is mild in severity worse with swallowing.  Awoke this morning with reddening of the left eye with mild to moderate discharge upon awakening mild tearing ever since.  Denies foreign body sensation, ocular pain, photophobia, vision loss. Denies fevers, chills, choking, shortness of breath, cough, swollen lymph nodes, rash, myalgias, confusion or motor or sensory disturbances  She would like me to prescribe her weight loss medication that she discussed with her PCP at her most recent visit. States that the spironolactone has not helped with losing any water weight.   Review Of Systems Outlined In HPI  Past Medical History  Diagnosis Date  . Allergy   . Depression   . GERD (gastroesophageal reflux disease)   . Obesity   . H/O thromboembolism     superficial-left medial thigh lipoma, vericose veins    Past Surgical History  Procedure Laterality Date  . Tubal ligation    . Breast biopsy    . Uterine ablation    . Carpal tunnel release Right 09/2012    Dr. Apolonio Dodson  . Carpal tunnel release Left 11/2013    Dr. Apolonio Dodson   Family History  Problem Relation Age of Onset  . Breast cancer Mother   . Bladder Cancer Mother     History   Social History  . Marital Status: Married    Spouse Name: N/A    Number of Children: N/A  . Years of Education: N/A   Occupational History  . Not on file.   Social History Main Topics  . Smoking status: Current Some Day Smoker    Types: Cigarettes  . Smokeless  tobacco: Not on file     Comment: e-cigs  . Alcohol Use: Yes  . Drug Use: No  . Sexual Activity: Not on file   Other Topics Concern  . Not on file   Social History Narrative  . No narrative on file     Objective: BP 118/80  Pulse 79  Temp(Src) 98.5 F (36.9 C)  Wt 235 lb (106.595 kg)  SpO2 98%  General: Alert and Oriented, No Acute Distress HEENT: Pupils equal, round, reactive to light. Right Conjunctivae clear, left conjunctiva has mild erythema in the periphery sparing the limbus. Anterior chamber of both eyes appears open without debris.  External ears unremarkable, canals clear with intact TMs with appropriate landmarks.  Middle ear appears open without effusion. Pink inferior turbinates.  Moist mucous membranes, pharynx without inflammation nor lesions however moderate post nasal drip.  Neck supple without palpable lymphadenopathy nor abnormal masses. Lungs: Clear to auscultation bilaterally, no wheezing/ronchi/rales.  Comfortable work of breathing. Good air movement. Extremities: No peripheral edema.  Strong peripheral pulses.  Mental Status: No depression, anxiety, nor agitation. Skin: Warm and dry.  Assessment & Plan: Michaela Dodson was seen today for sinusitis, sore throat and conjunctivitis.  Diagnoses and associated orders for this visit:  Sore throat - POCT rapid strep A  Bacterial sinusitis - amoxicillin-clavulanate (AUGMENTIN) 500-125 MG per tablet; Take one by mouth every 8  hours for ten total days.  Pink eye - trimethoprim-polymyxin b (POLYTRIM) ophthalmic solution; Place 2 drops into the left eye every 4 (four) hours.  Obesity    Sore throat: Rapid strep negative I believe is due to postnasal drip due to below Bacterial sinusitis will start Augmentin consider nasal saline washes to help with sore throat and congestion feeling Pink eye: Suspect viral but will cover for any bacterial involvement with Polytrim Obesity: Politely stated that she needs to  followup with her PCP regarding this matter today was scheduled as an acute visit, discussed that spironolactone is not a potent diuretic and it is not alarming that she has not noticed significant diuresis  25 minutes spent face-to-face during visit today of which at least 50% was counseling or coordinating care regarding: 1. Sore throat   2. Bacterial sinusitis   3. Pink eye   4. Obesity      Return in about 4 weeks (around 03/21/2014) for Obesity FU.

## 2014-03-06 ENCOUNTER — Ambulatory Visit (INDEPENDENT_AMBULATORY_CARE_PROVIDER_SITE_OTHER): Payer: BC Managed Care – PPO | Admitting: Physician Assistant

## 2014-03-06 ENCOUNTER — Encounter: Payer: Self-pay | Admitting: Physician Assistant

## 2014-03-06 VITALS — BP 107/70 | HR 95 | Temp 97.9°F | Wt 233.0 lb

## 2014-03-06 DIAGNOSIS — R109 Unspecified abdominal pain: Secondary | ICD-10-CM

## 2014-03-06 DIAGNOSIS — R319 Hematuria, unspecified: Secondary | ICD-10-CM

## 2014-03-06 DIAGNOSIS — R112 Nausea with vomiting, unspecified: Secondary | ICD-10-CM

## 2014-03-06 LAB — CBC WITH DIFFERENTIAL/PLATELET
BASOS PCT: 0 % (ref 0–1)
Basophils Absolute: 0 10*3/uL (ref 0.0–0.1)
EOS ABS: 0 10*3/uL (ref 0.0–0.7)
Eosinophils Relative: 0 % (ref 0–5)
HCT: 40.6 % (ref 36.0–46.0)
Hemoglobin: 14.3 g/dL (ref 12.0–15.0)
LYMPHS ABS: 0.7 10*3/uL (ref 0.7–4.0)
Lymphocytes Relative: 6 % — ABNORMAL LOW (ref 12–46)
MCH: 32.9 pg (ref 26.0–34.0)
MCHC: 35.2 g/dL (ref 30.0–36.0)
MCV: 93.3 fL (ref 78.0–100.0)
Monocytes Absolute: 0.4 10*3/uL (ref 0.1–1.0)
Monocytes Relative: 4 % (ref 3–12)
NEUTROS PCT: 90 % — AB (ref 43–77)
Neutro Abs: 9.9 10*3/uL — ABNORMAL HIGH (ref 1.7–7.7)
PLATELETS: 224 10*3/uL (ref 150–400)
RBC: 4.35 MIL/uL (ref 3.87–5.11)
RDW: 12.8 % (ref 11.5–15.5)
WBC: 11 10*3/uL — ABNORMAL HIGH (ref 4.0–10.5)

## 2014-03-06 LAB — COMPLETE METABOLIC PANEL WITH GFR
ALK PHOS: 47 U/L (ref 39–117)
ALT: 10 U/L (ref 0–35)
AST: 12 U/L (ref 0–37)
Albumin: 4.6 g/dL (ref 3.5–5.2)
BILIRUBIN TOTAL: 2.6 mg/dL — AB (ref 0.2–1.2)
BUN: 15 mg/dL (ref 6–23)
CO2: 24 mEq/L (ref 19–32)
Calcium: 9.4 mg/dL (ref 8.4–10.5)
Chloride: 104 mEq/L (ref 96–112)
Creat: 0.57 mg/dL (ref 0.50–1.10)
GFR, Est African American: >89 mL/min
GFR, Est Non African American: >89 mL/min
Glucose, Bld: 116 mg/dL — ABNORMAL HIGH (ref 70–99)
Potassium: 4.7 mEq/L (ref 3.5–5.3)
SODIUM: 139 meq/L (ref 135–145)
TOTAL PROTEIN: 6.9 g/dL (ref 6.0–8.3)

## 2014-03-06 LAB — POCT URINALYSIS DIPSTICK
BILIRUBIN UA: NEGATIVE
GLUCOSE UA: NEGATIVE
Ketones, UA: NEGATIVE
Leukocytes, UA: NEGATIVE
Nitrite, UA: NEGATIVE
Protein, UA: NEGATIVE
SPEC GRAV UA: 1.02
Urobilinogen, UA: 0.2
pH, UA: 7

## 2014-03-06 LAB — LIPASE: Lipase: 35 U/L (ref 0–75)

## 2014-03-06 MED ORDER — ONDANSETRON HCL 4 MG PO TABS: 4.0000 mg | ORAL_TABLET | Freq: Three times a day (TID) | ORAL | Status: DC | PRN

## 2014-03-06 MED ORDER — DICYCLOMINE HCL 10 MG PO CAPS: 10.0000 mg | ORAL_CAPSULE | Freq: Three times a day (TID) | ORAL | Status: DC

## 2014-03-06 NOTE — Patient Instructions (Signed)
Viral Gastroenteritis Viral gastroenteritis is also known as stomach flu. This condition affects the stomach and intestinal tract. It can cause sudden diarrhea and vomiting. The illness typically lasts 3 to 8 days. Most people develop an immune response that eventually gets rid of the virus. While this natural response develops, the virus can make you quite ill. CAUSES  Many different viruses can cause gastroenteritis, such as rotavirus or noroviruses. You can catch one of these viruses by consuming contaminated food or water. You may also catch a virus by sharing utensils or other personal items with an infected person or by touching a contaminated surface. SYMPTOMS  The most common symptoms are diarrhea and vomiting. These problems can cause a severe loss of body fluids (dehydration) and a body salt (electrolyte) imbalance. Other symptoms may include:  Fever.  Headache.  Fatigue.  Abdominal pain. DIAGNOSIS  Your caregiver can usually diagnose viral gastroenteritis based on your symptoms and a physical exam. A stool sample may also be taken to test for the presence of viruses or other infections. TREATMENT  This illness typically goes away on its own. Treatments are aimed at rehydration. The most serious cases of viral gastroenteritis involve vomiting so severely that you are not able to keep fluids down. In these cases, fluids must be given through an intravenous line (IV). HOME CARE INSTRUCTIONS   Drink enough fluids to keep your urine clear or pale yellow. Drink small amounts of fluids frequently and increase the amounts as tolerated.  Ask your caregiver for specific rehydration instructions.  Avoid:  Foods high in sugar.  Alcohol.  Carbonated drinks.  Tobacco.  Juice.  Caffeine drinks.  Extremely hot or cold fluids.  Fatty, greasy foods.  Too much intake of anything at one time.  Dairy products until 24 to 48 hours after diarrhea stops.  You may consume probiotics.  Probiotics are active cultures of beneficial bacteria. They may lessen the amount and number of diarrheal stools in adults. Probiotics can be found in yogurt with active cultures and in supplements.  Wash your hands well to avoid spreading the virus.  Only take over-the-counter or prescription medicines for pain, discomfort, or fever as directed by your caregiver. Do not give aspirin to children. Antidiarrheal medicines are not recommended.  Ask your caregiver if you should continue to take your regular prescribed and over-the-counter medicines.  Keep all follow-up appointments as directed by your caregiver. SEEK IMMEDIATE MEDICAL CARE IF:   You are unable to keep fluids down.  You do not urinate at least once every 6 to 8 hours.  You develop shortness of breath.  You notice blood in your stool or vomit. This may look like coffee grounds.  You have abdominal pain that increases or is concentrated in one small area (localized).  You have persistent vomiting or diarrhea.  You have a fever.  The patient is a child younger than 3 months, and he or she has a fever.  The patient is a child older than 3 months, and he or she has a fever and persistent symptoms.  The patient is a child older than 3 months, and he or she has a fever and symptoms suddenly get worse.  The patient is a baby, and he or she has no tears when crying. MAKE SURE YOU:   Understand these instructions.  Will watch your condition.  Will get help right away if you are not doing well or get worse. Document Released: 12/14/2005 Document Revised: 03/07/2012 Document Reviewed: 09/30/2011   [employer] Patient Information 2014 [employer]  

## 2014-03-06 NOTE — Progress Notes (Signed)
   Subjective:    Patient ID: Michaela Dodson, female    DOB: 07-14-1971, 43 y.o.   MRN: [id number]  HPI patient is a 43 year old female who presents to the clinic with acute onset of abdominal pain and vomiting. She woke up this morning at 3 AM and she was very nauseated. She started vomiting and was having intense abdominal pain. Abdominal pain is constant but she has episodes of what feel like sharp pains going through her belly button. No one else is sick. She denies any visible blood in urine or bowel movements. She denies any diarrhea. She does not have any pain with urination. She does have a headache. She has not tried anything to make better and nothing makes worse. She just feels fatigued and drained. She denies any fever. She has had chills. She ate the same thing as her husband last night which was left overs from a restaurant.     Review of Systems     Objective:   Physical Exam  Constitutional: She is oriented to person, place, and time.  Found laying on the examining table when walked into room.  HENT:  Head: Normocephalic and atraumatic.  Cardiovascular: Normal rate, regular rhythm and normal heart sounds.   Pulmonary/Chest: Effort normal and breath sounds normal. She has no wheezes.  Abdominal: Soft.  Hyperactive bowel sounds.  Diffuse abdominal tenderness with palpation over her entire abdomen. Worse pain in the left lower quadrant period. Pt almost cried when touching abdomen. Pt's appendix has been removed. Negative murphys sign. No rebound worse with deep palpation.   Neurological: She is alert and oriented to person, place, and time.  Skin: She is not diaphoretic.  Psychiatric: She has a normal mood and affect. Her behavior is normal.          Assessment & Plan:   Acute abdominal pain-  UA was positive for blood but negative for nitrates or leukocytes. Patient has history of blood in urine. Will culture. this certainly could be a viral gastroenteritis however  patient does have significant tenderness over the entire abdomen and slightly worse tenderness over the lower left quadrant. I will get a CBC to look at the white blood count. If elevated or proceed with a CT of the abdomen. I did give patient medication for nausea to use as needed. I also gave been told to use for potential abdominal spasms if she tries to eat or up to 4 times a day. Labs were put in stat if elevated will get a stat CT scan. Patient alerted if nausea and vomiting gets worse to go to emergency room. Continue to stay hydrated and remember brat diet.

## 2014-03-07 ENCOUNTER — Telehealth: Payer: Self-pay | Admitting: *Deleted

## 2014-03-07 ENCOUNTER — Other Ambulatory Visit: Payer: Self-pay | Admitting: Physician Assistant

## 2014-03-07 ENCOUNTER — Ambulatory Visit (INDEPENDENT_AMBULATORY_CARE_PROVIDER_SITE_OTHER): Payer: BC Managed Care – PPO

## 2014-03-07 DIAGNOSIS — K769 Liver disease, unspecified: Secondary | ICD-10-CM

## 2014-03-07 DIAGNOSIS — K7689 Other specified diseases of liver: Secondary | ICD-10-CM

## 2014-03-07 MED ORDER — IOHEXOL 300 MG/ML  SOLN
100.0000 mL | Freq: Once | INTRAMUSCULAR | Status: AC | PRN
  Administered 2014-03-07: 100 mL via INTRAVENOUS

## 2014-03-07 MED ORDER — HYDROCODONE-ACETAMINOPHEN 7.5-325 MG PO TABS: 1.0000 | ORAL_TABLET | Freq: Three times a day (TID) | ORAL | Status: DC | PRN

## 2014-03-07 NOTE — Telephone Encounter (Signed)
PA obtained for MRI ABD with and w/o contrast. Auth # [id number]. Good till 04/05/14.  Oscar La, LPN

## 2014-03-07 NOTE — Telephone Encounter (Signed)
CT ABD/Pelvis w/contrast approved. Auth # [id number]. Exp. 04/05/14.  Oscar La, LPN

## 2014-03-09 LAB — URINE CULTURE: Colony Count: 40000

## 2014-03-15 ENCOUNTER — Telehealth: Payer: Self-pay | Admitting: *Deleted

## 2014-03-16 ENCOUNTER — Other Ambulatory Visit: Payer: Self-pay | Admitting: Family Medicine

## 2014-03-16 DIAGNOSIS — R635 Abnormal weight gain: Secondary | ICD-10-CM

## 2014-03-16 DIAGNOSIS — L678 Other hair color and hair shaft abnormalities: Secondary | ICD-10-CM

## 2014-03-16 NOTE — Telephone Encounter (Signed)
Oops.. Yes did not know what to associate to send referral so I pended the referral.Amiley Shishido, Lahoma Crocker

## 2014-03-16 NOTE — Telephone Encounter (Signed)
Not is empty, wasn't sure if needed something

## 2014-03-28 ENCOUNTER — Encounter: Payer: Self-pay | Admitting: Physician Assistant

## 2014-04-15 ENCOUNTER — Other Ambulatory Visit: Payer: Self-pay | Admitting: Family Medicine

## 2014-08-21 ENCOUNTER — Other Ambulatory Visit: Payer: Self-pay | Admitting: Family Medicine

## 2014-12-02 ENCOUNTER — Other Ambulatory Visit: Payer: Self-pay | Admitting: Family Medicine

## 2014-12-05 ENCOUNTER — Other Ambulatory Visit: Payer: Self-pay | Admitting: Family Medicine

## 2014-12-13 ENCOUNTER — Other Ambulatory Visit: Payer: Self-pay | Admitting: Family Medicine

## 2015-01-10 ENCOUNTER — Encounter: Payer: Self-pay | Admitting: Family Medicine

## 2015-01-10 ENCOUNTER — Ambulatory Visit (INDEPENDENT_AMBULATORY_CARE_PROVIDER_SITE_OTHER): Payer: BLUE CROSS/BLUE SHIELD | Admitting: Family Medicine

## 2015-01-10 ENCOUNTER — Other Ambulatory Visit: Payer: Self-pay | Admitting: Family Medicine

## 2015-01-10 VITALS — BP 134/87 | HR 92 | Temp 97.5°F | Wt 233.0 lb

## 2015-01-10 DIAGNOSIS — R109 Unspecified abdominal pain: Secondary | ICD-10-CM

## 2015-01-10 DIAGNOSIS — L680 Hirsutism: Secondary | ICD-10-CM | POA: Diagnosis not present

## 2015-01-10 DIAGNOSIS — M5489 Other dorsalgia: Secondary | ICD-10-CM | POA: Diagnosis not present

## 2015-01-10 DIAGNOSIS — R319 Hematuria, unspecified: Secondary | ICD-10-CM | POA: Diagnosis not present

## 2015-01-10 LAB — POCT URINALYSIS DIPSTICK
Bilirubin, UA: NEGATIVE
GLUCOSE UA: NEGATIVE
KETONES UA: NEGATIVE
LEUKOCYTES UA: NEGATIVE
Nitrite, UA: NEGATIVE
PH UA: 5.5
Protein, UA: NEGATIVE
UROBILINOGEN UA: 0.2

## 2015-01-10 LAB — BASIC METABOLIC PANEL WITH GFR
BUN: 8 mg/dL (ref 6–23)
CO2: 25 mEq/L (ref 19–32)
Calcium: 9.5 mg/dL (ref 8.4–10.5)
Chloride: 101 mEq/L (ref 96–112)
Creat: 0.8 mg/dL (ref 0.50–1.10)
GFR, Est Non African American: >89 mL/min
Glucose, Bld: 94 mg/dL (ref 70–99)
Potassium: 4.2 mEq/L (ref 3.5–5.3)
SODIUM: 136 meq/L (ref 135–145)

## 2015-01-10 MED ORDER — PREDNISONE 20 MG PO TABS: ORAL_TABLET | ORAL | Status: AC

## 2015-01-10 NOTE — Addendum Note (Signed)
Addended by: Isaias Cowman C on: 01/10/2015 11:43 AM   Modules accepted: Orders

## 2015-01-10 NOTE — Progress Notes (Signed)
CC: Michaela Dodson is a 44 y.o. female is here for f/u UTI   Subjective: HPI:  Complains of severe midline back pain that occurred out of nowhere 10 days ago, she was seen at a local emergency room and had an x-ray showing degenerative disc disease and facet arthropathy. She also had evidence of a urinary tract infection and was given Bactrim for 10 days. She says 2 days after taking Bactrim her pain began to improve but then began to migrate to the right flank. She still has an element of midline back pain in the upper lumbar region and moderate right flank pain both of which are worse with bending forward or lying on her right side. Symptoms are improved with sitting straight or lying on her left side. Pain is nonradiating. Even while at the emergency room she denies any urinary symptoms other than malodorous urine. She denies dysuria, blood in urine, hesitancy urgency nor frequency. Denies fevers, chills, abdominal pain.  She is requesting refills on spironolactone. She's been taking this for hirsutism and states that since starting it hair growth has been reduced to a satisfactory degree. She denies any known side effects or intolerance to the medication.  Review Of Systems Outlined In HPI  Past Medical History  Diagnosis Date  . Allergy   . Depression   . GERD (gastroesophageal reflux disease)   . Obesity   . H/O thromboembolism     superficial-left medial thigh lipoma, vericose veins    Past Surgical History  Procedure Laterality Date  . Tubal ligation    . Breast biopsy    . Uterine ablation    . Carpal tunnel release Right 09/2012    Dr. Apolonio Schneiders  . Carpal tunnel release Left 11/2013    Dr. Apolonio Schneiders   Family History  Problem Relation Age of Onset  . Breast cancer Mother   . Bladder Cancer Mother     History   Social History  . Marital Status: Married    Spouse Name: N/A    Number of Children: N/A  . Years of Education: N/A   Occupational History  . Not on file.    Social History Main Topics  . Smoking status: Current Some Day Smoker    Types: Cigarettes  . Smokeless tobacco: Not on file     Comment: e-cigs  . Alcohol Use: Yes  . Drug Use: No  . Sexual Activity: Not on file   Other Topics Concern  . Not on file   Social History Narrative     Objective: BP 134/87 mmHg  Pulse 92  Temp(Src) 97.5 F (36.4 C) (Oral)  Wt 233 lb (105.688 kg)  General: Alert and Oriented, No Acute Distress HEENT: Pupils equal, round, reactive to light. Conjunctivae clear.  Moist mucous membranes pharynx unremarkable Lungs: Clear comfortable work of breathing Cardiac: Regular rate and rhythm.  Back: Midline spinous process tenderness of the upper lumbar spine near L1 and L2. No paraspinal musculature tenderness. Left flank pain is slightly reproduced with palpation of the right latissimus dorsi, no CVA tenderness to percussion. Full Range of motion and strength of the lumbar and thoracic spine. Extremities: No peripheral edema.  Strong peripheral pulses.  Mental Status: No depression, anxiety, nor agitation. Skin: Warm and dry.  Assessment & Plan: Michaela Dodson was seen today for f/u uti.  Diagnoses and associated orders for this visit:  Other back pain - Urine Culture - predniSONE (DELTASONE) 20 MG tablet; Three tabs daily days 1-3, two tabs daily  days 4-6, one tab daily days 7-9, half tab daily days 92-44. - BASIC METABOLIC PANEL WITH GFR  Hematuria - BASIC METABOLIC PANEL WITH GFR  Flank pain - BASIC METABOLIC PANEL WITH GFR  Hirsutism    Back pain: Urinalysis today shows blood however she has a history of microscopic hematuria and she has no other symptoms to suggest a UTI causing her back pain. We will obtain a culture to determine if she completely eradicated her UTI from 10 days ago. Suspect that midline pain is coming from degenerative disc disease and/or facet arthritis therefore prednisone taper has been provided. Hirsutism: Control improved,  will provide refills if renal function and electrolytes are normal on BMP today.  25 minutes spent face-to-face during visit today of which at least 50% was counseling or coordinating care regarding: 1. Other back pain   2. Hematuria   3. Flank pain   4. Hirsutism      Return if symptoms worsen or fail to improve.

## 2015-01-11 ENCOUNTER — Telehealth: Payer: Self-pay | Admitting: Family Medicine

## 2015-01-11 MED ORDER — SPIRONOLACTONE 100 MG PO TABS: ORAL_TABLET | ORAL | Status: DC

## 2015-01-11 NOTE — Telephone Encounter (Signed)
Michaela Dodson, Will you please let patient know that her kidney function was normal therefore I've sent refills of spironolactone to her [facility].

## 2015-01-11 NOTE — Telephone Encounter (Signed)
Pt.notified

## 2015-01-12 LAB — URINE CULTURE
Colony Count: NO GROWTH
Organism ID, Bacteria: NO GROWTH

## 2015-03-06 ENCOUNTER — Encounter: Payer: Self-pay | Admitting: Family Medicine

## 2015-03-06 ENCOUNTER — Other Ambulatory Visit: Payer: Self-pay | Admitting: *Deleted

## 2015-03-06 ENCOUNTER — Ambulatory Visit (INDEPENDENT_AMBULATORY_CARE_PROVIDER_SITE_OTHER): Payer: BLUE CROSS/BLUE SHIELD | Admitting: Family Medicine

## 2015-03-06 ENCOUNTER — Other Ambulatory Visit (HOSPITAL_COMMUNITY)
Admission: RE | Admit: 2015-03-06 | Discharge: 2015-03-06 | Disposition: A | Discharge status: Home / Self Care | Payer: BLUE CROSS/BLUE SHIELD | Source: Ambulatory Visit | Attending: Family Medicine | Admitting: Family Medicine

## 2015-03-06 VITALS — BP 121/79 | HR 94 | Ht 68.0 in | Wt 242.0 lb

## 2015-03-06 DIAGNOSIS — Z01419 Encounter for gynecological examination (general) (routine) without abnormal findings: Secondary | ICD-10-CM | POA: Insufficient documentation

## 2015-03-06 DIAGNOSIS — M47816 Spondylosis without myelopathy or radiculopathy, lumbar region: Secondary | ICD-10-CM | POA: Insufficient documentation

## 2015-03-06 DIAGNOSIS — Z124 Encounter for screening for malignant neoplasm of cervix: Secondary | ICD-10-CM

## 2015-03-06 DIAGNOSIS — N3941 Urge incontinence: Secondary | ICD-10-CM

## 2015-03-06 DIAGNOSIS — T50905A Adverse effect of unspecified drugs, medicaments and biological substances, initial encounter: Secondary | ICD-10-CM

## 2015-03-06 DIAGNOSIS — M5137 Other intervertebral disc degeneration, lumbosacral region: Secondary | ICD-10-CM | POA: Insufficient documentation

## 2015-03-06 DIAGNOSIS — F411 Generalized anxiety disorder: Secondary | ICD-10-CM | POA: Diagnosis not present

## 2015-03-06 DIAGNOSIS — R3915 Urgency of urination: Secondary | ICD-10-CM

## 2015-03-06 DIAGNOSIS — R6882 Decreased libido: Secondary | ICD-10-CM

## 2015-03-06 DIAGNOSIS — Z6836 Body mass index (BMI) 36.0-36.9, adult: Secondary | ICD-10-CM | POA: Diagnosis not present

## 2015-03-06 DIAGNOSIS — R319 Hematuria, unspecified: Secondary | ICD-10-CM

## 2015-03-06 DIAGNOSIS — E669 Obesity, unspecified: Secondary | ICD-10-CM | POA: Diagnosis not present

## 2015-03-06 DIAGNOSIS — M545 Low back pain, unspecified: Secondary | ICD-10-CM

## 2015-03-06 DIAGNOSIS — Z114 Encounter for screening for human immunodeficiency virus [HIV]: Secondary | ICD-10-CM

## 2015-03-06 LAB — POCT URINALYSIS DIPSTICK
Bilirubin, UA: NEGATIVE
Glucose, UA: NEGATIVE
Ketones, UA: NEGATIVE
Leukocytes, UA: NEGATIVE
NITRITE UA: NEGATIVE
PROTEIN UA: NEGATIVE
Spec Grav, UA: 1.015
UROBILINOGEN UA: 0.2
pH, UA: 7.5

## 2015-03-06 MED ORDER — CIPROFLOXACIN HCL 500 MG PO TABS: 500.0000 mg | ORAL_TABLET | Freq: Two times a day (BID) | ORAL | Status: AC

## 2015-03-06 MED ORDER — BUPROPION HCL ER (XL) 150 MG PO TB24
150.0000 mg | ORAL_TABLET | ORAL | Status: DC
Start: 2015-03-06 — End: 2015-06-02

## 2015-03-06 MED ORDER — OMEPRAZOLE 40 MG PO CPDR: 40.0000 mg | DELAYED_RELEASE_CAPSULE | Freq: Every day | ORAL | Status: DC

## 2015-03-06 NOTE — Patient Instructions (Signed)
Keep up a regular exercise program and make sure you are eating a healthy diet Try to eat 4 servings of dairy a day, or if you are lactose intolerant take a calcium with vitamin D daily.  Your vaccines are up to date.   
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
## 2015-03-07 LAB — COMPLETE METABOLIC PANEL WITH GFR
ALK PHOS: 43 U/L (ref 39–117)
ALT: 40 U/L — ABNORMAL HIGH (ref 0–35)
AST: 26 U/L (ref 0–37)
Albumin: 4.9 g/dL (ref 3.5–5.2)
BUN: 15 mg/dL (ref 6–23)
CHLORIDE: 99 meq/L (ref 96–112)
CO2: 28 mEq/L (ref 19–32)
Calcium: 9.9 mg/dL (ref 8.4–10.5)
Creat: 0.67 mg/dL (ref 0.50–1.10)
GFR, Est African American: >89 mL/min
GLUCOSE: 84 mg/dL (ref 70–99)
POTASSIUM: 4.5 meq/L (ref 3.5–5.3)
Sodium: 138 mEq/L (ref 135–145)
Total Bilirubin: 2.1 mg/dL — ABNORMAL HIGH (ref 0.2–1.2)
Total Protein: 7.4 g/dL (ref 6.0–8.3)

## 2015-03-07 LAB — CBC
HCT: 44.4 % (ref 36.0–46.0)
Hemoglobin: 15.3 g/dL — ABNORMAL HIGH (ref 12.0–15.0)
MCH: 32.8 pg (ref 26.0–34.0)
MCHC: 34.5 g/dL (ref 30.0–36.0)
MCV: 95.1 fL (ref 78.0–100.0)
MPV: 10.9 fL (ref 8.6–12.4)
Platelets: 272 10*3/uL (ref 150–400)
RBC: 4.67 MIL/uL (ref 3.87–5.11)
RDW: 13.4 % (ref 11.5–15.5)
WBC: 9.2 10*3/uL (ref 4.0–10.5)

## 2015-03-07 LAB — C-REACTIVE PROTEIN: CRP: <0.5 mg/dL (ref <0.60)

## 2015-03-07 LAB — LIPID PANEL
CHOL/HDL RATIO: 3.7 ratio
Cholesterol: 210 mg/dL — ABNORMAL HIGH (ref 0–200)
HDL: 57 mg/dL (ref >=46)
LDL Cholesterol: 107 mg/dL — ABNORMAL HIGH (ref 0–99)
Triglycerides: 229 mg/dL — ABNORMAL HIGH (ref <150)
VLDL: 46 mg/dL — AB (ref 0–40)

## 2015-03-07 LAB — SEDIMENTATION RATE: Sed Rate: 4 mm/hr (ref 0–20)

## 2015-03-07 LAB — HIV ANTIBODY (ROUTINE TESTING W REFLEX): HIV 1&2 Ab, 4th Generation: NONREACTIVE

## 2015-03-08 LAB — CYTOLOGY - PAP

## 2015-03-08 NOTE — Progress Notes (Signed)
Quick Note:  Call patient: Your Pap smear is normal. Repeat in 3 years. ______ 

## 2015-03-10 LAB — URINE CULTURE: Colony Count: >=100000

## 2015-03-14 ENCOUNTER — Ambulatory Visit (INDEPENDENT_AMBULATORY_CARE_PROVIDER_SITE_OTHER): Payer: BLUE CROSS/BLUE SHIELD | Admitting: Sports Medicine

## 2015-03-14 ENCOUNTER — Encounter: Payer: Self-pay | Admitting: Sports Medicine

## 2015-03-14 VITALS — BP 124/75 | HR 85 | Ht 68.0 in | Wt 243.0 lb

## 2015-03-14 DIAGNOSIS — M5136 Other intervertebral disc degeneration, lumbar region: Secondary | ICD-10-CM

## 2015-03-14 DIAGNOSIS — M51369 Other intervertebral disc degeneration, lumbar region without mention of lumbar back pain or lower extremity pain: Secondary | ICD-10-CM

## 2015-03-14 NOTE — Progress Notes (Signed)
   Subjective:    I'm seeing this patient as a consultation for:  Dr. [name]  CC: low back pain  HPI: Patient has suffered with low back pain for may years. Her back pain is constant, 5/10 in severity, achy in quality, and does not radiate. The pain is worsened with flexion, and long periods of sitting. She reports intermittent numbness and tingling in both feet and ankles provoked by prolonged sitting, however she cannot recall specific distribution of these symptoms. Over the past 2 years she has tried formal physical therapy, NSAIDs, Neurontin, Flexeril, narcotic medication, prednisone, and chiropractic manipulation. Prednisone and massage have helped relieve some of her pain, but it always returns. She denies any LE weakness, saddle anesthesia, or fecal incontinence.   Past medical history, Surgical history, Family history not pertinant except as noted below, Social history, Allergies, and medications have been entered into the medical record, reviewed, and no changes needed.   Review of Systems: No headache, visual changes, nausea, vomiting, diarrhea, constipation, dizziness, abdominal pain, skin rash, fevers, chills, night sweats, weight loss, swollen lymph nodes, body aches, joint swelling, muscle aches, chest pain, shortness of breath, mood changes, visual or auditory hallucinations.   Objective:   General: Well Developed, well nourished, and in no acute distress.  Neuro/Psych: Alert and oriented x3, extra-ocular muscles intact, able to move all 4 extremities, sensation grossly intact. Skin: Warm and dry, no rashes noted.  Respiratory: Not using accessory muscles, speaking in full sentences, trachea midline.  Cardiovascular: Pulses palpable, no extremity edema. Abdomen: Does not appear distended. MSK: Back Exam:  Inspection: Unremarkable, low back tattoos present Motion: Flexion 45 deg, Extension 45 deg, Side Bending to 45 deg bilaterally,  Rotation to 45 deg bilaterally    SLR laying: Negative  XSLR laying: Negative  Palpable tenderness: mid back, approximately at the level of the illiac crests. Sensory change: Gross sensation intact to all lumbar and sacral dermatomes.  Reflexes: 2+ at both patellar tendons, 2+ at achilles tendons, Babinski's mute.  Strength at foot  Plantar-flexion: 5/5 Dorsi-flexion: 5/5 Eversion: 5/5 Inversion: 5/5  Leg strength  Quad: 5/5 Hamstring: 5/5 Hip flexor: 5/5 Hip abductors: 5/5  Gait unremarkable. Pes Cavus noted, patient's footwear with minimal support.   Impression and Recommendations:   This case required medical decision making of moderate complexity.  # Back Pain - Patient with diskogenic pain symptoms with radiculopathy, however symptoms not localizable to specific nerve root level at this time. - Patient with lumbar degenerative disk disease and disk protrusions evident at multiple levels on CT abdomen pelvis 03/07/14 - Patient encouraged to keep a symptom diary and note the specific distribution of numbness in her feet - Patient referred for MRI to plan for epidural injection - Patient with pes cavus and minimally supportive footwear, plan to fabricate custom orthotics   Follow up in 1 week to review MRI

## 2015-03-14 NOTE — Assessment & Plan Note (Signed)
With predominantly axial and discogenic-type pain. CT did show multilevel disc protrusions and lower lumbar in plate osteophytes, facets looked okay. She has already had an x-ray at [facility], we are going to obtain an MRI for interventional planning. She has pes cavus and I would like her to come back for custom orthotics. She will also follow up in a separate visit to go over MRI results and plan her epidural. I've also asked her to be very cognizant over the next several days as to exactly where in her feet or legs she feels the paresthesias.

## 2015-03-15 ENCOUNTER — Telehealth: Payer: Self-pay | Admitting: Sports Medicine

## 2015-03-15 NOTE — Telephone Encounter (Signed)
Called [employer] [phone]) to get authorization for MRI Lumbar Spine wo Contrast per Darlen Round with [facility], obtained authorization ([id number]) for dates (03/15/15-04/13/15). Call places to [facility], spoke with Apolonio Schneiders and informed Patient's imaging had been authorized. Imaging will contact Patient to schedule. No further questions.

## 2015-03-18 ENCOUNTER — Ambulatory Visit (INDEPENDENT_AMBULATORY_CARE_PROVIDER_SITE_OTHER): Payer: BLUE CROSS/BLUE SHIELD

## 2015-03-18 DIAGNOSIS — M47896 Other spondylosis, lumbar region: Secondary | ICD-10-CM

## 2015-03-18 DIAGNOSIS — M5136 Other intervertebral disc degeneration, lumbar region: Secondary | ICD-10-CM

## 2015-03-26 ENCOUNTER — Ambulatory Visit (INDEPENDENT_AMBULATORY_CARE_PROVIDER_SITE_OTHER): Payer: BLUE CROSS/BLUE SHIELD | Admitting: Sports Medicine

## 2015-03-26 ENCOUNTER — Encounter: Payer: Self-pay | Admitting: Sports Medicine

## 2015-03-26 DIAGNOSIS — M5136 Other intervertebral disc degeneration, lumbar region: Secondary | ICD-10-CM | POA: Diagnosis not present

## 2015-03-26 DIAGNOSIS — M51369 Other intervertebral disc degeneration, lumbar region without mention of lumbar back pain or lower extremity pain: Secondary | ICD-10-CM

## 2015-03-26 MED ORDER — DIAZEPAM 5 MG PO TABS: ORAL_TABLET | ORAL | Status: DC

## 2015-03-26 NOTE — Addendum Note (Signed)
Addended by: Silverio Decamp on: 03/26/2015 11:23 AM   Modules accepted: Orders

## 2015-03-26 NOTE — Progress Notes (Signed)

## 2015-03-26 NOTE — Assessment & Plan Note (Addendum)
Custom orthotics as above. MRI does show L3-L4 and L4-L5 degenerative disc disease with facet arthritis. We are going to proceed initially with a left sided L3-L4 interlaminar epidural. Certainly we can target facets if she has insufficient response. Return to see me 3 weeks after the injection. Valium for preprocedural anxiolysis.

## 2015-03-28 ENCOUNTER — Telehealth: Payer: Self-pay

## 2015-03-28 NOTE — Telephone Encounter (Signed)
Patient called stated that she was supposed to get an epidural injection but she cannot get it because she went to a seminar a few days ago for bariatric surgery and cannot have any steroids for 3 months prior to her surgery. She wants to know of any alternatives. Rhonda Cunningham,CMA

## 2015-03-28 NOTE — Telephone Encounter (Signed)
Spoke to patient she stated that her surgery is going to be through [facility] with Dr. Evorn Gong, Dr. Volanda Napoleon, or Dr. Valetta Close she stated that it will be at least 2 months before the surgery. Please advise if epidural needs to be cancelled. Marques Ericson,CMA

## 2015-03-28 NOTE — Telephone Encounter (Signed)
Who is her bariatric surgeon, I will call them and see if we can make an exception for injection.  Most likely they meant oral steroids.

## 2015-04-05 NOTE — Telephone Encounter (Signed)
Epidural is fine, just no oral steroids.

## 2015-04-05 NOTE — Telephone Encounter (Signed)
Spoke to patient advised her that it was ok to get epidural injection but no oral steroids. Maalik Pinn,CMA

## 2015-04-08 ENCOUNTER — Ambulatory Visit
Admission: RE | Admit: 2015-04-08 | Discharge: 2015-04-08 | Disposition: A | Discharge status: Home / Self Care | Payer: BLUE CROSS/BLUE SHIELD | Source: Ambulatory Visit | Attending: Sports Medicine | Admitting: Sports Medicine

## 2015-04-08 DIAGNOSIS — M5136 Other intervertebral disc degeneration, lumbar region: Secondary | ICD-10-CM

## 2015-04-08 MED ORDER — IOHEXOL 180 MG/ML  SOLN
1.0000 mL | Freq: Once | INTRAMUSCULAR | Status: AC | PRN
  Administered 2015-04-08: 1 mL via EPIDURAL

## 2015-04-08 MED ORDER — METHYLPREDNISOLONE ACETATE 40 MG/ML INJ SUSP (RADIOLOG
120.0000 mg | Freq: Once | INTRAMUSCULAR | Status: AC
  Administered 2015-04-08: 120 mg via EPIDURAL

## 2015-04-08 NOTE — Discharge Instructions (Signed)

## 2015-04-11 DIAGNOSIS — E7800 Pure hypercholesterolemia, unspecified: Secondary | ICD-10-CM | POA: Insufficient documentation

## 2015-04-11 DIAGNOSIS — Z87442 Personal history of urinary calculi: Secondary | ICD-10-CM | POA: Insufficient documentation

## 2015-04-11 DIAGNOSIS — G479 Sleep disorder, unspecified: Secondary | ICD-10-CM | POA: Insufficient documentation

## 2015-04-11 DIAGNOSIS — K219 Gastro-esophageal reflux disease without esophagitis: Secondary | ICD-10-CM | POA: Insufficient documentation

## 2015-04-11 DIAGNOSIS — R0683 Snoring: Secondary | ICD-10-CM | POA: Insufficient documentation

## 2015-05-07 ENCOUNTER — Ambulatory Visit (INDEPENDENT_AMBULATORY_CARE_PROVIDER_SITE_OTHER): Payer: BLUE CROSS/BLUE SHIELD | Admitting: Sports Medicine

## 2015-05-07 ENCOUNTER — Encounter: Payer: Self-pay | Admitting: Sports Medicine

## 2015-05-07 DIAGNOSIS — M5136 Other intervertebral disc degeneration, lumbar region: Secondary | ICD-10-CM | POA: Diagnosis not present

## 2015-05-07 DIAGNOSIS — M51369 Other intervertebral disc degeneration, lumbar region without mention of lumbar back pain or lower extremity pain: Secondary | ICD-10-CM

## 2015-05-07 MED ORDER — IBUPROFEN-FAMOTIDINE 800-26.6 MG PO TABS: 1.0000 | ORAL_TABLET | Freq: Three times a day (TID) | ORAL | Status: DC

## 2015-05-07 NOTE — Assessment & Plan Note (Signed)
Good the temporary response to a left L3-L4 interlaminar epidural. We are going to repeat with #2 in the series, and add ibuprofen/famotidine for baseline pain relief.  Return in one month.

## 2015-05-07 NOTE — Progress Notes (Signed)
  Subjective:    CC: Follow-up after her epidural  HPI: This is a pleasant 44 year old female, she had a left-sided L3-L4 interlaminar epidural for discogenic back pain, she had approximately one month with complete relief. She's not using any NSAIDs,  narcotics or muscle relaxers. Happy with results unfortunately pain has recurred. Mild, persistent.  Past medical history, Surgical history, Family history not pertinant except as noted below, Social history, Allergies, and medications have been entered into the medical record, reviewed, and no changes needed.   Review of Systems: No fevers, chills, night sweats, weight loss, chest pain, or shortness of breath.   Objective:    General: Well Developed, well nourished, and in no acute distress.  Neuro: Alert and oriented x3, extra-ocular muscles intact, sensation grossly intact.  HEENT: Normocephalic, atraumatic, pupils equal round reactive to light, neck supple, no masses, no lymphadenopathy, thyroid nonpalpable.  Skin: Warm and dry, no rashes. Cardiac: Regular rate and rhythm, no murmurs rubs or gallops, no lower extremity edema.  Respiratory: Clear to auscultation bilaterally. Not using accessory muscles, speaking in full sentences.   MRI again reviewed, there are some very minor disc protrusions, worst at the L3-L4 level, she also has some left-sided facet cysts at multiple levels.  Impression and Recommendations:

## 2015-05-10 ENCOUNTER — Ambulatory Visit
Admission: RE | Admit: 2015-05-10 | Discharge: 2015-05-10 | Disposition: A | Discharge status: Home / Self Care | Payer: BLUE CROSS/BLUE SHIELD | Source: Ambulatory Visit | Attending: Sports Medicine | Admitting: Sports Medicine

## 2015-05-10 VITALS — BP 122/82 | HR 80

## 2015-05-10 DIAGNOSIS — M5136 Other intervertebral disc degeneration, lumbar region: Secondary | ICD-10-CM

## 2015-05-10 MED ORDER — IOHEXOL 180 MG/ML  SOLN
1.0000 mL | Freq: Once | INTRAMUSCULAR | Status: AC | PRN
  Administered 2015-05-10: 1 mL via EPIDURAL

## 2015-05-10 MED ORDER — METHYLPREDNISOLONE ACETATE 40 MG/ML INJ SUSP (RADIOLOG
120.0000 mg | Freq: Once | INTRAMUSCULAR | Status: AC
  Administered 2015-05-10: 120 mg via EPIDURAL

## 2015-05-10 NOTE — Discharge Instructions (Signed)

## 2015-05-17 ENCOUNTER — Encounter: Payer: Self-pay | Admitting: *Deleted

## 2015-06-02 ENCOUNTER — Other Ambulatory Visit: Payer: Self-pay | Admitting: Family Medicine

## 2015-06-06 ENCOUNTER — Ambulatory Visit: Payer: BLUE CROSS/BLUE SHIELD | Admitting: Family Medicine

## 2015-06-27 ENCOUNTER — Encounter: Payer: Self-pay | Admitting: Sports Medicine

## 2015-06-27 ENCOUNTER — Ambulatory Visit (INDEPENDENT_AMBULATORY_CARE_PROVIDER_SITE_OTHER): Payer: BLUE CROSS/BLUE SHIELD | Admitting: Sports Medicine

## 2015-06-27 VITALS — BP 113/72 | HR 81 | Ht 67.5 in | Wt 259.0 lb

## 2015-06-27 DIAGNOSIS — R229 Localized swelling, mass and lump, unspecified: Secondary | ICD-10-CM | POA: Diagnosis not present

## 2015-06-27 NOTE — Assessment & Plan Note (Signed)
Today's near the right pes anserine bursa. Nontender. This is likely a lipoma.  She has multiple other lipomas, unofficial ultrasound today showed good compressibility of the draining veins. Negative Homans sign. I am going to give her some past anserine rehabilitation exercises as well as hip flexor rehabilitation as she did complain of hip flexor type pain. Return if no better in a month. Certainly lipomas can be injected with steroids or undergo simple surgical excision.

## 2015-06-27 NOTE — Progress Notes (Signed)
  Subjective:    CC:  Leg swelling  HPI: Right-sided leg swelling: just distal to the anteromedial joint line of the knee, no pain, no calf swelling. No trauma, she has several lipomas elsewhere. Symptoms are mild, persistent. She does have a history of venous thrombosis and this feels nothing like it. No trauma, no periods of immobilization.  Past medical history, Surgical history, Family history not pertinant except as noted below, Social history, Allergies, and medications have been entered into the medical record, reviewed, and no changes needed.   Review of Systems: No fevers, chills, night sweats, weight loss, chest pain, or shortness of breath.   Objective:    General: Well Developed, well nourished, and in no acute distress.  Neuro: Alert and oriented x3, extra-ocular muscles intact, sensation grossly intact.  HEENT: Normocephalic, atraumatic, pupils equal round reactive to light, neck supple, no masses, no lymphadenopathy, thyroid nonpalpable.  Skin: Warm and dry, no rashes. Cardiac: Regular rate and rhythm, no murmurs rubs or gallops, no lower extremity edema.  Respiratory: Clear to auscultation bilaterally. Not using accessory muscles, speaking in full sentences. Right lower leg: visible fullness near the past sensory and bursa, minimally tender, no calf swelling, pain, good pulses, negative Homans sign bilaterally.  Unofficial ultrasound showed good compressibility of the lower veins including the great saphenous. The area of concern appear to be simply subcutaneous tissue  Impression and Recommendations:    I spent 40 minutes with this patient, greater than 50% was face-to-face time counseling regarding the above diagnosis

## 2015-07-03 DIAGNOSIS — Z87891 Personal history of nicotine dependence: Secondary | ICD-10-CM | POA: Insufficient documentation

## 2015-07-04 LAB — HM MAMMOGRAPHY

## 2015-07-10 ENCOUNTER — Ambulatory Visit (INDEPENDENT_AMBULATORY_CARE_PROVIDER_SITE_OTHER): Payer: BLUE CROSS/BLUE SHIELD | Admitting: Family Medicine

## 2015-07-10 ENCOUNTER — Encounter: Payer: Self-pay | Admitting: Family Medicine

## 2015-07-10 VITALS — BP 125/80 | HR 77 | Wt 264.0 lb

## 2015-07-10 DIAGNOSIS — N390 Urinary tract infection, site not specified: Secondary | ICD-10-CM | POA: Insufficient documentation

## 2015-07-10 DIAGNOSIS — N3001 Acute cystitis with hematuria: Secondary | ICD-10-CM | POA: Diagnosis not present

## 2015-07-10 LAB — POCT URINALYSIS DIPSTICK
Bilirubin, UA: NEGATIVE
GLUCOSE UA: NEGATIVE
KETONES UA: NEGATIVE
Nitrite, UA: POSITIVE
Protein, UA: 30
Spec Grav, UA: 1.025
UROBILINOGEN UA: 0.2
pH, UA: 6.5

## 2015-07-10 MED ORDER — CEPHALEXIN 500 MG PO CAPS: 500.0000 mg | ORAL_CAPSULE | Freq: Two times a day (BID) | ORAL | Status: DC

## 2015-07-10 NOTE — Addendum Note (Signed)
Addended by: Darla Lesches T on: 07/10/2015 09:48 AM   Modules accepted: Orders

## 2015-07-10 NOTE — Patient Instructions (Signed)
Thank you for coming in today. Take Keflex twice daily If your belly pain worsens, or you have high fever, bad vomiting, blood in your stool or black tarry stool go to the Emergency Room.

## 2015-07-10 NOTE — Progress Notes (Addendum)
Michaela Dodson is a 44 y.o. female who presents to [facility]  today for Urinary frequency urgency dysuria and mild incontinence. Symptoms present for a few days and are consistent with her history of frequent UTIs. No fevers chills nausea vomiting diarrhea or abdominal pain. No treatment tried yet.   Past Medical History  Diagnosis Date  . Allergy   . Depression   . GERD (gastroesophageal reflux disease)   . Obesity   . H/O thromboembolism     superficial-left medial thigh lipoma, vericose veins   Past Surgical History  Procedure Laterality Date  . Tubal ligation    . Breast biopsy    . Uterine ablation    . Carpal tunnel release Right 09/2012    Dr. Apolonio Schneiders  . Carpal tunnel release Left 11/2013    Dr. Apolonio Schneiders   History  Substance Use Topics  . Smoking status: Current Some Day Smoker    Types: Cigarettes  . Smokeless tobacco: Not on file     Comment: e-cigs  . Alcohol Use: Yes   ROS as above Medications: Current Outpatient Prescriptions  Medication Sig Dispense Refill  . ARIPiprazole (ABILIFY) 5 MG tablet Take 5 mg by mouth.    Marland Kitchen buPROPion (WELLBUTRIN XL) 150 MG 24 hr tablet TAKE 1 TABLET (150 MG TOTAL) BY MOUTH EVERY MORNING. 30 tablet 2  . cephALEXin (KEFLEX) 500 MG capsule Take 1 capsule (500 mg total) by mouth 2 (two) times daily. 14 capsule 0  . Ibuprofen-Famotidine 800-26.6 MG TABS Take 1 tablet by mouth 3 (three) times daily. 27 tablet 0  . omeprazole (PRILOSEC) 40 MG capsule Take 1 capsule (40 mg total) by mouth daily. 30 capsule 6  . PARoxetine (PAXIL) 20 MG tablet Take 20 mg by mouth daily.    . Probiotic Product (PROBIOTIC DAILY PO) Take by mouth.    . spironolactone (ALDACTONE) 100 MG tablet TAKE 1 TABLET (100 MG TOTAL) BY MOUTH DAILY. 90 tablet 1   No current facility-administered medications for this visit.   No Known Allergies   Exam:  BP 125/80 mmHg  Pulse 77  Wt 264 lb (119.75 kg) Gen: Well NAD HEENT: EOMI,   MMM Lungs: Normal work of breathing. CTABL Heart: RRR no MRG Abd: NABS, Soft. Nondistended, Nontender no CV angle tenderness to percussion Exts: Brisk capillary refill, warm and well perfused.   Urinalysis positive for large blood, positive nitrates, moderate leukocyte esterase.  No results found for this or any previous visit (from the past 24 hour(s)). No results found.   Please see individual assessment and plan sections. This visit required moderate complexity and decision making.

## 2015-07-10 NOTE — Assessment & Plan Note (Signed)
UTI. Culture pending. Treat with Keflex. Exacerbation of chronic problem. Moderate complexity.

## 2015-07-13 LAB — URINE CULTURE

## 2015-07-13 NOTE — Progress Notes (Signed)
Quick Note:  Urine culture as expected. no changes. ______

## 2015-07-17 ENCOUNTER — Encounter: Payer: Self-pay | Admitting: Family Medicine

## 2015-07-24 ENCOUNTER — Encounter: Payer: Self-pay | Admitting: Family Medicine

## 2015-07-25 ENCOUNTER — Ambulatory Visit: Payer: BLUE CROSS/BLUE SHIELD | Admitting: Sports Medicine

## 2015-08-21 ENCOUNTER — Encounter: Payer: Self-pay | Admitting: Family Medicine

## 2015-08-21 ENCOUNTER — Other Ambulatory Visit: Payer: Self-pay | Admitting: Family Medicine

## 2015-08-21 ENCOUNTER — Ambulatory Visit (INDEPENDENT_AMBULATORY_CARE_PROVIDER_SITE_OTHER): Payer: BLUE CROSS/BLUE SHIELD | Admitting: Family Medicine

## 2015-08-21 VITALS — BP 114/81 | HR 78 | Temp 97.5°F | Wt 251.0 lb

## 2015-08-21 DIAGNOSIS — N3001 Acute cystitis with hematuria: Secondary | ICD-10-CM

## 2015-08-21 DIAGNOSIS — N949 Unspecified condition associated with female genital organs and menstrual cycle: Secondary | ICD-10-CM

## 2015-08-21 DIAGNOSIS — R300 Dysuria: Secondary | ICD-10-CM

## 2015-08-21 LAB — POCT URINALYSIS DIPSTICK
Glucose, UA: NEGATIVE
KETONES UA: 80
NITRITE UA: NEGATIVE
PH UA: 5.5
PROTEIN UA: NEGATIVE
Spec Grav, UA: >=1.03
Urobilinogen, UA: 0.2

## 2015-08-21 MED ORDER — CEFDINIR 300 MG PO CAPS
300.0000 mg | ORAL_CAPSULE | Freq: Two times a day (BID) | ORAL | Status: DC
Start: 2015-08-21 — End: 2015-09-16

## 2015-08-21 NOTE — Patient Instructions (Signed)
Thank you for coming in today. Take Omnicef twice daily for UTI. We will call if anything comes up with labs. If your belly pain worsens, or you have high fever, bad vomiting, blood in your stool or black tarry stool go to the Emergency Room.   Urinary Tract Infection Urinary tract infections (UTIs) can develop anywhere along your urinary tract. Your urinary tract is your body's drainage system for removing wastes and extra water. Your urinary tract includes two kidneys, two ureters, a bladder, and a urethra. Your kidneys are a pair of bean-shaped organs. Each kidney is about the size of your fist. They are located below your ribs, one on each side of your spine. CAUSES Infections are caused by microbes, which are microscopic organisms, including fungi, viruses, and bacteria. These organisms are so small that they can only be seen through a microscope. Bacteria are the microbes that most commonly cause UTIs. SYMPTOMS  Symptoms of UTIs may vary by age and gender of the patient and by the location of the infection. Symptoms in young women typically include a frequent and intense urge to urinate and a painful, burning feeling in the bladder or urethra during urination. Older women and men are more likely to be tired, shaky, and weak and have muscle aches and abdominal pain. A fever may mean the infection is in your kidneys. Other symptoms of a kidney infection include pain in your back or sides below the ribs, nausea, and vomiting. DIAGNOSIS To diagnose a UTI, your caregiver will ask you about your symptoms. Your caregiver also will ask to provide a urine sample. The urine sample will be tested for bacteria and white blood cells. White blood cells are made by your body to help fight infection. TREATMENT  Typically, UTIs can be treated with medication. Because most UTIs are caused by a bacterial infection, they usually can be treated with the use of antibiotics. The choice of antibiotic and length of  treatment depend on your symptoms and the type of bacteria causing your infection. HOME CARE INSTRUCTIONS  If you were prescribed antibiotics, take them exactly as your caregiver instructs you. Finish the medication even if you feel better after you have only taken some of the medication.  Drink enough water and fluids to keep your urine clear or pale yellow.  Avoid caffeine, tea, and carbonated beverages. They tend to irritate your bladder.  Empty your bladder often. Avoid holding urine for long periods of time.  Empty your bladder before and after sexual intercourse.  After a bowel movement, women should cleanse from front to back. Use each tissue only once. SEEK MEDICAL CARE IF:   You have back pain.  You develop a fever.  Your symptoms do not begin to resolve within 3 days. SEEK IMMEDIATE MEDICAL CARE IF:   You have severe back pain or lower abdominal pain.  You develop chills.  You have nausea or vomiting.  You have continued burning or discomfort with urination. MAKE SURE YOU:   Understand these instructions.  Will watch your condition.  Will get help right away if you are not doing well or get worse. Document Released: 09/23/2005 Document Revised: 06/14/2012 Document Reviewed: 01/22/2012 [facility] Patient Information 2015 [place], [place]. This information is not intended to replace advice given to you by your health care provider. Make sure you discuss any questions you have with your health care provider.

## 2015-08-21 NOTE — Assessment & Plan Note (Signed)
Symptoms most consistent with UTI. Urine culture as well as wet prep pending. Empiric treatment with Omnicef.

## 2015-08-21 NOTE — Progress Notes (Signed)
Michaela Dodson is a 44 y.o. female who presents to [facility]  today for urinary frequency urgency dysuria and vaginal burning. Symptoms present for 2 days. Patient had. Surgery 8 days ago. She additionally had some mild vaginal discharge and was treated with Monistat as well as fluconazole. The vaginal discharges since resolved. She had a urinary tract infection a month ago that was well treated with Keflex. No fevers chills nausea vomiting diarrhea or abdominal pain.   Past Medical History  Diagnosis Date  . Allergy   . Depression   . GERD (gastroesophageal reflux disease)   . Obesity   . H/O thromboembolism     superficial-left medial thigh lipoma, vericose veins   Past Surgical History  Procedure Laterality Date  . Tubal ligation    . Breast biopsy    . Uterine ablation    . Carpal tunnel release Right 09/2012    Dr. Apolonio Schneiders  . Carpal tunnel release Left 11/2013    Dr. Apolonio Schneiders   Social History  Substance Use Topics  . Smoking status: Current Some Day Smoker    Types: Cigarettes  . Smokeless tobacco: Not on file     Comment: e-cigs  . Alcohol Use: Yes   family history includes Bladder Cancer in her mother; Breast cancer in her mother.  ROS as above Medications: Current Outpatient Prescriptions  Medication Sig Dispense Refill  . ARIPiprazole (ABILIFY) 5 MG tablet Take 5 mg by mouth.    Marland Kitchen buPROPion (WELLBUTRIN XL) 150 MG 24 hr tablet TAKE 1 TABLET (150 MG TOTAL) BY MOUTH EVERY MORNING. 30 tablet 2  . cephALEXin (KEFLEX) 500 MG capsule Take 1 capsule (500 mg total) by mouth 2 (two) times daily. 14 capsule 0  . Ibuprofen-Famotidine 800-26.6 MG TABS Take 1 tablet by mouth 3 (three) times daily. 27 tablet 0  . omeprazole (PRILOSEC) 40 MG capsule Take 1 capsule (40 mg total) by mouth daily. 30 capsule 6  . PARoxetine (PAXIL) 20 MG tablet Take 20 mg by mouth daily.    . Probiotic Product (PROBIOTIC DAILY PO) Take by mouth.    .  spironolactone (ALDACTONE) 100 MG tablet TAKE 1 TABLET (100 MG TOTAL) BY MOUTH DAILY. 90 tablet 1  . cefdinir (OMNICEF) 300 MG capsule Take 1 capsule (300 mg total) by mouth 2 (two) times daily. 14 capsule 0   No current facility-administered medications for this visit.   No Known Allergies   Exam:  BP 114/81 mmHg  Pulse 78  Temp(Src) 97.5 F (36.4 C) (Oral)  Wt 251 lb (113.853 kg) Gen: Well NAD HEENT: EOMI,  MMM Lungs: Normal work of breathing. CTABL Heart: RRR no MRG Abd: NABS, Soft. Nondistended, no CVA tenderness to percussion Exts: Brisk capillary refill, warm and well perfused.   Results for orders placed or performed in visit on 08/21/15 (from the past 24 hour(s))  POCT Urinalysis Dipstick     Status: Abnormal   Collection Time: 08/21/15  2:25 PM  Result Value Ref Range   Color, UA yellow    Clarity, UA clear    Glucose, UA neg    Bilirubin, UA small    Ketones, UA 80    Spec Grav, UA >=1.030    Blood, UA large    pH, UA 5.5    Protein, UA neg    Urobilinogen, UA 0.2    Nitrite, UA neg    Leukocytes, UA Trace (A) Negative   No results found.   Please  see individual assessment and plan sections.

## 2015-08-22 LAB — WET PREP FOR TRICH, YEAST, CLUE
Trich, Wet Prep: NONE SEEN
WBC WET PREP: NONE SEEN
Yeast Wet Prep HPF POC: NONE SEEN

## 2015-08-23 LAB — URINE CULTURE
Colony Count: NO GROWTH
Organism ID, Bacteria: NO GROWTH

## 2015-08-23 MED ORDER — METRONIDAZOLE 500 MG PO TABS: 500.0000 mg | ORAL_TABLET | Freq: Two times a day (BID) | ORAL | Status: DC

## 2015-08-23 NOTE — Addendum Note (Signed)
Addended by: Gregor Hams on: 08/23/2015 08:12 AM   Modules accepted: Orders

## 2015-08-25 ENCOUNTER — Other Ambulatory Visit: Payer: Self-pay | Admitting: Family Medicine

## 2015-09-16 ENCOUNTER — Ambulatory Visit (INDEPENDENT_AMBULATORY_CARE_PROVIDER_SITE_OTHER): Payer: BLUE CROSS/BLUE SHIELD

## 2015-09-16 ENCOUNTER — Other Ambulatory Visit: Payer: Self-pay | Admitting: Sports Medicine

## 2015-09-16 ENCOUNTER — Ambulatory Visit (HOSPITAL_BASED_OUTPATIENT_CLINIC_OR_DEPARTMENT_OTHER)
Admission: RE | Admit: 2015-09-16 | Discharge: 2015-09-16 | Disposition: A | Discharge status: Home / Self Care | Payer: BLUE CROSS/BLUE SHIELD | Source: Ambulatory Visit | Attending: Sports Medicine | Admitting: Sports Medicine

## 2015-09-16 ENCOUNTER — Encounter: Payer: Self-pay | Admitting: Sports Medicine

## 2015-09-16 ENCOUNTER — Ambulatory Visit (INDEPENDENT_AMBULATORY_CARE_PROVIDER_SITE_OTHER): Payer: BLUE CROSS/BLUE SHIELD | Admitting: Sports Medicine

## 2015-09-16 VITALS — BP 126/80 | HR 69 | Wt 239.0 lb

## 2015-09-16 DIAGNOSIS — M5412 Radiculopathy, cervical region: Secondary | ICD-10-CM

## 2015-09-16 DIAGNOSIS — M79662 Pain in left lower leg: Secondary | ICD-10-CM

## 2015-09-16 DIAGNOSIS — M79605 Pain in left leg: Secondary | ICD-10-CM | POA: Diagnosis not present

## 2015-09-16 DIAGNOSIS — I82812 Embolism and thrombosis of superficial veins of left lower extremities: Secondary | ICD-10-CM

## 2015-09-16 DIAGNOSIS — M5032 Other cervical disc degeneration, mid-cervical region: Secondary | ICD-10-CM | POA: Diagnosis not present

## 2015-09-16 DIAGNOSIS — I82890 Acute embolism and thrombosis of other specified veins: Secondary | ICD-10-CM | POA: Insufficient documentation

## 2015-09-16 MED ORDER — PREDNISONE 50 MG PO TABS: ORAL_TABLET | ORAL | Status: DC

## 2015-09-16 NOTE — Progress Notes (Signed)
   Subjective:    I'm seeing this patient as a consultation for: Dr. [name]   CC: left arm numbness and tingling, left leg pain  HPI: For the past several weeks this pleasant 44 year old female with a history of lumbar degenerative disc disease has noted numbness and tingling radiating from the neck, down the posterior left arm, dorsum of the left forearm to the ulnar aspect of the wrist and to all fingers. She endorses predominantly third through fifth finger paresthesias, moderate, persistent.  She also recalls a few days ago squatting, and feeling a pop on the posterior aspect of her left knee with subsequent induration, and pain in the posterior medial gastrocnemius. Moderate, persistent, no lower leg swelling. No shortness of breath, no chest pain.  Past medical history, Surgical history, Family history not pertinant except as noted below, Social history, Allergies, and medications have been entered into the medical record, reviewed, and no changes needed.   Review of Systems: No headache, visual changes, nausea, vomiting, diarrhea, constipation, dizziness, abdominal pain, skin rash, fevers, chills, night sweats, weight loss, swollen lymph nodes, body aches, joint swelling, muscle aches, chest pain, shortness of breath, mood changes, visual or auditory hallucinations.   Objective:   General: Well Developed, well nourished, and in no acute distress.  Neuro/Psych: Alert and oriented x3, extra-ocular muscles intact, able to move all 4 extremities, sensation grossly intact. Skin: Warm and dry, no rashes noted.  Respiratory: Not using accessory muscles, speaking in full sentences, trachea midline.  Cardiovascular: Pulses palpable, no extremity edema. Abdomen: Does not appear distended. Neck: Positive Spurling sign with reproduction of left-sided C7 and C8 radiculopathy. Full neck range of motion Grip strength and sensation normal in bilateral hands Strength good C4 to T1  distribution No sensory change to C4 to T1 Reflexes normal Negative Tinel's and Phalen sign. Left leg: Tender to palpation along the muscle belly of the medial head of the gastrocnemius, no tenderness at the joint line and no palpable effusion. There is minimal induration over the tender area, I am unable to palpate a Baker's cyst, she is a negative Homans sign bilaterally and a negative Thompson's test bilaterally.  The left knee and calf were strapped with compressive dressing and heel lifts were placed in both shoes bilaterally.  Impression and Recommendations:   This case required medical decision making of moderate complexity.

## 2015-09-16 NOTE — Addendum Note (Signed)
Addended by: Silverio Decamp on: 09/16/2015 05:03 PM   Modules accepted: Orders

## 2015-09-16 NOTE — Assessment & Plan Note (Addendum)
Gastrocnemius strain versus ruptured Baker's cyst. Official ultrasound, patient is a history of deep vein thrombosis, strapped with compressive dressing, bilateral heel lift.  Official ultrasound to show a superficial venous thrombosis in the saphenous vein, no evidence of deep vein thrombosis and no clot at the saphenofemoral junction. This can simply be treated with compression, she is post-gastric sleeve and is unable to use NSAIDs.

## 2015-09-16 NOTE — Assessment & Plan Note (Signed)
Prednisone, x-rays, formal PT. Return in one month, MRI for intervention if no better, left C7 versus C8 distribution

## 2015-09-18 DIAGNOSIS — Z903 Acquired absence of stomach [part of]: Secondary | ICD-10-CM | POA: Insufficient documentation

## 2016-02-05 ENCOUNTER — Other Ambulatory Visit: Payer: Self-pay | Admitting: *Deleted

## 2016-02-05 MED ORDER — OSELTAMIVIR PHOSPHATE 75 MG PO CAPS: 75.0000 mg | ORAL_CAPSULE | Freq: Every day | ORAL | Status: DC

## 2016-05-07 ENCOUNTER — Telehealth: Payer: Self-pay | Admitting: *Deleted

## 2016-05-07 DIAGNOSIS — M51369 Other intervertebral disc degeneration, lumbar region without mention of lumbar back pain or lower extremity pain: Secondary | ICD-10-CM

## 2016-05-07 DIAGNOSIS — M5136 Other intervertebral disc degeneration, lumbar region: Secondary | ICD-10-CM

## 2016-05-07 NOTE — Telephone Encounter (Signed)
Pt was in the office today with her son & wanted to ask about possibly getting another injection in her back.  She's going on vacation in 3 weeks & was wondering if she would be able to get one before then.  Please advise.

## 2016-05-07 NOTE — Telephone Encounter (Signed)
Ordered repeat left L3-L4 interlaminar epidural,   Please schedule with [facility] imaging

## 2016-05-08 NOTE — Telephone Encounter (Signed)
[facility] contacted.  Pt aware.

## 2016-05-19 ENCOUNTER — Ambulatory Visit
Admission: RE | Admit: 2016-05-19 | Discharge: 2016-05-19 | Disposition: A | Discharge status: Home / Self Care | Payer: BLUE CROSS/BLUE SHIELD | Source: Ambulatory Visit | Attending: Sports Medicine | Admitting: Sports Medicine

## 2016-05-19 DIAGNOSIS — F3110 Bipolar disorder, current episode manic without psychotic features, unspecified: Secondary | ICD-10-CM | POA: Diagnosis not present

## 2016-05-19 DIAGNOSIS — M47817 Spondylosis without myelopathy or radiculopathy, lumbosacral region: Secondary | ICD-10-CM | POA: Diagnosis not present

## 2016-05-19 MED ORDER — IOPAMIDOL (ISOVUE-M 200) INJECTION 41%
1.0000 mL | Freq: Once | INTRAMUSCULAR | Status: AC
  Administered 2016-05-19: 1 mL via EPIDURAL

## 2016-05-19 MED ORDER — METHYLPREDNISOLONE ACETATE 40 MG/ML INJ SUSP (RADIOLOG
120.0000 mg | Freq: Once | INTRAMUSCULAR | Status: AC
  Administered 2016-05-19: 120 mg via EPIDURAL

## 2016-05-19 NOTE — Discharge Instructions (Signed)

## 2016-06-05 ENCOUNTER — Telehealth: Payer: Self-pay

## 2016-06-05 DIAGNOSIS — M51369 Other intervertebral disc degeneration, lumbar region without mention of lumbar back pain or lower extremity pain: Secondary | ICD-10-CM

## 2016-06-05 DIAGNOSIS — M5136 Other intervertebral disc degeneration, lumbar region: Secondary | ICD-10-CM

## 2016-06-05 NOTE — Telephone Encounter (Signed)
Pt left VM stating she had Epidural 2 weeks ago with no relief and would like to know if she can have another one ordered. Please advise.

## 2016-06-08 ENCOUNTER — Ambulatory Visit: Payer: BLUE CROSS/BLUE SHIELD | Admitting: Physician Assistant

## 2016-06-08 DIAGNOSIS — R52 Pain, unspecified: Secondary | ICD-10-CM | POA: Diagnosis not present

## 2016-06-08 DIAGNOSIS — R51 Headache: Secondary | ICD-10-CM | POA: Diagnosis not present

## 2016-06-08 DIAGNOSIS — M1990 Unspecified osteoarthritis, unspecified site: Secondary | ICD-10-CM | POA: Diagnosis not present

## 2016-06-08 DIAGNOSIS — Z79899 Other long term (current) drug therapy: Secondary | ICD-10-CM | POA: Diagnosis not present

## 2016-06-08 DIAGNOSIS — F3110 Bipolar disorder, current episode manic without psychotic features, unspecified: Secondary | ICD-10-CM | POA: Diagnosis not present

## 2016-06-08 DIAGNOSIS — Z9851 Tubal ligation status: Secondary | ICD-10-CM | POA: Diagnosis not present

## 2016-06-08 DIAGNOSIS — R110 Nausea: Secondary | ICD-10-CM | POA: Diagnosis not present

## 2016-06-08 DIAGNOSIS — Z87442 Personal history of urinary calculi: Secondary | ICD-10-CM | POA: Diagnosis not present

## 2016-06-08 DIAGNOSIS — Z87891 Personal history of nicotine dependence: Secondary | ICD-10-CM | POA: Diagnosis not present

## 2016-06-08 DIAGNOSIS — F419 Anxiety disorder, unspecified: Secondary | ICD-10-CM | POA: Diagnosis not present

## 2016-06-08 DIAGNOSIS — F329 Major depressive disorder, single episode, unspecified: Secondary | ICD-10-CM | POA: Diagnosis not present

## 2016-06-08 DIAGNOSIS — K219 Gastro-esophageal reflux disease without esophagitis: Secondary | ICD-10-CM | POA: Diagnosis not present

## 2016-06-08 DIAGNOSIS — E669 Obesity, unspecified: Secondary | ICD-10-CM | POA: Diagnosis not present

## 2016-06-08 DIAGNOSIS — Z87440 Personal history of urinary (tract) infections: Secondary | ICD-10-CM | POA: Diagnosis not present

## 2016-06-08 DIAGNOSIS — Z6825 Body mass index (BMI) 25.0-25.9, adult: Secondary | ICD-10-CM | POA: Diagnosis not present

## 2016-06-08 DIAGNOSIS — Z9049 Acquired absence of other specified parts of digestive tract: Secondary | ICD-10-CM | POA: Diagnosis not present

## 2016-06-08 DIAGNOSIS — J45909 Unspecified asthma, uncomplicated: Secondary | ICD-10-CM | POA: Diagnosis not present

## 2016-06-08 NOTE — Telephone Encounter (Signed)
[facility] called and VM left informing pt Epidural ordered.

## 2016-06-08 NOTE — Telephone Encounter (Signed)
Epidural ordered.  

## 2016-06-09 DIAGNOSIS — R51 Headache: Secondary | ICD-10-CM | POA: Diagnosis not present

## 2016-06-10 ENCOUNTER — Telehealth: Payer: Self-pay | Admitting: *Deleted

## 2016-06-10 DIAGNOSIS — G971 Other reaction to spinal and lumbar puncture: Secondary | ICD-10-CM | POA: Diagnosis not present

## 2016-06-10 NOTE — Telephone Encounter (Signed)
[facility]ed the [facility] and had the on call provider paged. Dr. Salli Quarry called back and stated that the pt should hydrate, use ice, drink caffeine, and ;use firocet for her pain. Also if she is not better in 24-48 hours to call. Called pt back and gave her the recommendations.Maryruth Eve, Lahoma Crocker

## 2016-06-10 NOTE — Telephone Encounter (Signed)
Pt called and spoke w/Karen regarding not feeling well. She informed her that she had a blood patch done today and wanted to know what to do. I called her back and advised that she contact the pain clinic. she told me that she had tried to call and was unable to reach anyone. I told her that I would call and see what I could find out.Maryruth Eve, Lahoma Crocker

## 2016-06-18 ENCOUNTER — Telehealth: Payer: Self-pay | Admitting: *Deleted

## 2016-06-18 NOTE — Telephone Encounter (Signed)
Pt called and wanted to know if Dr. Madilyn Fireman would write an Rx for Addyi. She stated that her psychiatrist will not write for this due to her being on other meds. I informed her that Dr. Madilyn Fireman is not here this week and that I will have to fwd this to her and she would not get back to her until sometime late next week and she would actually need to come in to discuss this with her being that this a new medication. Pt voiced understanding and agreed.Audelia Hives [place]

## 2016-06-19 DIAGNOSIS — R51 Headache: Secondary | ICD-10-CM | POA: Diagnosis not present

## 2016-06-22 NOTE — Telephone Encounter (Signed)
Unfortunately the psychiatrist is right.  It doesn't mix well with her medications.  Non drug treatments can include sex and couples therapy.

## 2016-06-22 NOTE — Telephone Encounter (Signed)
Called pt and informed her of Dr. Gardiner Ramus recommendations.Michaela Dodson

## 2016-07-01 DIAGNOSIS — R51 Headache: Secondary | ICD-10-CM | POA: Diagnosis not present

## 2016-07-01 DIAGNOSIS — G960 Cerebrospinal fluid leak: Secondary | ICD-10-CM | POA: Diagnosis not present

## 2016-07-29 DIAGNOSIS — K912 Postsurgical malabsorption, not elsewhere classified: Secondary | ICD-10-CM | POA: Diagnosis not present

## 2016-08-12 DIAGNOSIS — Z903 Acquired absence of stomach [part of]: Secondary | ICD-10-CM | POA: Diagnosis not present

## 2016-08-12 DIAGNOSIS — J4520 Mild intermittent asthma, uncomplicated: Secondary | ICD-10-CM | POA: Diagnosis not present

## 2016-08-12 DIAGNOSIS — M5137 Other intervertebral disc degeneration, lumbosacral region: Secondary | ICD-10-CM | POA: Diagnosis not present

## 2016-08-12 DIAGNOSIS — Z87891 Personal history of nicotine dependence: Secondary | ICD-10-CM | POA: Diagnosis not present

## 2016-08-17 ENCOUNTER — Encounter: Payer: Self-pay | Admitting: Obstetrics & Gynecology

## 2016-08-17 ENCOUNTER — Encounter: Payer: Self-pay | Admitting: Family Medicine

## 2016-08-17 ENCOUNTER — Ambulatory Visit (INDEPENDENT_AMBULATORY_CARE_PROVIDER_SITE_OTHER): Payer: BLUE CROSS/BLUE SHIELD | Admitting: Obstetrics & Gynecology

## 2016-08-17 VITALS — BP 106/66 | HR 63 | Ht 66.0 in | Wt 166.0 lb

## 2016-08-17 DIAGNOSIS — E669 Obesity, unspecified: Secondary | ICD-10-CM | POA: Diagnosis not present

## 2016-08-17 DIAGNOSIS — L298 Other pruritus: Secondary | ICD-10-CM | POA: Diagnosis not present

## 2016-08-17 DIAGNOSIS — N898 Other specified noninflammatory disorders of vagina: Secondary | ICD-10-CM

## 2016-08-17 DIAGNOSIS — Z1231 Encounter for screening mammogram for malignant neoplasm of breast: Secondary | ICD-10-CM | POA: Diagnosis not present

## 2016-08-17 DIAGNOSIS — Z01419 Encounter for gynecological examination (general) (routine) without abnormal findings: Secondary | ICD-10-CM

## 2016-08-17 NOTE — Progress Notes (Signed)
Subjective:    Michaela Dodson is a 45 y.o. MW P3 (23, 62, and 42 yo kids- 1 grandson)  female who presents for an annual exam. The patient has no complaints today. Recent increase in clear, non itchy vaginal discharge. The patient is sexually active. GYN screening history: last pap: was normal. The patient wears seatbelts: yes. The patient participates in regular exercise: yes. Has the patient ever been transfused or tattooed?: yes. The patient reports that there is not domestic violence in her life.   Menstrual History: OB History    Gravida Para Term Preterm AB Living   '4 3 3     3   ' SAB TAB Ectopic Multiple Live Births                  Menarche age: 47 No LMP recorded. Patient has had an ablation.    The following portions of the patient's history were reviewed and updated as appropriate: allergies, current medications, past family history, past medical history, past social history, past surgical history and problem list.  Review of Systems Pertinent items are noted in HPI. She has had no periods in about 12 years since she had an ablation. She lost 100# after a gastric sleeve 2016. Married for 20 years, some dyspareunia, positional, for several years, using [place]. 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 Mammogram done today. Mother with breast cancer (no BRCA testing due to insurance)   Objective:    BP 106/66   Pulse 63   Ht '5\' 6"'  (1.676 m)   Wt 166 lb (75.3 kg)   BMI 26.79 kg/m   General Appearance:    Alert, cooperative, no distress, appears stated age  Head:    Normocephalic, without obvious abnormality, atraumatic  Eyes:    PERRL, conjunctiva/corneas clear, EOM's intact, fundi    benign, both eyes  Ears:    Normal TM's and external ear canals, both ears  Nose:   Nares normal, septum midline, mucosa normal, no drainage    or sinus tenderness  Throat:   Lips, mucosa, and tongue normal; teeth and gums normal  Neck:   Supple, symmetrical, trachea midline, no  adenopathy;    thyroid:  no enlargement/tenderness/nodules; no carotid   bruit or JVD  Back:     Symmetric, no curvature, ROM normal, no CVA tenderness  Lungs:     Clear to auscultation bilaterally, respirations unlabored  Chest Wall:    No tenderness or deformity   Heart:    Regular rate and rhythm, S1 and S2 normal, no murmur, rub   or gallop  Breast Exam:    No tenderness, masses, or nipple abnormality  Abdomen:     Soft, non-tender, bowel sounds active all four quadrants,    no masses, no organomegaly  Genitalia:    Normal female without lesion, discharge or tenderness, NSSA, NT, mobile, no adnexal masses (with the bimanual exam, she felt the same discomfort ("like he's hitting something") as with sex.      Extremities:   Extremities normal, atraumatic, no cyanosis or edema  Pulses:   2+ and symmetric all extremities  Skin:   Skin color, texture, turgor normal, no rashes or lesions  Lymph nodes:   Cervical, supraclavicular, and axillary nodes normal  Neurologic:   CNII-XII intact, normal strength, sensation and reflexes    throughout   .    Assessment:    Healthy female exam.    Plan:     Pap smears per ACOG recs (2019)  Rec position changes with sex

## 2016-08-19 LAB — URINE CULTURE

## 2016-08-20 ENCOUNTER — Telehealth: Payer: Self-pay | Admitting: *Deleted

## 2016-08-20 DIAGNOSIS — N390 Urinary tract infection, site not specified: Secondary | ICD-10-CM

## 2016-08-20 MED ORDER — SULFAMETHOXAZOLE-TRIMETHOPRIM 800-160 MG PO TABS: 1.0000 | ORAL_TABLET | Freq: Two times a day (BID) | ORAL | 0 refills | Status: DC

## 2016-08-20 NOTE — Telephone Encounter (Signed)
Pt notified of abnormal urine culture and RX was sent to [facility] in [place].

## 2016-10-14 ENCOUNTER — Ambulatory Visit (INDEPENDENT_AMBULATORY_CARE_PROVIDER_SITE_OTHER): Payer: BLUE CROSS/BLUE SHIELD

## 2016-10-14 ENCOUNTER — Encounter: Payer: Self-pay | Admitting: Sports Medicine

## 2016-10-14 ENCOUNTER — Ambulatory Visit (INDEPENDENT_AMBULATORY_CARE_PROVIDER_SITE_OTHER): Payer: BLUE CROSS/BLUE SHIELD | Admitting: Sports Medicine

## 2016-10-14 DIAGNOSIS — M25562 Pain in left knee: Secondary | ICD-10-CM

## 2016-10-14 DIAGNOSIS — M25561 Pain in right knee: Secondary | ICD-10-CM | POA: Diagnosis not present

## 2016-10-14 DIAGNOSIS — M170 Bilateral primary osteoarthritis of knee: Secondary | ICD-10-CM | POA: Insufficient documentation

## 2016-10-14 NOTE — Progress Notes (Signed)
   Subjective:    I'm seeing this patient as a consultation for:  Dr. [name]  CC: Left knee pain  HPI: This is a pleasant 45 year old female, she comes in with a several week history of increasing pain along the anterolateral aspect of her left knee, worse with sitting, squatting, kneeling. Pain is moderate, persistent, no mechanical symptoms, no trauma.  Past medical history:  Negative.  See flowsheet/record as well for more information.  Surgical history: Negative.  See flowsheet/record as well for more information.  Family history: Negative.  See flowsheet/record as well for more information.  Social history: Negative.  See flowsheet/record as well for more information.  Allergies, and medications have been entered into the medical record, reviewed, and no changes needed.   Review of Systems: No headache, visual changes, nausea, vomiting, diarrhea, constipation, dizziness, abdominal pain, skin rash, fevers, chills, night sweats, weight loss, swollen lymph nodes, body aches, joint swelling, muscle aches, chest pain, shortness of breath, mood changes, visual or auditory hallucinations.   Objective:   General: Well Developed, well nourished, and in no acute distress.  Neuro/Psych: Alert and oriented x3, extra-ocular muscles intact, able to move all 4 extremities, sensation grossly intact. Skin: Warm and dry, no rashes noted.  Respiratory: Not using accessory muscles, speaking in full sentences, trachea midline.  Cardiovascular: Pulses palpable, no extremity edema. Abdomen: Does not appear distended. Left Knee: Normal to inspection with no erythema or effusion or obvious bony abnormalities. Palpation normal with no warmth or joint line tenderness or patellar tenderness or condyle tenderness. ROM normal in flexion and extension and lower leg rotation. Ligaments with solid consistent endpoints including ACL, PCL, LCL, MCL. Negative Mcmurray's and provocative meniscal  tests. Non painful patellar compression. Tender to palpation at the medial and lateral patellar facets Patellar and quadriceps tendons unremarkable. Hamstring and quadriceps strength is normal.  Hip abductor's week on the left side  Impression and Recommendations:   This case required medical decision making of moderate complexity.  Pain of left patellofemoral joint Patellofemoral rehabilitation, x-rays, hip abductor rehabilitation. Return in 6 weeks, injection if strength has improved but still has pain.

## 2016-10-14 NOTE — Patient Instructions (Signed)
Hip Rehabilitation Protocol:  1.  Side leg raises.  3x30 with no weight, then 3x15 with 2 lb ankle weight, then 3x15 with 5 lb ankle weight 2.  Standing hip rotation.  3x30 with no weight, then 3x15 with 2 lb ankle weight, then 3x15 with 5 lb ankle weight. 3.  Side step ups.  3x30 with no weight, then 3x15 with 5 lbs in backpack, then 3x15 with 10 lbs in backpack. 

## 2016-10-14 NOTE — Assessment & Plan Note (Signed)
Patellofemoral rehabilitation, x-rays, hip abductor rehabilitation. Return in 6 weeks, injection if strength has improved but still has pain.

## 2016-10-16 ENCOUNTER — Encounter: Payer: Self-pay | Admitting: Sports Medicine

## 2016-10-16 ENCOUNTER — Ambulatory Visit (INDEPENDENT_AMBULATORY_CARE_PROVIDER_SITE_OTHER): Payer: BLUE CROSS/BLUE SHIELD | Admitting: Sports Medicine

## 2016-10-16 DIAGNOSIS — M25562 Pain in left knee: Secondary | ICD-10-CM | POA: Diagnosis not present

## 2016-10-16 NOTE — Progress Notes (Signed)
  Subjective:    CC: Follow-up  HPI: Left knee pain: I saw this pleasant 45 year old female a few days ago, diagnosed her with patellofemoral chondromalacia, unfortunately she continued to have persistent moderate left knee pain and returns today for interventional treatment. She understands it is a bit early. Pain is moderate, persistent, localized under the kneecap, worse with squatting and going up and down stairs.  Past medical history:  Negative.  See flowsheet/record as well for more information.  Surgical history: Negative.  See flowsheet/record as well for more information.  Family history: Negative.  See flowsheet/record as well for more information.  Social history: Negative.  See flowsheet/record as well for more information.  Allergies, and medications have been entered into the medical record, reviewed, and no changes needed.   Review of Systems: No fevers, chills, night sweats, weight loss, chest pain, or shortness of breath.   Objective:    General: Well Developed, well nourished, and in no acute distress.  Neuro: Alert and oriented x3, extra-ocular muscles intact, sensation grossly intact.  HEENT: Normocephalic, atraumatic, pupils equal round reactive to light, neck supple, no masses, no lymphadenopathy, thyroid nonpalpable.  Skin: Warm and dry, no rashes. Cardiac: Regular rate and rhythm, no murmurs rubs or gallops, no lower extremity edema.  Respiratory: Clear to auscultation bilaterally. Not using accessory muscles, speaking in full sentences.  Procedure: Real-time Ultrasound Guided Injection of left knee Device: GE Logiq E  Verbal informed consent obtained.  Time-out conducted.  Noted no overlying erythema, induration, or other signs of local infection.  Skin prepped in a sterile fashion.  Local anesthesia: Topical Ethyl chloride.  With sterile technique and under real time ultrasound guidance: 1 mL kenalog 40, 2 mL lidocaine, 2 mL Marcaine injected easily.    Completed without difficulty  Pain immediately resolved suggesting accurate placement of the medication.  Advised to call if fevers/chills, erythema, induration, drainage, or persistent bleeding.  Images permanently stored and available for review in the ultrasound unit.  Impression: Technically successful ultrasound guided injection.  Impression and Recommendations:    Pain of left patellofemoral joint Still needs to do PT, hip abductor rehabilitation, however pain is severe and she desires injection today before we continue. Injection as above, return in one month.

## 2016-10-16 NOTE — Telephone Encounter (Signed)
Spoke with Pt and Provider, added to schedule today.

## 2016-10-16 NOTE — Assessment & Plan Note (Signed)
Still needs to do PT, hip abductor rehabilitation, however pain is severe and she desires injection today before we continue. Injection as above, return in one month.

## 2016-11-02 DIAGNOSIS — F3110 Bipolar disorder, current episode manic without psychotic features, unspecified: Secondary | ICD-10-CM | POA: Diagnosis not present

## 2016-11-12 ENCOUNTER — Encounter: Payer: Self-pay | Admitting: Sports Medicine

## 2016-11-23 ENCOUNTER — Encounter: Payer: Self-pay | Admitting: Sports Medicine

## 2016-11-25 ENCOUNTER — Ambulatory Visit: Payer: BLUE CROSS/BLUE SHIELD | Admitting: Sports Medicine

## 2016-12-02 ENCOUNTER — Encounter: Payer: Self-pay | Admitting: Sports Medicine

## 2017-01-05 ENCOUNTER — Telehealth: Payer: Self-pay | Admitting: Sports Medicine

## 2017-01-05 NOTE — Telephone Encounter (Signed)
Submitted for approval on Lt knee Orthovisc. Awaiting confirmation.

## 2017-01-12 MED ORDER — SODIUM HYALURONATE (VISCOSUP) 20 MG/2ML IX SOSY: 1.0000 | PREFILLED_SYRINGE | INTRA_ARTICULAR | 0 refills | Status: DC

## 2017-01-12 NOTE — Telephone Encounter (Signed)
Done

## 2017-01-12 NOTE — Addendum Note (Signed)
Addended by: Silverio Decamp on: 01/12/2017 12:44 PM   Modules accepted: Orders

## 2017-01-12 NOTE — Telephone Encounter (Signed)
Received the following information from OV benefits investigation:   Patient has PPO plan with an effective date of 12/28/2016. Orthovisc is not the preferred drug through [facility] and will not be covered until patient has tried and failed with the preferred drug. pre-cert would be required after trying and failing with preferred, pre-cert phone # is [phone]. [id number] is covered at 80% & [id number] is covered at 80% of the contracted rate when performed an office setting. Office visits are covered at 80% of the contracted rate. Deductible must be met for coverage to apply. If out of pocket is met, coverage goes to 100%. REF: [id number]   Called [employer], spoke with Cyndia Bent. Was advised that Euflexxa is the preferred drug. Will route to Provider for order. [facility].   Notified Pt of current order status.

## 2017-01-27 ENCOUNTER — Encounter: Payer: Self-pay | Admitting: Sports Medicine

## 2017-01-27 NOTE — Telephone Encounter (Signed)
Called speciality pharmacy, provided diagnosis code for injection. Claim is processing.

## 2017-02-01 DIAGNOSIS — F3110 Bipolar disorder, current episode manic without psychotic features, unspecified: Secondary | ICD-10-CM | POA: Diagnosis not present

## 2017-02-11 ENCOUNTER — Encounter: Payer: Self-pay | Admitting: Sports Medicine

## 2017-02-12 MED ORDER — SODIUM HYALURONATE (VISCOSUP) 20 MG/2ML IX SOSY: PREFILLED_SYRINGE | INTRA_ARTICULAR | 0 refills | Status: DC

## 2017-02-12 NOTE — Telephone Encounter (Signed)
I just spoke with the speciality pharmacy regarding your knee injections. They had incorrect information regarding insurance, I have corrected this problem.

## 2017-02-12 NOTE — Telephone Encounter (Signed)
Done

## 2017-02-16 ENCOUNTER — Encounter: Payer: Self-pay | Admitting: Sports Medicine

## 2017-02-25 ENCOUNTER — Encounter: Payer: Self-pay | Admitting: Sports Medicine

## 2017-02-25 NOTE — Telephone Encounter (Signed)
Reubin Milan will you look into this for me, she has messaged [place] multiple times regarding her Euflexxa injections for both knees.

## 2017-02-25 NOTE — Telephone Encounter (Signed)
Called pharmacy, spoke with Sheran Lawless. The Rx was stuck in insurance benefit review. Supervisor is pulling the case, should have determination by end of business today.   Left this update on Pt's VM.

## 2017-02-25 NOTE — Telephone Encounter (Signed)
Left update on Pt's VM.  

## 2017-02-26 ENCOUNTER — Encounter: Payer: Self-pay | Admitting: Sports Medicine

## 2017-03-02 NOTE — Telephone Encounter (Signed)
insurance requires PA form to be completed. Completed form and faxed back with clinicals.   Pt advised of current status.

## 2017-03-17 NOTE — Telephone Encounter (Signed)
Received call from insurance, euflexxa denied. Will route to Provider. Please sent to [facility].   Pt advised.

## 2017-03-18 ENCOUNTER — Other Ambulatory Visit: Payer: Self-pay | Admitting: Sports Medicine

## 2017-03-18 MED ORDER — SODIUM HYALURONATE (VISCOSUP) 25 MG/2.5ML IX SOSY: PREFILLED_SYRINGE | INTRA_ARTICULAR | 0 refills | Status: DC

## 2017-03-18 NOTE — Telephone Encounter (Signed)
Supartz sent.

## 2017-03-18 NOTE — Addendum Note (Signed)
Addended by: Silverio Decamp on: 03/18/2017 08:50 AM   Modules accepted: Orders

## 2017-03-25 NOTE — Telephone Encounter (Signed)
Completed PA via CoverMyMeds.

## 2017-03-25 NOTE — Telephone Encounter (Signed)
Received authorization. Case id: [id number]. Valid: 03/25/17-03/25/18.  Speciality pharmacy, Cathrine Muster, notified.   Pt notified.

## 2017-03-29 ENCOUNTER — Ambulatory Visit (INDEPENDENT_AMBULATORY_CARE_PROVIDER_SITE_OTHER): Payer: BLUE CROSS/BLUE SHIELD | Admitting: Physician Assistant

## 2017-03-29 ENCOUNTER — Encounter: Payer: Self-pay | Admitting: Physician Assistant

## 2017-03-29 VITALS — BP 132/87 | HR 62 | Ht 66.0 in | Wt 170.0 lb

## 2017-03-29 DIAGNOSIS — R5383 Other fatigue: Secondary | ICD-10-CM | POA: Diagnosis not present

## 2017-03-29 DIAGNOSIS — F3132 Bipolar disorder, current episode depressed, moderate: Secondary | ICD-10-CM

## 2017-03-29 DIAGNOSIS — F40298 Other specified phobia: Secondary | ICD-10-CM | POA: Diagnosis not present

## 2017-03-29 LAB — CBC
HEMATOCRIT: 41.5 % (ref 35.0–45.0)
Hemoglobin: 14 g/dL (ref 11.7–15.5)
MCH: 32.3 pg (ref 27.0–33.0)
MCHC: 33.7 g/dL (ref 32.0–36.0)
MCV: 95.8 fL (ref 80.0–100.0)
MPV: 11 fL (ref 7.5–12.5)
PLATELETS: 226 10*3/uL (ref 140–400)
RBC: 4.33 MIL/uL (ref 3.80–5.10)
RDW: 13.3 % (ref 11.0–15.0)
WBC: 6.7 10*3/uL (ref 3.8–10.8)

## 2017-03-29 LAB — SEDIMENTATION RATE: SED RATE: 4 mm/h (ref 0–20)

## 2017-03-29 LAB — VITAMIN B12

## 2017-03-29 LAB — FERRITIN: Ferritin: 74 ng/mL (ref 10–232)

## 2017-03-29 MED ORDER — BUPROPION HCL ER (XL) 150 MG PO TB24: 150.0000 mg | ORAL_TABLET | Freq: Every day | ORAL | 1 refills | Status: DC

## 2017-03-29 NOTE — Progress Notes (Signed)
   Subjective:    Patient ID: Michaela Dodson, female    DOB: 1971-11-07, 46 y.o.   MRN: [id number]  HPI  Pt is a 46 yo female who presents to the clinic with increasingly no energy or motivation do no anything. She has bipolar 1 with depression and about 2 months ago stopped all of her medications. She had found an un dissolved pill in stool and felt like she was not absorbing medication. Her psychiatrist told her to crush tablet and take anyway she did do that a few times but felt she was more irritable. She is sleeping from 7pm to 7am. She has no desire to go to the gym. She had gastric sleeve and feels like she is eating and drinking more to make  Herself "feel better".    Review of Systems See HPI.     Objective:   Physical Exam  Constitutional: She is oriented to person, place, and time. She appears well-developed and well-nourished.  HENT:  Head: Normocephalic and atraumatic.  Cardiovascular: Normal rate, regular rhythm and normal heart sounds.   Pulmonary/Chest: Effort normal and breath sounds normal.  Neurological: She is alert and oriented to person, place, and time.  Psychiatric: She has a normal mood and affect. Her behavior is normal.          Assessment & Plan:  Marland KitchenMarland KitchenDiagnoses and all orders for this visit:  Bipolar 1 disorder, depressed, moderate (HCC) -     buPROPion (WELLBUTRIN XL) 150 MG 24 hr tablet; Take 1 tablet (150 mg total) by mouth daily.  No energy -     CBC -     Comprehensive metabolic panel -     C-reactive protein -     Ferritin -     Sedimentation rate -     TSH -     Vitamin B12 -     Vit D  25 hydroxy (rtn osteoporosis monitoring)    Depression screen [facility] 2/9 03/29/2017  Decreased Interest 3  Down, Depressed, Hopeless 3  PHQ - 2 Score 6  Altered sleeping 3  Tired, decreased energy 3  Change in appetite 3  Feeling bad or failure about yourself  2  Trouble concentrating 3  Moving slowly or fidgety/restless 3  Suicidal thoughts 0  PHQ-9  Score 23   Certainly feel like symptoms represent uncontrolled bipolar due to not being on medication. Restarted wellbutrin since energy is primary concern. Suggested close follow up with pysch and discussion about any mal absorption issues.  Will order fatigue panel to make sure nothing else is going on or would help with energy.

## 2017-03-30 LAB — COMPREHENSIVE METABOLIC PANEL
ALK PHOS: 49 U/L (ref 33–115)
ALT: 15 U/L (ref 6–29)
AST: 13 U/L (ref 10–35)
Albumin: 4 g/dL (ref 3.6–5.1)
BUN: 12 mg/dL (ref 7–25)
CALCIUM: 9 mg/dL (ref 8.6–10.2)
CHLORIDE: 105 mmol/L (ref 98–110)
CO2: 27 mmol/L (ref 20–31)
Creat: 0.59 mg/dL (ref 0.50–1.10)
GLUCOSE: 97 mg/dL (ref 65–99)
POTASSIUM: 4.2 mmol/L (ref 3.5–5.3)
Sodium: 141 mmol/L (ref 135–146)
Total Bilirubin: 2.8 mg/dL — ABNORMAL HIGH (ref 0.2–1.2)
Total Protein: 6.3 g/dL (ref 6.1–8.1)

## 2017-03-30 LAB — C-REACTIVE PROTEIN: CRP: 0.5 mg/L (ref <8.0)

## 2017-03-30 LAB — TSH: TSH: 1.37 m[IU]/L

## 2017-03-30 LAB — VITAMIN D 25 HYDROXY (VIT D DEFICIENCY, FRACTURES): Vit D, 25-Hydroxy: 39 ng/mL (ref 30–100)

## 2017-03-30 NOTE — Progress Notes (Signed)
Call pt: CRP is normal. Thyroid looks great. Vitamin D looks good.  No anemic.  B12 is way too high by almost 1000 points. What ever b12 you are on need to cut in half and recheck in 2 months.  No metabolic reason for fatigue.

## 2017-04-05 DIAGNOSIS — F40298 Other specified phobia: Secondary | ICD-10-CM | POA: Diagnosis not present

## 2017-04-10 ENCOUNTER — Encounter: Payer: Self-pay | Admitting: Sports Medicine

## 2017-04-13 DIAGNOSIS — M1712 Unilateral primary osteoarthritis, left knee: Secondary | ICD-10-CM | POA: Diagnosis not present

## 2017-04-13 NOTE — Telephone Encounter (Signed)
Called pharmacy, verified shipping address. Injections should arrive in office tomorrow. Pt advised.

## 2017-04-14 NOTE — Telephone Encounter (Signed)
Received Supartz for left knee. Called pharmacy and placed order for right knee. Pt advised and scheduled.

## 2017-04-27 ENCOUNTER — Encounter: Payer: Self-pay | Admitting: Sports Medicine

## 2017-04-27 ENCOUNTER — Ambulatory Visit (INDEPENDENT_AMBULATORY_CARE_PROVIDER_SITE_OTHER): Payer: BLUE CROSS/BLUE SHIELD | Admitting: Sports Medicine

## 2017-04-27 ENCOUNTER — Telehealth: Payer: Self-pay | Admitting: Sports Medicine

## 2017-04-27 DIAGNOSIS — M170 Bilateral primary osteoarthritis of knee: Secondary | ICD-10-CM

## 2017-04-27 DIAGNOSIS — E882 Lipomatosis, not elsewhere classified: Secondary | ICD-10-CM | POA: Insufficient documentation

## 2017-04-27 NOTE — Telephone Encounter (Signed)
Fine! I abhor it when you are right.

## 2017-04-27 NOTE — Progress Notes (Signed)
  Procedure: Real-time Ultrasound Guided Injection of left knee Device: GE Logiq E  Verbal informed consent obtained.  Time-out conducted.  Noted no overlying erythema, induration, or other signs of local infection.  Skin prepped in a sterile fashion.  Local anesthesia: Topical Ethyl chloride.  With sterile technique and under real time ultrasound guidance:   25 mg/2.5 mL of Supartz (sodium hyaluronate) in a prefilled syringe was injected easily into the knee through a 22-gauge needle. Completed without difficulty  Pain immediately resolved suggesting accurate placement of the medication.  Advised to call if fevers/chills, erythema, induration, drainage, or persistent bleeding.  Images permanently stored and available for review in the ultrasound unit.  Impression: Technically successful ultrasound guided injection.  Procedure: Real-time Ultrasound Guided Injection of right knee Device: GE Logiq E  Verbal informed consent obtained.  Time-out conducted.  Noted no overlying erythema, induration, or other signs of local infection.  Skin prepped in a sterile fashion.  Local anesthesia: Topical Ethyl chloride.  With sterile technique and under real time ultrasound guidance:  1 mL Kenalog 40, 2 mL lidocaine, 2 mL bupivacaine injected easily. Completed without difficulty  Pain immediately resolved suggesting accurate placement of the medication.  Advised to call if fevers/chills, erythema, induration, drainage, or persistent bleeding.  Images permanently stored and available for review in the ultrasound unit.  Impression: Technically successful ultrasound guided injection.

## 2017-04-27 NOTE — Assessment & Plan Note (Signed)
Patient describes several areas of lipomas over the skin, I have asked her to discuss this with her PCP.

## 2017-04-27 NOTE — Telephone Encounter (Signed)
Already submitted, please see OV phone note from  01/05/17.

## 2017-04-27 NOTE — Assessment & Plan Note (Addendum)
Supartz injection #1 of 5 into the left knee, return in one week for #2. We also injected steroid into the right knee, I am going to work on getting her approved for Supartz for the right knee as well.

## 2017-04-27 NOTE — Telephone Encounter (Signed)
-----   Message from Silverio Decamp, MD sent at 04/27/2017 10:31 AM EDT ----- Jeanmarie Hubert would you get her approved for supartz in the right knee as well?  It took months but we got it approved for the left and started today.  Thank you ___________________________________________ Gwen Her. Dianah Field, M.D., ABFM., CAQSM. Primary Care and [place] [employer] [place] [employer] [facility] [employer]

## 2017-04-28 ENCOUNTER — Ambulatory Visit (INDEPENDENT_AMBULATORY_CARE_PROVIDER_SITE_OTHER): Payer: BLUE CROSS/BLUE SHIELD | Admitting: Family Medicine

## 2017-04-28 ENCOUNTER — Encounter: Payer: Self-pay | Admitting: Family Medicine

## 2017-04-28 VITALS — BP 106/56 | HR 83 | Ht 66.0 in | Wt 164.0 lb

## 2017-04-28 DIAGNOSIS — D1720 Benign lipomatous neoplasm of skin and subcutaneous tissue of unspecified limb: Secondary | ICD-10-CM

## 2017-04-28 NOTE — Progress Notes (Signed)
   Subjective:    Patient ID: Michaela Dodson, female    DOB: 12/26/1971, 46 y.o.   MRN: [id number]  HPI She comes in because she has multiple lipomas particularly on her upper thighs. They've been there for years but since she had her weight loss surgery and has lost a fair amount of weight they're much more noticeable. She has a crease in a month and wants to have them cosmetically treated. She's not interested in having them surgically removed and wanted to know if there could be a procedure such as injections that would help. She has about 10 of them.   Review of Systems     Objective:   Physical Exam  Constitutional: She appears well-developed.  Skin:  She has several fairly small lipomas most less than a centimeter on both upper thighs.  Psychiatric: She has a normal mood and affect.          Assessment & Plan:  Lipoma, upper thighs bilaterally-discussed that the main treatment regimen is actually excision. We could consider treating with a steroid injection to cause atrophy but sometimes that can actually cause dimpling and even hypopigmentation of the skin which cosmetically would not look good. I will check to see if there may be some type of cryotherapy option that could be helpful.

## 2017-05-03 ENCOUNTER — Encounter: Payer: Self-pay | Admitting: Sports Medicine

## 2017-05-03 ENCOUNTER — Telehealth: Payer: Self-pay | Admitting: Family Medicine

## 2017-05-03 NOTE — Telephone Encounter (Signed)
Pt informed of recommendations.Michaela Dodson [place]  

## 2017-05-03 NOTE — Telephone Encounter (Signed)
Please call patient with her know that I did talk with Dr. Helene Kelp [place]'s office. She has a spot that does laser treatment therapy here in [place]. They actually do do treatments for lipomas. They have a special laser call Sculpsure.  And sometimes they will even do a treatment with a special injection called Kybella.  These treatments are cosmetic but I would encourage you to call their office and maybe do a consultation.

## 2017-05-04 ENCOUNTER — Ambulatory Visit (INDEPENDENT_AMBULATORY_CARE_PROVIDER_SITE_OTHER): Payer: BLUE CROSS/BLUE SHIELD | Admitting: Sports Medicine

## 2017-05-04 ENCOUNTER — Encounter: Payer: Self-pay | Admitting: Sports Medicine

## 2017-05-04 DIAGNOSIS — M1711 Unilateral primary osteoarthritis, right knee: Secondary | ICD-10-CM | POA: Diagnosis not present

## 2017-05-04 DIAGNOSIS — M170 Bilateral primary osteoarthritis of knee: Secondary | ICD-10-CM

## 2017-05-04 NOTE — Assessment & Plan Note (Signed)
Supartz injection #2 of 5 into the left knee. Supartz injection #1 of 5 into the right knee.

## 2017-05-04 NOTE — Progress Notes (Signed)
  Procedure: Real-time Ultrasound Guided Injection of left knee Device: GE Logiq E  Verbal informed consent obtained.  Time-out conducted.  Noted no overlying erythema, induration, or other signs of local infection.  Skin prepped in a sterile fashion.  Local anesthesia: Topical Ethyl chloride.  With sterile technique and under real time ultrasound guidance:  25 mg/2.5 mL of Supartz (sodium hyaluronate) in a prefilled syringe was injected easily into the knee through a 22-gauge needle. Completed without difficulty  Pain immediately resolved suggesting accurate placement of the medication.  Advised to call if fevers/chills, erythema, induration, drainage, or persistent bleeding.  Images permanently stored and available for review in the ultrasound unit.  Impression: Technically successful ultrasound guided injection.  Procedure: Real-time Ultrasound Guided Injection of right knee Device: GE Logiq E  Verbal informed consent obtained.  Time-out conducted.  Noted no overlying erythema, induration, or other signs of local infection.  Skin prepped in a sterile fashion.  Local anesthesia: Topical Ethyl chloride.  With sterile technique and under real time ultrasound guidance:  25 mg/2.5 mL of Supartz (sodium hyaluronate) in a prefilled syringe was injected easily into the knee through a 22-gauge needle. Completed without difficulty  Pain immediately resolved suggesting accurate placement of the medication.  Advised to call if fevers/chills, erythema, induration, drainage, or persistent bleeding.  Images permanently stored and available for review in the ultrasound unit.  Impression: Technically successful ultrasound guided injection. 

## 2017-05-04 NOTE — Telephone Encounter (Signed)
Called pharmacy, set up shipment. Injections for right knee should arrive 05/06/17.

## 2017-05-05 ENCOUNTER — Encounter: Payer: Self-pay | Admitting: Family Medicine

## 2017-05-11 ENCOUNTER — Encounter: Payer: Self-pay | Admitting: Sports Medicine

## 2017-05-11 ENCOUNTER — Ambulatory Visit (INDEPENDENT_AMBULATORY_CARE_PROVIDER_SITE_OTHER): Payer: BLUE CROSS/BLUE SHIELD | Admitting: Sports Medicine

## 2017-05-11 DIAGNOSIS — M170 Bilateral primary osteoarthritis of knee: Secondary | ICD-10-CM

## 2017-05-11 NOTE — Assessment & Plan Note (Signed)
Supartz injection #3 of 5 into the left knee. Supartz injection #2 of 5 into the right knee.

## 2017-05-11 NOTE — Progress Notes (Signed)
  Procedure: Real-time Ultrasound Guided Injection of left knee Device: GE Logiq E  Verbal informed consent obtained.  Time-out conducted.  Noted no overlying erythema, induration, or other signs of local infection.  Skin prepped in a sterile fashion.  Local anesthesia: Topical Ethyl chloride.  With sterile technique and under real time ultrasound guidance:  25 mg/2.5 mL of Supartz (sodium hyaluronate) in a prefilled syringe was injected easily into the knee through a 22-gauge needle. Completed without difficulty  Pain immediately resolved suggesting accurate placement of the medication.  Advised to call if fevers/chills, erythema, induration, drainage, or persistent bleeding.  Images permanently stored and available for review in the ultrasound unit.  Impression: Technically successful ultrasound guided injection.  Procedure: Real-time Ultrasound Guided Injection of right knee Device: GE Logiq E  Verbal informed consent obtained.  Time-out conducted.  Noted no overlying erythema, induration, or other signs of local infection.  Skin prepped in a sterile fashion.  Local anesthesia: Topical Ethyl chloride.  With sterile technique and under real time ultrasound guidance:  25 mg/2.5 mL of Supartz (sodium hyaluronate) in a prefilled syringe was injected easily into the knee through a 22-gauge needle. Completed without difficulty  Pain immediately resolved suggesting accurate placement of the medication.  Advised to call if fevers/chills, erythema, induration, drainage, or persistent bleeding.  Images permanently stored and available for review in the ultrasound unit.  Impression: Technically successful ultrasound guided injection. 

## 2017-05-17 ENCOUNTER — Encounter: Payer: Self-pay | Admitting: Family Medicine

## 2017-05-18 ENCOUNTER — Ambulatory Visit (INDEPENDENT_AMBULATORY_CARE_PROVIDER_SITE_OTHER): Payer: BLUE CROSS/BLUE SHIELD | Admitting: Sports Medicine

## 2017-05-18 ENCOUNTER — Encounter: Payer: Self-pay | Admitting: Sports Medicine

## 2017-05-18 DIAGNOSIS — M170 Bilateral primary osteoarthritis of knee: Secondary | ICD-10-CM

## 2017-05-18 NOTE — Progress Notes (Signed)
  Procedure: Real-time Ultrasound Guided Injection of left knee Device: GE Logiq E  Verbal informed consent obtained.  Time-out conducted.  Noted no overlying erythema, induration, or other signs of local infection.  Skin prepped in a sterile fashion.  Local anesthesia: Topical Ethyl chloride.  With sterile technique and under real time ultrasound guidance:  25 mg/2.5 mL of Supartz (sodium hyaluronate) in a prefilled syringe was injected easily into the knee through a 22-gauge needle. Completed without difficulty  Pain immediately resolved suggesting accurate placement of the medication.  Advised to call if fevers/chills, erythema, induration, drainage, or persistent bleeding.  Images permanently stored and available for review in the ultrasound unit.  Impression: Technically successful ultrasound guided injection.  Procedure: Real-time Ultrasound Guided Injection of right knee Device: GE Logiq E  Verbal informed consent obtained.  Time-out conducted.  Noted no overlying erythema, induration, or other signs of local infection.  Skin prepped in a sterile fashion.  Local anesthesia: Topical Ethyl chloride.  With sterile technique and under real time ultrasound guidance:  25 mg/2.5 mL of Supartz (sodium hyaluronate) in a prefilled syringe was injected easily into the knee through a 22-gauge needle. Completed without difficulty  Pain immediately resolved suggesting accurate placement of the medication.  Advised to call if fevers/chills, erythema, induration, drainage, or persistent bleeding.  Images permanently stored and available for review in the ultrasound unit.  Impression: Technically successful ultrasound guided injection. 

## 2017-05-18 NOTE — Assessment & Plan Note (Signed)
Supartz injection #4 of 5 into the left knee. Supartz injection #3 of 5 into the right knee.

## 2017-05-25 ENCOUNTER — Encounter: Payer: Self-pay | Admitting: Sports Medicine

## 2017-05-25 ENCOUNTER — Ambulatory Visit (INDEPENDENT_AMBULATORY_CARE_PROVIDER_SITE_OTHER): Payer: BLUE CROSS/BLUE SHIELD | Admitting: Sports Medicine

## 2017-05-25 DIAGNOSIS — M170 Bilateral primary osteoarthritis of knee: Secondary | ICD-10-CM | POA: Diagnosis not present

## 2017-05-25 NOTE — Progress Notes (Signed)
  Procedure: Real-time Ultrasound Guided Injection of left knee Device: GE Logiq E  Verbal informed consent obtained.  Time-out conducted.  Noted no overlying erythema, induration, or other signs of local infection.  Skin prepped in a sterile fashion.  Local anesthesia: Topical Ethyl chloride.  With sterile technique and under real time ultrasound guidance:  25 mg/2.5 mL of Supartz (sodium hyaluronate) in a prefilled syringe was injected easily into the knee through a 22-gauge needle. Completed without difficulty  Pain immediately resolved suggesting accurate placement of the medication.  Advised to call if fevers/chills, erythema, induration, drainage, or persistent bleeding.  Images permanently stored and available for review in the ultrasound unit.  Impression: Technically successful ultrasound guided injection.  Procedure: Real-time Ultrasound Guided Injection of right knee Device: GE Logiq E  Verbal informed consent obtained.  Time-out conducted.  Noted no overlying erythema, induration, or other signs of local infection.  Skin prepped in a sterile fashion.  Local anesthesia: Topical Ethyl chloride.  With sterile technique and under real time ultrasound guidance:  25 mg/2.5 mL of Supartz (sodium hyaluronate) in a prefilled syringe was injected easily into the knee through a 22-gauge needle. Completed without difficulty  Pain immediately resolved suggesting accurate placement of the medication.  Advised to call if fevers/chills, erythema, induration, drainage, or persistent bleeding.  Images permanently stored and available for review in the ultrasound unit.  Impression: Technically successful ultrasound guided injection. 

## 2017-05-25 NOTE — Assessment & Plan Note (Signed)
Supartz injection #5 of 5 into the left knee and #4 of 5 into the right knee.

## 2017-05-28 ENCOUNTER — Ambulatory Visit (INDEPENDENT_AMBULATORY_CARE_PROVIDER_SITE_OTHER): Payer: BLUE CROSS/BLUE SHIELD | Admitting: Sports Medicine

## 2017-05-28 DIAGNOSIS — M170 Bilateral primary osteoarthritis of knee: Secondary | ICD-10-CM | POA: Diagnosis not present

## 2017-05-28 DIAGNOSIS — R300 Dysuria: Secondary | ICD-10-CM

## 2017-05-28 LAB — POCT URINALYSIS DIPSTICK
Bilirubin, UA: NEGATIVE
Glucose, UA: NEGATIVE
Ketones, UA: NEGATIVE
Leukocytes, UA: NEGATIVE
Nitrite, UA: POSITIVE
Protein, UA: 30
Spec Grav, UA: >=1.03 — AB (ref 1.010–1.025)
Urobilinogen, UA: 0.2 U/dL
pH, UA: 6 (ref 5.0–8.0)

## 2017-05-28 MED ORDER — CEPHALEXIN 500 MG PO CAPS: 500.0000 mg | ORAL_CAPSULE | Freq: Two times a day (BID) | ORAL | 0 refills | Status: DC

## 2017-05-28 MED ORDER — FLUCONAZOLE 150 MG PO TABS: 150.0000 mg | ORAL_TABLET | Freq: Once | ORAL | 0 refills | Status: AC

## 2017-05-28 NOTE — Progress Notes (Signed)
  Subjective:    CC: Multiple issues  HPI: Knee osteoarthritis: Here for her fifth and final Supartz injection into the right knee, left knee is doing well post series of Supartz.  Dysuria: History of UTIs, this feels similar, for several days pain and burning with frequency, no flank pain, no abdominal pain, no constitutional symptoms, moderate, persistent.  Past medical history:  Negative.  See flowsheet/record as well for more information.  Surgical history: Negative.  See flowsheet/record as well for more information.  Family history: Negative.  See flowsheet/record as well for more information.  Social history: Negative.  See flowsheet/record as well for more information.  Allergies, and medications have been entered into the medical record, reviewed, and no changes needed.   Review of Systems: No fevers, chills, night sweats, weight loss, chest pain, or shortness of breath.   Objective:    General: Well Developed, well nourished, and in no acute distress.  Neuro: Alert and oriented x3, extra-ocular muscles intact, sensation grossly intact.  HEENT: Normocephalic, atraumatic, pupils equal round reactive to light, neck supple, no masses, no lymphadenopathy, thyroid nonpalpable.  Skin: Warm and dry, no rashes. Cardiac: Regular rate and rhythm, no murmurs rubs or gallops, no lower extremity edema.  Respiratory: Clear to auscultation bilaterally. Not using accessory muscles, speaking in full sentences. Abdomen: Soft, nontender, nondistended, bowel sounds, no palpable masses, guarding, rigidity, rebound pain.  Procedure: Real-time Ultrasound Guided Injection of right knee Device: GE Logiq E  Verbal informed consent obtained.  Time-out conducted.  Noted no overlying erythema, induration, or other signs of local infection.  Skin prepped in a sterile fashion.  Local anesthesia: Topical Ethyl chloride.  With sterile technique and under real time ultrasound guidance:   25 mg/2.5 mL of  Supartz (sodium hyaluronate) in a prefilled syringe was injected easily into the knee through a 22-gauge needle. Completed without difficulty  Pain immediately resolved suggesting accurate placement of the medication.  Advised to call if fevers/chills, erythema, induration, drainage, or persistent bleeding.  Images permanently stored and available for review in the ultrasound unit.  Impression: Technically successful ultrasound guided injection.  Urinalysis is positive for blood and nitrites. Sent for culture.  Impression and Recommendations:    Primary osteoarthritis of both knees Finished Supartz in the left knee, Supartz No. 5 of 5 into the right knee today.  Dysuria Urinalysis, urine culture, cruise coming up.  Nitrites and hematuria, adding Keflex, adding Diflucan for patient to use just in case she gets a yeast infection on the cruise ship. Previous urine cultures have grown pansensitive Escherichia coli.

## 2017-05-28 NOTE — Assessment & Plan Note (Addendum)
Urinalysis, urine culture, cruise coming up.  Nitrites and hematuria, adding Keflex, adding Diflucan for patient to use just in case she gets a yeast infection on the cruise ship. Previous urine cultures have grown pansensitive Escherichia coli.

## 2017-05-28 NOTE — Assessment & Plan Note (Signed)
Finished Supartz in the left knee, Supartz No. 5 of 5 into the right knee today.

## 2017-05-31 LAB — URINE CULTURE

## 2017-06-14 ENCOUNTER — Encounter: Payer: Self-pay | Admitting: Family Medicine

## 2017-06-21 DIAGNOSIS — E559 Vitamin D deficiency, unspecified: Secondary | ICD-10-CM | POA: Diagnosis not present

## 2017-06-21 DIAGNOSIS — Z903 Acquired absence of stomach [part of]: Secondary | ICD-10-CM | POA: Diagnosis not present

## 2017-06-21 DIAGNOSIS — K912 Postsurgical malabsorption, not elsewhere classified: Secondary | ICD-10-CM | POA: Diagnosis not present

## 2017-06-28 ENCOUNTER — Encounter: Payer: Self-pay | Admitting: Family Medicine

## 2017-06-29 DIAGNOSIS — F3110 Bipolar disorder, current episode manic without psychotic features, unspecified: Secondary | ICD-10-CM | POA: Diagnosis not present

## 2017-07-04 ENCOUNTER — Encounter: Payer: Self-pay | Admitting: Sports Medicine

## 2017-07-20 ENCOUNTER — Encounter: Payer: Self-pay | Admitting: Sports Medicine

## 2017-07-20 ENCOUNTER — Ambulatory Visit (INDEPENDENT_AMBULATORY_CARE_PROVIDER_SITE_OTHER): Payer: BLUE CROSS/BLUE SHIELD | Admitting: Sports Medicine

## 2017-07-20 DIAGNOSIS — M5136 Other intervertebral disc degeneration, lumbar region: Secondary | ICD-10-CM

## 2017-07-20 DIAGNOSIS — M51369 Other intervertebral disc degeneration, lumbar region without mention of lumbar back pain or lower extremity pain: Secondary | ICD-10-CM

## 2017-07-20 DIAGNOSIS — R3129 Other microscopic hematuria: Secondary | ICD-10-CM | POA: Diagnosis not present

## 2017-07-20 LAB — POCT URINALYSIS DIPSTICK
Bilirubin, UA: NEGATIVE
Glucose, UA: NEGATIVE
Ketones, UA: NEGATIVE
Leukocytes, UA: NEGATIVE
Nitrite, UA: POSITIVE
Protein, UA: 100
Spec Grav, UA: >=1.03 — AB (ref 1.010–1.025)
Urobilinogen, UA: 0.2 E.U./dL
pH, UA: 5.5 (ref 5.0–8.0)

## 2017-07-20 MED ORDER — LEVOFLOXACIN 750 MG PO TABS: 750.0000 mg | ORAL_TABLET | Freq: Every day | ORAL | 0 refills | Status: DC

## 2017-07-20 NOTE — Assessment & Plan Note (Signed)
With costovertebral angle pain we are going to get a repeat urinalysis and urine culture. This is positive, adding levofloxacin for 14 days.

## 2017-07-20 NOTE — Assessment & Plan Note (Addendum)
Has had several left L3-L4 interlaminar epidurals with only mild to moderate relief. Does have some left costovertebral angle pain today so I am going to do a urinalysis, urine culture, these were positive so adding ciprofloxacin. If insufficient relief we will proceed further with intervention regarding her lumbar spine. We do need a new MRI for further interventional planning. Pain is axial and discogenic on the left in the upper lumbar spine.

## 2017-07-20 NOTE — Progress Notes (Signed)
  Subjective:    CC: Left flank pain  HPI: For the past several weeks this 46 year old female has had pain that she localizes at the left costovertebral angle. We have treated her for lumbar degenerative disc disease in the past with L3-L4 interlaminar epidural injections that worked to some degree. She denies any fevers, chills, dysuria, no changes in her urination but she does get frequent urinary tract infections. Pain is localized directly at the left costovertebral angle.  Worse with sitting, flexion, Valsalva, nothing radicular, no bowel or bladder dysfunction, saddle numbness.  Past medical history:  Negative.  See flowsheet/record as well for more information.  Surgical history: Negative.  See flowsheet/record as well for more information.  Family history: Negative.  See flowsheet/record as well for more information.  Social history: Negative.  See flowsheet/record as well for more information.  Allergies, and medications have been entered into the medical record, reviewed, and no changes needed.   Review of Systems: No fevers, chills, night sweats, weight loss, chest pain, or shortness of breath.   Objective:    General: Well Developed, well nourished, and in no acute distress.  Neuro: Alert and oriented x3, extra-ocular muscles intact, sensation grossly intact.  HEENT: Normocephalic, atraumatic, pupils equal round reactive to light, neck supple, no masses, no lymphadenopathy, thyroid nonpalpable.  Skin: Warm and dry, no rashes. Cardiac: Regular rate and rhythm, no murmurs rubs or gallops, no lower extremity edema.  Respiratory: Clear to auscultation bilaterally. Not using accessory muscles, speaking in full sentences. Back Exam:  Inspection: Unremarkable  Motion: Flexion 45 deg, Extension 45 deg, Side Bending to 45 deg bilaterally,  Rotation to 45 deg bilaterally  SLR laying: Negative  XSLR laying: Negative  Palpable tenderness: Left costovertebral angle. FABER:  negative. Sensory change: Gross sensation intact to all lumbar and sacral dermatomes.  Reflexes: 2+ at both patellar tendons, 2+ at achilles tendons, Babinski's downgoing.  Strength at foot  Plantar-flexion: 5/5 Dorsi-flexion: 5/5 Eversion: 5/5 Inversion: 5/5  Leg strength  Quad: 5/5 Hamstring: 5/5 Hip flexor: 5/5 Hip abductors: 5/5  Gait unremarkable.  Urinalysis positive for blood, nitrites.  Impression and Recommendations:    Lumbar degenerative disc disease Has had several left L3-L4 interlaminar epidurals with only mild to moderate relief. Does have some left costovertebral angle pain today so I am going to do a urinalysis, urine culture, these were positive so adding ciprofloxacin. If insufficient relief we will proceed further with intervention regarding her lumbar spine. We do need a new MRI for further interventional planning. Pain is axial and discogenic on the left in the upper lumbar spine.  Microscopic hematuria With costovertebral angle pain we are going to get a repeat urinalysis and urine culture. This is positive, adding levofloxacin for 14 days.

## 2017-07-23 LAB — URINE CULTURE

## 2017-07-26 ENCOUNTER — Ambulatory Visit (INDEPENDENT_AMBULATORY_CARE_PROVIDER_SITE_OTHER): Payer: BLUE CROSS/BLUE SHIELD

## 2017-07-26 DIAGNOSIS — M5136 Other intervertebral disc degeneration, lumbar region: Secondary | ICD-10-CM

## 2017-07-26 DIAGNOSIS — M545 Low back pain: Secondary | ICD-10-CM | POA: Diagnosis not present

## 2017-07-27 DIAGNOSIS — F3110 Bipolar disorder, current episode manic without psychotic features, unspecified: Secondary | ICD-10-CM | POA: Diagnosis not present

## 2017-07-28 ENCOUNTER — Encounter: Payer: Self-pay | Admitting: Sports Medicine

## 2017-07-28 MED ORDER — PREDNISONE 50 MG PO TABS: 50.0000 mg | ORAL_TABLET | Freq: Every day | ORAL | 0 refills | Status: DC

## 2017-08-15 ENCOUNTER — Encounter: Payer: Self-pay | Admitting: Family Medicine

## 2017-08-16 ENCOUNTER — Ambulatory Visit: Payer: BLUE CROSS/BLUE SHIELD | Admitting: Sports Medicine

## 2017-08-17 ENCOUNTER — Ambulatory Visit (INDEPENDENT_AMBULATORY_CARE_PROVIDER_SITE_OTHER): Payer: BLUE CROSS/BLUE SHIELD | Admitting: Sports Medicine

## 2017-08-17 ENCOUNTER — Encounter: Payer: Self-pay | Admitting: Sports Medicine

## 2017-08-17 DIAGNOSIS — M51369 Other intervertebral disc degeneration, lumbar region without mention of lumbar back pain or lower extremity pain: Secondary | ICD-10-CM

## 2017-08-17 DIAGNOSIS — M5136 Other intervertebral disc degeneration, lumbar region: Secondary | ICD-10-CM | POA: Diagnosis not present

## 2017-08-17 DIAGNOSIS — R3129 Other microscopic hematuria: Secondary | ICD-10-CM | POA: Diagnosis not present

## 2017-08-17 LAB — POCT URINALYSIS DIPSTICK
Bilirubin, UA: NEGATIVE
Glucose, UA: NEGATIVE
Ketones, UA: NEGATIVE
Leukocytes, UA: NEGATIVE
Nitrite, UA: NEGATIVE
Protein, UA: NEGATIVE
Spec Grav, UA: 1.02 (ref 1.010–1.025)
Urobilinogen, UA: 0.2 U/dL
pH, UA: 6.5 (ref 5.0–8.0)

## 2017-08-17 MED ORDER — GABAPENTIN 100 MG PO CAPS: ORAL_CAPSULE | ORAL | 11 refills | Status: DC

## 2017-08-17 NOTE — Assessment & Plan Note (Signed)
Stable L3/4 disc dessication and protrusion. Discogenic, axial back pain. Declines epidural for now. We will restart meloxicam, she has been on gabapentin 300 mg in the past with sedation. I'm going to decrease gabapentin to 100 mg in a slow up taper.  Return to see me in one month, we will consider epidural if insufficient relief. She did have a dural puncture with blood patch last time, so she is understandably apprehensive.

## 2017-08-17 NOTE — Progress Notes (Signed)
  Subjective:    CC: low back pain  HPI: This is a pleasant 46 year old female with known lumbar degenerative disc disease. Recently we've treated her for pyelonephritis with antibiotics, her costovertebral angle pain has resolved. Unfortunately she continues to have axial back pain worse with sitting, flexion, Valsalva without a radicular component. We did obtain a new MRI the results of which will be dictated below. Pain is moderate, persistent.  Past medical history:  Negative.  See flowsheet/record as well for more information.  Surgical history: Negative.  See flowsheet/record as well for more information.  Family history: Negative.  See flowsheet/record as well for more information.  Social history: Negative.  See flowsheet/record as well for more information.  Allergies, and medications have been entered into the medical record, reviewed, and no changes needed.   Review of Systems: No fevers, chills, night sweats, weight loss, chest pain, or shortness of breath.   Objective:    General: Well Developed, well nourished, and in no acute distress.  Neuro: Alert and oriented x3, extra-ocular muscles intact, sensation grossly intact.  HEENT: Normocephalic, atraumatic, pupils equal round reactive to light, neck supple, no masses, no lymphadenopathy, thyroid nonpalpable.  Skin: Warm and dry, no rashes. Cardiac: Regular rate and rhythm, no murmurs rubs or gallops, no lower extremity edema.  Respiratory: Clear to auscultation bilaterally. Not using accessory muscles, speaking in full sentences. Back Exam:  Inspection: Unremarkable  Motion: Flexion 45 deg, Extension 45 deg, Side Bending to 45 deg bilaterally,  Rotation to 45 deg bilaterally  SLR laying: Negative  XSLR laying: Negative  Palpable tenderness: None. FABER: negative. Sensory change: Gross sensation intact to all lumbar and sacral dermatomes.  Reflexes: 2+ at both patellar tendons, 2+ at achilles tendons, Babinski's downgoing.    Strength at foot  Plantar-flexion: 5/5 Dorsi-flexion: 5/5 Eversion: 5/5 Inversion: 5/5  Leg strength  Quad: 5/5 Hamstring: 5/5 Hip flexor: 5/5 Hip abductors: 5/5  Gait unremarkable.  Lumbar spine MRI shows L3-L4 and L4-L5 degenerative disc disease, worse at the L3-L4 level but unchanged from the previous MRI  Urinalysis shows only blood.  Impression and Recommendations:    Lumbar degenerative disc disease Stable L3/4 disc dessication and protrusion. Discogenic, axial back pain. Declines epidural for now. We will restart meloxicam, she has been on gabapentin 300 mg in the past with sedation. I'm going to decrease gabapentin to 100 mg in a slow up taper.  Return to see me in one month, we will consider epidural if insufficient relief. She did have a dural puncture with blood patch last time, so she is understandably apprehensive.  Microscopic hematuria Persistent, MR from 2015 showed small renal cyst that is the likely culprit.

## 2017-08-17 NOTE — Assessment & Plan Note (Signed)
Persistent, MR from 2015 showed small renal cyst that is the likely culprit.

## 2017-08-18 LAB — URINE CULTURE: Organism ID, Bacteria: NO GROWTH

## 2017-08-24 DIAGNOSIS — F3110 Bipolar disorder, current episode manic without psychotic features, unspecified: Secondary | ICD-10-CM | POA: Diagnosis not present

## 2017-09-07 DIAGNOSIS — K912 Postsurgical malabsorption, not elsewhere classified: Secondary | ICD-10-CM | POA: Diagnosis not present

## 2017-09-07 DIAGNOSIS — Z903 Acquired absence of stomach [part of]: Secondary | ICD-10-CM | POA: Diagnosis not present

## 2017-09-14 ENCOUNTER — Ambulatory Visit: Payer: BLUE CROSS/BLUE SHIELD | Admitting: Sports Medicine

## 2017-09-16 ENCOUNTER — Ambulatory Visit (INDEPENDENT_AMBULATORY_CARE_PROVIDER_SITE_OTHER): Payer: BLUE CROSS/BLUE SHIELD | Admitting: Sports Medicine

## 2017-09-16 ENCOUNTER — Encounter: Payer: Self-pay | Admitting: Sports Medicine

## 2017-09-16 DIAGNOSIS — M170 Bilateral primary osteoarthritis of knee: Secondary | ICD-10-CM | POA: Diagnosis not present

## 2017-09-16 DIAGNOSIS — Z903 Acquired absence of stomach [part of]: Secondary | ICD-10-CM | POA: Insufficient documentation

## 2017-09-16 DIAGNOSIS — F329 Major depressive disorder, single episode, unspecified: Secondary | ICD-10-CM | POA: Diagnosis not present

## 2017-09-16 DIAGNOSIS — K219 Gastro-esophageal reflux disease without esophagitis: Secondary | ICD-10-CM | POA: Diagnosis not present

## 2017-09-16 DIAGNOSIS — M5136 Other intervertebral disc degeneration, lumbar region: Secondary | ICD-10-CM | POA: Diagnosis not present

## 2017-09-16 DIAGNOSIS — Z87891 Personal history of nicotine dependence: Secondary | ICD-10-CM | POA: Diagnosis not present

## 2017-09-16 DIAGNOSIS — G479 Sleep disorder, unspecified: Secondary | ICD-10-CM | POA: Diagnosis not present

## 2017-09-16 DIAGNOSIS — K912 Postsurgical malabsorption, not elsewhere classified: Secondary | ICD-10-CM | POA: Insufficient documentation

## 2017-09-16 DIAGNOSIS — M51369 Other intervertebral disc degeneration, lumbar region without mention of lumbar back pain or lower extremity pain: Secondary | ICD-10-CM

## 2017-09-16 NOTE — Assessment & Plan Note (Signed)
Pain is axial, and does sound very discogenic. We are avoiding epidurals for now, she did have a dural puncture needing a blood patch last time. Previous epidurals have also only given her about 1 month of relief. She does have some bilateral L3-L4 facet arthritis that I would like to target. I would also like to get a second opinion from Dr. Lynann Bologna. Return to see me one month afterwards.

## 2017-09-16 NOTE — Assessment & Plan Note (Addendum)
Elevated total bilirubin at [facility], rechecking these levels, and she needs to follow up with her PCP for this. It was elevated to 2.3, rechecking bilirubin levels but this time fractionated.  Bilirubin is elevated, mostly elevated indirect bilirubin consistent with glucuronidation deficiency, likely Gilbert's syndrome. Simply needs monitoring.

## 2017-09-16 NOTE — Assessment & Plan Note (Signed)
Fantastic response to Supartz injections, we finished back in June of this year. She is running more and has a bit of pain at the medial joint line of her right knee, mild effusion. Adding a reaction knee brace, and she will ice her knees after runs. Not bad enough to consider any further intervention.

## 2017-09-16 NOTE — Progress Notes (Addendum)
Subjective:    CC: Multiple issues  HPI: Low back pain: Axial, worse with sitting, flexion, Valsalva, no radicular component, she does have some facet arthritis on MRI, she has had an epidural in the past that provided only a month of relief, also had a dural puncture necessitating durable blood patching. Having persistence of pain, in spite of physical therapy. She is doing some more running and is far more active than prior.  Knee osteoarthritis: Did extremely well with viscosupplementation that we finished about 4-5 months ago, she has been running more and is getting pain at the medial joint line of her right knee, no mechanical symptoms. She still better than before we did the injections.  Hyperbilirubinemia: Noted on blood work at her general surgeon's office, denies any abdominal pain, would like me to look into this.  Past medical history:  Negative.  See flowsheet/record as well for more information.  Surgical history: Negative.  See flowsheet/record as well for more information.  Family history: Negative.  See flowsheet/record as well for more information.  Social history: Negative.  See flowsheet/record as well for more information.  Allergies, and medications have been entered into the medical record, reviewed, and no changes needed.   Review of Systems: No fevers, chills, night sweats, weight loss, chest pain, or shortness of breath.   Objective:    General: Well Developed, well nourished, and in no acute distress.  Neuro: Alert and oriented x3, extra-ocular muscles intact, sensation grossly intact.  HEENT: Normocephalic, atraumatic, pupils equal round reactive to light, neck supple, no masses, no lymphadenopathy, thyroid nonpalpable.  Skin: Warm and dry, no rashes. Cardiac: Regular rate and rhythm, no murmurs rubs or gallops, no lower extremity edema.  Respiratory: Clear to auscultation bilaterally. Not using accessory muscles, speaking in full sentences. Right Knee: Only  mild tenderness the medial joint line, no pain with terminal flexion, mild effusion. ROM normal in flexion and extension and lower leg rotation. Ligaments with solid consistent endpoints including ACL, PCL, LCL, MCL. Negative Mcmurray's and provocative meniscal tests. Non painful patellar compression. Patellar and quadriceps tendons unremarkable. Hamstring and quadriceps strength is normal.  Lumbar spine MRI shows moderate desiccation and protrusion at the L3-L4 level, and less so at the L4-L5 level, there is bilateral L3-L4 facet arthritis  Impression and Recommendations:    Lumbar degenerative disc disease Pain is axial, and does sound very discogenic. We are avoiding epidurals for now, she did have a dural puncture needing a blood patch last time. Previous epidurals have also only given her about 1 month of relief. She does have some bilateral L3-L4 facet arthritis that I would like to target. I would also like to get a second opinion from Dr. Lynann Bologna. Return to see me one month afterwards.  Primary osteoarthritis of both knees Fantastic response to Supartz injections, we finished back in June of this year. She is running more and has a bit of pain at the medial joint line of her right knee, mild effusion. Adding a reaction knee brace, and she will ice her knees after runs. Not bad enough to consider any further intervention.  Hyperbilirubinemia, suspect Gilbert Syndrome Elevated total bilirubin at [facility], rechecking these levels, and she needs to follow up with her PCP for this. It was elevated to 2.3, rechecking bilirubin levels but this time fractionated.  Bilirubin is elevated, mostly elevated indirect bilirubin consistent with glucuronidation deficiency, likely Gilbert's syndrome. Simply needs monitoring.  I spent 40 minutes with this patient, greater than 50% was  face-to-face time counseling regarding the above diagnoses ___________________________________________ Gwen Her. Dianah Field, M.D., ABFM., CAQSM. Primary Care and [place] In[employer] of [place] [facility]

## 2017-09-17 LAB — BILIRUBIN, FRACTIONATED(TOT/DIR/INDIR)
Bilirubin, Direct: 0.5 mg/dL — ABNORMAL HIGH (ref 0.0–0.2)
Indirect Bilirubin: 2.4 mg/dL — ABNORMAL HIGH (ref 0.2–1.2)
Total Bilirubin: 2.9 mg/dL — ABNORMAL HIGH (ref 0.2–1.2)

## 2017-09-27 ENCOUNTER — Encounter: Payer: Self-pay | Admitting: Sports Medicine

## 2017-09-27 DIAGNOSIS — M4696 Unspecified inflammatory spondylopathy, lumbar region: Secondary | ICD-10-CM | POA: Diagnosis not present

## 2017-09-27 NOTE — Telephone Encounter (Signed)
Called Pt, she was at an OV with Ortho for eval on her back. She states she "threw it out on Saturday" and took her husbands prednisone because she "couldn't move." I advised Pt to see what the treatment plan was going to be with Ortho, and if she still needed [place] to do something then to return clinic call. Verbalized understanding.

## 2017-09-29 ENCOUNTER — Encounter: Payer: Self-pay | Admitting: Sports Medicine

## 2017-10-04 DIAGNOSIS — M47816 Spondylosis without myelopathy or radiculopathy, lumbar region: Secondary | ICD-10-CM | POA: Diagnosis not present

## 2017-10-26 DIAGNOSIS — M47816 Spondylosis without myelopathy or radiculopathy, lumbar region: Secondary | ICD-10-CM | POA: Diagnosis not present

## 2017-11-04 ENCOUNTER — Encounter: Payer: Self-pay | Admitting: Family Medicine

## 2017-11-04 ENCOUNTER — Ambulatory Visit (INDEPENDENT_AMBULATORY_CARE_PROVIDER_SITE_OTHER): Payer: BLUE CROSS/BLUE SHIELD | Admitting: Family Medicine

## 2017-11-04 VITALS — BP 111/75 | HR 82 | Temp 98.2°F | Wt 173.0 lb

## 2017-11-04 DIAGNOSIS — R300 Dysuria: Secondary | ICD-10-CM | POA: Diagnosis not present

## 2017-11-04 DIAGNOSIS — N3001 Acute cystitis with hematuria: Secondary | ICD-10-CM

## 2017-11-04 DIAGNOSIS — N390 Urinary tract infection, site not specified: Secondary | ICD-10-CM | POA: Diagnosis not present

## 2017-11-04 LAB — POCT URINALYSIS DIPSTICK
BILIRUBIN UA: NEGATIVE
Glucose, UA: NEGATIVE
KETONES UA: NEGATIVE
Leukocytes, UA: NEGATIVE
NITRITE UA: NEGATIVE
PH UA: 6.5 (ref 5.0–8.0)
Protein, UA: NEGATIVE
SPEC GRAV UA: 1.025 (ref 1.010–1.025)
Urobilinogen, UA: 0.2 E.U./dL

## 2017-11-04 MED ORDER — TRIMETHOPRIM 100 MG PO TABS: 100.0000 mg | ORAL_TABLET | Freq: Every day | ORAL | 3 refills | Status: DC

## 2017-11-04 MED ORDER — SULFAMETHOXAZOLE-TRIMETHOPRIM 800-160 MG PO TABS: 1.0000 | ORAL_TABLET | Freq: Two times a day (BID) | ORAL | 0 refills | Status: DC

## 2017-11-04 NOTE — Progress Notes (Signed)
Subjective:    Patient ID: Michaela Dodson, female    DOB: 1971/11/16, 46 y.o.   MRN: [id number]  HPI 46 year old female with a history of recurrent urinary tract infections comes in today with 4 days of urinary urgency.  She says she does not really have dysuria but most the time when she gets a UTI she just gets urgency.  We will have to use the restroom every hour.  She denies any fever.  She has chronic back pain but nothing new or worse.  She denies any vaginal discharge.   Review of Systems  BP 111/75   Pulse 82   Temp 98.2 F (36.8 C) (Oral)   Wt 173 lb (78.5 kg)   BMI 27.92 kg/m     No Known Allergies  Past Medical History:  Diagnosis Date  . Allergy   . Depression   . GERD (gastroesophageal reflux disease)    Pt states that this has gone away since her recent weight loss  . H/O thromboembolism    superficial-left medial thigh lipoma, vericose veins  . Obesity    Pt has lost the weight and is no longer obese    Past Surgical History:  Procedure Laterality Date  . BREAST BIOPSY    . CARPAL TUNNEL RELEASE Right 09/2012   Dr. Apolonio Schneiders  . CARPAL TUNNEL RELEASE Left 11/2013   Dr. Apolonio Schneiders  . TUBAL LIGATION    . uterine ablation      Social History   Socioeconomic History  . Marital status: Married    Spouse name: Not on file  . Number of children: Not on file  . Years of education: Not on file  . Highest education level: Not on file  Social Needs  . Financial resource strain: Not on file  . Food insecurity - worry: Not on file  . Food insecurity - inability: Not on file  . Transportation needs - medical: Not on file  . Transportation needs - non-medical: Not on file  Occupational History  . Not on file  Tobacco Use  . Smoking status: Former Smoker    Types: Cigarettes    Last attempt to quit: 07/18/2015    Years since quitting: 2.3  . Smokeless tobacco: Never Used  . Tobacco comment: e-cigs  Substance and Sexual Activity  . Alcohol use: Yes  . Drug  use: No  . Sexual activity: Yes    Birth control/protection: None  Other Topics Concern  . Not on file  Social History Narrative  . Not on file    Family History  Problem Relation Age of Onset  . Breast cancer Mother   . Bladder Cancer Mother   . Colon cancer Maternal Grandmother     Outpatient Encounter Medications as of 11/04/2017  Medication Sig  . Cyanocobalamin (B-12 PO) Take by mouth.  . diclofenac (VOLTAREN) 75 MG EC tablet TAKE 1 TABLET BY MOUTH TWICE A DAY AFTER MEALS FOR INFLAMMATION/PAIN/SWELLING  . LAMICTAL 150 MG tablet Take 150 mg by mouth daily.  . metaxalone (SKELAXIN) 800 MG tablet TAKE ONE TAB BY MOUHT TWICE DAILY AS NEEDED  . Multiple Vitamins-Calcium (ONE-A-DAY WOMENS PO) Take 1 capsule by mouth.  . Probiotic Product (PROBIOTIC DAILY PO) Take by mouth.  Marland Kitchen RISPERDAL 0.5 MG tablet   . gabapentin (NEURONTIN) 100 MG capsule 1 cap qHS as needed for a week, then BID for a week, then TID.  Marland Kitchen sulfamethoxazole-trimethoprim (BACTRIM DS,SEPTRA DS) 800-160 MG tablet Take 1 tablet 2 (two)  times daily by mouth.  . trimethoprim (TRIMPEX) 100 MG tablet Take 1 tablet (100 mg total) daily by mouth.   No facility-administered encounter medications on file as of 11/04/2017.          Objective:   Physical Exam  Constitutional: She is oriented to person, place, and time. She appears well-developed and well-nourished.  HENT:  Head: Normocephalic and atraumatic.  Eyes: Conjunctivae and EOM are normal.  Cardiovascular: Normal rate.  Pulmonary/Chest: Effort normal.  Musculoskeletal:  No back/CVA tenderness   Neurological: She is alert and oriented to person, place, and time.  Skin: Skin is dry. No pallor.  Psychiatric: She has a normal mood and affect. Her behavior is normal.  Vitals reviewed.         Assessment & Plan:  UTI - will tx with Bactrim DS x 3 days.  Increase water intake and call if not improving.  For recurrent urinary tract infections-we had a  discussion today about prophylaxis.  She is actually never been on prophylaxis.  She typically gets at least 3 UTIs per year.  We discussed different types of prophylaxis including daily low-dose versus treatment after intercourse versus treating for acute episodes.  We will start by treating with low-dose trimethoprim daily.  She does need a medication that does not have a coating on it I am not sure about this we may have to call the pharmacy to verify.

## 2017-11-05 LAB — URINE CULTURE
MICRO NUMBER:: 81259001
Result:: NO GROWTH
SPECIMEN QUALITY:: ADEQUATE

## 2017-11-08 ENCOUNTER — Telehealth: Payer: Self-pay | Admitting: Family Medicine

## 2017-11-08 DIAGNOSIS — M545 Low back pain: Secondary | ICD-10-CM | POA: Diagnosis not present

## 2017-11-08 NOTE — Telephone Encounter (Signed)
Pt is aware of her urine lab results

## 2017-11-16 DIAGNOSIS — M545 Low back pain: Secondary | ICD-10-CM | POA: Diagnosis not present

## 2017-12-10 DIAGNOSIS — M545 Low back pain: Secondary | ICD-10-CM | POA: Diagnosis not present

## 2017-12-15 ENCOUNTER — Telehealth: Payer: BLUE CROSS/BLUE SHIELD | Admitting: Nurse Practitioner

## 2017-12-15 DIAGNOSIS — J0100 Acute maxillary sinusitis, unspecified: Secondary | ICD-10-CM

## 2017-12-15 MED ORDER — FLUTICASONE PROPIONATE 50 MCG/ACT NA SUSP: 2.0000 | Freq: Every day | NASAL | 6 refills | Status: DC

## 2017-12-15 NOTE — Progress Notes (Signed)

## 2018-01-17 DIAGNOSIS — F3110 Bipolar disorder, current episode manic without psychotic features, unspecified: Secondary | ICD-10-CM | POA: Diagnosis not present

## 2018-01-19 ENCOUNTER — Encounter: Payer: Self-pay | Admitting: Family Medicine

## 2018-01-25 ENCOUNTER — Encounter: Payer: Self-pay | Admitting: Family Medicine

## 2018-01-25 ENCOUNTER — Ambulatory Visit (INDEPENDENT_AMBULATORY_CARE_PROVIDER_SITE_OTHER): Payer: BLUE CROSS/BLUE SHIELD | Admitting: Family Medicine

## 2018-01-25 VITALS — BP 119/76 | HR 75 | Ht 66.0 in | Wt 176.0 lb

## 2018-01-25 DIAGNOSIS — R41840 Attention and concentration deficit: Secondary | ICD-10-CM

## 2018-01-25 DIAGNOSIS — R635 Abnormal weight gain: Secondary | ICD-10-CM

## 2018-01-25 DIAGNOSIS — Z9189 Other specified personal risk factors, not elsewhere classified: Secondary | ICD-10-CM | POA: Diagnosis not present

## 2018-01-25 DIAGNOSIS — F3132 Bipolar disorder, current episode depressed, moderate: Secondary | ICD-10-CM | POA: Diagnosis not present

## 2018-01-25 DIAGNOSIS — L299 Pruritus, unspecified: Secondary | ICD-10-CM | POA: Diagnosis not present

## 2018-01-25 DIAGNOSIS — M47816 Spondylosis without myelopathy or radiculopathy, lumbar region: Secondary | ICD-10-CM | POA: Diagnosis not present

## 2018-01-25 MED ORDER — PHENTERMINE HCL 37.5 MG PO TABS: ORAL_TABLET | ORAL | 0 refills | Status: DC

## 2018-01-25 NOTE — Progress Notes (Signed)
Subjective:    Patient ID: Michaela Dodson, female    DOB: 1971/03/08, 47 y.o.   MRN: [id number]  HPI  47 yo female with a history of sleeve gastrectomy performed August 12, 2015, to discuss weight loss options.  Was able to get down to 162 pounds.  She is now back up to 176.  She doesn't feel motivated to get up and go work out.  She has been working with a Physiological scientist who is also a Engineer, maintenance (IT).  Psychiatry has adjusted her medications and really hasn't notices a change in motivation.  She would like her hormones checked.  She does feel that feel better on the branded Risperdol. She ahd to switch to a generic and says she cried all weekend and then was able to get it changed to a different generic and it is better but not as good as the brand.     She also wonders if she could have ADD.  She notices that she has a very difficult time completing tasks and staying on task.  Even the people at work sometimes noticed that she is off to the next thing.  She wonders if this could even be contributing to her lack of motivation.  She says her skin on her abdomen has been super itchy the last  Couple fo days. She doesn't rememeber change in soaps, detergents, etc.  She has been using Benadryl cream on it.    Review of Systems  BP 119/76   Pulse 75   Ht 5\' 6"  (1.676 m)   Wt 176 lb (79.8 kg)   SpO2 100%   BMI 28.41 kg/m     No Known Allergies  Past Medical History:  Diagnosis Date  . Allergy   . Depression   . GERD (gastroesophageal reflux disease)    Pt states that this has gone away since her recent weight loss  . H/O thromboembolism    superficial-left medial thigh lipoma, vericose veins  . Obesity    Pt has lost the weight and is no longer obese    Past Surgical History:  Procedure Laterality Date  . BREAST BIOPSY    . CARPAL TUNNEL RELEASE Right 09/2012   Dr. Apolonio Schneiders  . CARPAL TUNNEL RELEASE Left 11/2013   Dr. Apolonio Schneiders  . TUBAL LIGATION    . uterine ablation      Social  History   Socioeconomic History  . Marital status: Married    Spouse name: Not on file  . Number of children: Not on file  . Years of education: Not on file  . Highest education level: Not on file  Social Needs  . Financial resource strain: Not on file  . Food insecurity - worry: Not on file  . Food insecurity - inability: Not on file  . Transportation needs - medical: Not on file  . Transportation needs - non-medical: Not on file  Occupational History  . Not on file  Tobacco Use  . Smoking status: Former Smoker    Types: Cigarettes    Last attempt to quit: 07/18/2015    Years since quitting: 2.5  . Smokeless tobacco: Never Used  . Tobacco comment: e-cigs  Substance and Sexual Activity  . Alcohol use: Yes  . Drug use: No  . Sexual activity: Yes    Birth control/protection: None  Other Topics Concern  . Not on file  Social History Narrative  . Not on file    Family History  Problem Relation Age  of Onset  . Breast cancer Mother   . Bladder Cancer Mother   . Colon cancer Maternal Grandmother     Outpatient Encounter Medications as of 01/25/2018  Medication Sig  . Cyanocobalamin (B-12 PO) Take by mouth.  . diclofenac (VOLTAREN) 75 MG EC tablet TAKE 1 TABLET BY MOUTH TWICE A DAY AFTER MEALS FOR INFLAMMATION/PAIN/SWELLING  . LAMICTAL 150 MG tablet Take 150 mg by mouth daily.  . Probiotic Product (PROBIOTIC DAILY PO) Take by mouth.  . risperiDONE (RISPERDAL) 1 MG tablet Take 1 mg by mouth at bedtime.  Marland Kitchen trimethoprim (TRIMPEX) 100 MG tablet Take 1 tablet (100 mg total) daily by mouth.  . [DISCONTINUED] fluticasone (FLONASE) 50 MCG/ACT nasal spray Place 2 sprays into both nostrils daily.  . [DISCONTINUED] gabapentin (NEURONTIN) 100 MG capsule 1 cap qHS as needed for a week, then BID for a week, then TID.  . [DISCONTINUED] metaxalone (SKELAXIN) 800 MG tablet TAKE ONE TAB BY MOUHT TWICE DAILY AS NEEDED  . phentermine (ADIPEX-P) 37.5 MG tablet 1/2-1 tab po qD  . [DISCONTINUED]  Multiple Vitamins-Calcium (ONE-A-DAY WOMENS PO) Take 1 capsule by mouth.  . [DISCONTINUED] RISPERDAL 0.5 MG tablet   . [DISCONTINUED] sulfamethoxazole-trimethoprim (BACTRIM DS,SEPTRA DS) 800-160 MG tablet Take 1 tablet 2 (two) times daily by mouth.   No facility-administered encounter medications on file as of 01/25/2018.          Objective:   Physical Exam  Constitutional: She is oriented to person, place, and time. She appears well-developed and well-nourished.  HENT:  Head: Normocephalic and atraumatic.  Cardiovascular: Normal rate, regular rhythm and normal heart sounds.  Pulmonary/Chest: Effort normal and breath sounds normal.  Neurological: She is alert and oriented to person, place, and time.  Skin: Skin is warm and dry.  No rash on the abdomen but she does have some excoriations  Psychiatric: She has a normal mood and affect. Her behavior is normal.          Assessment & Plan:  Abnormal weight gain/BMI of 28 - will check for thyroid d/o, anemia, and electrolyte disturbance.  She really wants to try phentermine which she is taken several times in the past to see if it will kick start her and get her a little bit more energy and help curb her appetite.  Inattention -did give her the adult screening questionnaire for ADHD.  I have known her for quite some many years and she definitely has some symptoms consistent with this.  I did encourage her to speak with her psychiatrist about more formal evaluation and testing.there is plenty of self-help books and info avaiable as well as medication options.    Skin itching - no rash on exam.  Try Hydrocortisone. Could be medication side effect. Call psych if needed.    Bipolar 1 disorder-she may also want to speak to psychiatry about asking the insurance for a tier exemption so they might cover the branded risperdal at a lower co-pay.

## 2018-01-26 LAB — CBC WITH DIFFERENTIAL/PLATELET
Basophils Absolute: 29 {cells}/uL (ref 0–200)
Basophils Relative: 0.4 %
Eosinophils Absolute: 43 {cells}/uL (ref 15–500)
Eosinophils Relative: 0.6 %
HCT: 38.5 % (ref 35.0–45.0)
Hemoglobin: 13.4 g/dL (ref 11.7–15.5)
Lymphs Abs: 2635 {cells}/uL (ref 850–3900)
MCH: 32.4 pg (ref 27.0–33.0)
MCHC: 34.8 g/dL (ref 32.0–36.0)
MCV: 93 fL (ref 80.0–100.0)
MPV: 11.3 fL (ref 7.5–12.5)
Monocytes Relative: 6.2 %
Neutro Abs: 4046 {cells}/uL (ref 1500–7800)
Neutrophils Relative %: 56.2 %
Platelets: 239 Thousand/uL (ref 140–400)
RBC: 4.14 Million/uL (ref 3.80–5.10)
RDW: 12.1 % (ref 11.0–15.0)
Total Lymphocyte: 36.6 %
WBC mixed population: 446 {cells}/uL (ref 200–950)
WBC: 7.2 Thousand/uL (ref 3.8–10.8)

## 2018-01-26 LAB — COMPLETE METABOLIC PANEL WITHOUT GFR
AG Ratio: 1.9 (calc) (ref 1.0–2.5)
ALT: 19 U/L (ref 6–29)
AST: 17 U/L (ref 10–35)
Albumin: 4.6 g/dL (ref 3.6–5.1)
Alkaline phosphatase (APISO): 42 U/L (ref 33–115)
BUN: 13 mg/dL (ref 7–25)
CO2: 29 mmol/L (ref 20–32)
Calcium: 9.6 mg/dL (ref 8.6–10.2)
Chloride: 102 mmol/L (ref 98–110)
Creat: 0.78 mg/dL (ref 0.50–1.10)
GFR, Est African American: 105 mL/min/1.73m2
GFR, Est Non African American: 91 mL/min/1.73m2
Globulin: 2.4 g/dL (ref 1.9–3.7)
Glucose, Bld: 93 mg/dL (ref 65–99)
Potassium: 4.3 mmol/L (ref 3.5–5.3)
Sodium: 140 mmol/L (ref 135–146)
Total Bilirubin: 2.2 mg/dL — ABNORMAL HIGH (ref 0.2–1.2)
Total Protein: 7 g/dL (ref 6.1–8.1)

## 2018-01-26 LAB — TSH: TSH: 1.45 m[IU]/L

## 2018-01-26 LAB — FOLLICLE STIMULATING HORMONE: FSH: 5.1 m[IU]/mL

## 2018-01-26 LAB — LUTEINIZING HORMONE: LH: 5.3 m[IU]/mL

## 2018-01-26 LAB — PROGESTERONE

## 2018-01-26 LAB — ESTRADIOL: Estradiol: 93 pg/mL

## 2018-01-27 ENCOUNTER — Ambulatory Visit: Payer: Self-pay | Admitting: Family Medicine

## 2018-02-08 DIAGNOSIS — M47816 Spondylosis without myelopathy or radiculopathy, lumbar region: Secondary | ICD-10-CM | POA: Diagnosis not present

## 2018-02-18 ENCOUNTER — Ambulatory Visit (INDEPENDENT_AMBULATORY_CARE_PROVIDER_SITE_OTHER): Payer: BLUE CROSS/BLUE SHIELD | Admitting: Sports Medicine

## 2018-02-18 ENCOUNTER — Ambulatory Visit (HOSPITAL_BASED_OUTPATIENT_CLINIC_OR_DEPARTMENT_OTHER)
Admission: RE | Admit: 2018-02-18 | Discharge: 2018-02-18 | Disposition: A | Discharge status: Home / Self Care | Payer: BLUE CROSS/BLUE SHIELD | Source: Ambulatory Visit | Attending: Sports Medicine | Admitting: Sports Medicine

## 2018-02-18 DIAGNOSIS — N1330 Unspecified hydronephrosis: Secondary | ICD-10-CM | POA: Diagnosis not present

## 2018-02-18 DIAGNOSIS — Z87442 Personal history of urinary calculi: Secondary | ICD-10-CM

## 2018-02-18 DIAGNOSIS — R109 Unspecified abdominal pain: Secondary | ICD-10-CM | POA: Diagnosis not present

## 2018-02-18 LAB — POCT URINALYSIS DIPSTICK
Bilirubin, UA: NEGATIVE
Glucose, UA: NEGATIVE
Leukocytes, UA: NEGATIVE
Nitrite, UA: NEGATIVE
Odor: NORMAL
Spec Grav, UA: >=1.03 — AB (ref 1.010–1.025)
Urobilinogen, UA: 0.2 E.U./dL
pH, UA: 5.5 (ref 5.0–8.0)

## 2018-02-18 MED ORDER — PREDNISONE 50 MG PO TABS: ORAL_TABLET | ORAL | 0 refills | Status: DC

## 2018-02-18 MED ORDER — PHENAZOPYRIDINE HCL 200 MG PO TABS: 200.0000 mg | ORAL_TABLET | Freq: Three times a day (TID) | ORAL | 0 refills | Status: AC

## 2018-02-18 MED ORDER — CIPROFLOXACIN HCL 750 MG PO TABS: 750.0000 mg | ORAL_TABLET | Freq: Two times a day (BID) | ORAL | 0 refills | Status: AC

## 2018-02-18 NOTE — Progress Notes (Signed)
Subjective:    CC: Flank pain  HPI: Michaela Dodson is a pleasant 47 year old female, she has known lumbar spondylosis, she also has a renal cyst with chronic scopic hematuria.  More recently she is developed increasing pain in her left flank at the costovertebral angle.  She is unable to quantify if this is positional, is worse with deep breathing, palpation, no frequency, urgency, no constitutional symptoms.  She is currently going through medial branch blocks in anticipation of radio frequency ablation with her pain doctors.  She could get in with them regarding her flank pain so she is here.  I reviewed the past medical history, family history, social history, surgical history, and allergies today and no changes were needed.  Please see the problem list section below in epic for further details.  Past Medical History: Past Medical History:  Diagnosis Date  . Allergy   . Depression   . GERD (gastroesophageal reflux disease)    Pt states that this has gone away since her recent weight loss  . H/O thromboembolism    superficial-left medial thigh lipoma, vericose veins  . Obesity    Pt has lost the weight and is no longer obese   Past Surgical History: Past Surgical History:  Procedure Laterality Date  . BREAST BIOPSY    . CARPAL TUNNEL RELEASE Right 09/2012   Dr. Apolonio Schneiders  . CARPAL TUNNEL RELEASE Left 11/2013   Dr. Apolonio Schneiders  . TUBAL LIGATION    . uterine ablation     Social History: Social History   Socioeconomic History  . Marital status: Married    Spouse name: None  . Number of children: None  . Years of education: None  . Highest education level: None  Social Needs  . Financial resource strain: None  . Food insecurity - worry: None  . Food insecurity - inability: None  . Transportation needs - medical: None  . Transportation needs - non-medical: None  Occupational History  . None  Tobacco Use  . Smoking status: Former Smoker    Types: Cigarettes    Last attempt to  quit: 07/18/2015    Years since quitting: 2.5  . Smokeless tobacco: Never Used  . Tobacco comment: e-cigs  Substance and Sexual Activity  . Alcohol use: Yes  . Drug use: No  . Sexual activity: Yes    Birth control/protection: None  Other Topics Concern  . None  Social History Narrative  . None   Family History: Family History  Problem Relation Age of Onset  . Breast cancer Mother   . Bladder Cancer Mother   . Colon cancer Maternal Grandmother    Allergies: No Known Allergies Medications: See med rec.  Review of Systems: No fevers, chills, night sweats, weight loss, chest pain, or shortness of breath.   Objective:    General: Well Developed, well nourished, and in no acute distress.  Neuro: Alert and oriented x3, extra-ocular muscles intact, sensation grossly intact.  HEENT: Normocephalic, atraumatic, pupils equal round reactive to light, neck supple, no masses, no lymphadenopathy, thyroid nonpalpable.  Skin: Warm and dry, no rashes. Cardiac: Regular rate and rhythm, no murmurs rubs or gallops, no lower extremity edema.  Respiratory: Clear to auscultation bilaterally. Not using accessory muscles, speaking in full sentences. Abdomen: Soft, left costovertebral angle pain, nondistended, normal bowel sounds, palpable masses, no guarding, rigidity, rebound tenderness.  Urinalysis is positive for blood  Impression and Recommendations:    H/O renal calculi Recurrence of left costovertebral angle pain with hematuria, she  has known renal cystic disease, benign that is likely the cause of persistent hematuria. Unfortunately with this recurrence of severe pain I am not sure whether this represents a new nephrolithiasis. Ultrasound, Pyridium, ciprofloxacin. In addition she does have axial type spinal back pain, currently going through medial branch blocks for radio frequency ablation with her pain doctor. Patient is unable to tell me whether this resembles her typical kidney pain or  her typical back pain, so we are also going to do a burst of prednisone to give her some relief in the meantime. I explained that she really needs to get back in touch with her pain providers for further evaluation and definitive treatment.  ___________________________________________ Gwen Her. Dianah Field, M.D., ABFM., CAQSM. Primary Care and [place] Instructor of [place] of [facility] of Medicine

## 2018-02-18 NOTE — Assessment & Plan Note (Signed)
Recurrence of left costovertebral angle pain with hematuria, she has known renal cystic disease, benign that is likely the cause of persistent hematuria. Unfortunately with this recurrence of severe pain I am not sure whether this represents a new nephrolithiasis. Ultrasound, Pyridium, ciprofloxacin. In addition she does have axial type spinal back pain, currently going through medial branch blocks for radio frequency ablation with her pain doctor. Patient is unable to tell me whether this resembles her typical kidney pain or her typical back pain, so we are also going to do a burst of prednisone to give her some relief in the meantime. I explained that she really needs to get back in touch with her pain providers for further evaluation and definitive treatment.

## 2018-02-20 LAB — URINE CULTURE
MICRO NUMBER:: 90237706
SPECIMEN QUALITY:: ADEQUATE

## 2018-02-24 ENCOUNTER — Encounter: Payer: Self-pay | Admitting: Sports Medicine

## 2018-02-24 DIAGNOSIS — M47816 Spondylosis without myelopathy or radiculopathy, lumbar region: Secondary | ICD-10-CM

## 2018-02-24 MED ORDER — PREDNISONE 10 MG (48) PO TBPK
ORAL_TABLET | Freq: Every day | ORAL | 0 refills | Status: DC
Start: 2018-02-24 — End: 2018-03-04

## 2018-03-02 LAB — ANA, IFA COMPREHENSIVE PANEL
Anti Nuclear Antibody(ANA): NEGATIVE
ENA SM Ab Ser-aCnc: <1 AI
SM/RNP: <1 AI
SSA (Ro) (ENA) Antibody, IgG: <1 AI
SSB (La) (ENA) Antibody, IgG: <1 AI
Scleroderma (Scl-70) (ENA) Antibody, IgG: <1 AI
ds DNA Ab: 1 [IU]/mL

## 2018-03-02 LAB — PROTEIN ELECTROPHORESIS, SERUM, WITH REFLEX
Albumin ELP: 4.7 g/dL (ref 3.8–4.8)
Alpha 1: 0.3 g/dL (ref 0.2–0.3)
Alpha 2: 0.6 g/dL (ref 0.5–0.9)
Beta 2: 0.2 g/dL (ref 0.2–0.5)
Beta Globulin: 0.4 g/dL (ref 0.4–0.6)
Gamma Globulin: 0.9 g/dL (ref 0.8–1.7)
Total Protein: 7.1 g/dL (ref 6.1–8.1)

## 2018-03-02 LAB — RHEUMATOID ARTHRITIS DIAGNOSTIC PANEL, COMPREHENSIVE
Cyclic Citrullin Peptide Ab: <16 U (ref <20)
Rheumatoid Factor (IgA): <5 U (ref <=6)
Rheumatoid Factor (IgG): <5 U (ref <=6)
Rheumatoid Factor (IgM): 5 U (ref <=6)
SSA (Ro) (ENA) Antibody, IgG: <1 AI (ref <1.0)
SSB (La) (ENA) Antibody, IgG: <1 AI (ref <1.0)

## 2018-03-02 LAB — SEDIMENTATION RATE: Sed Rate: 2 mm/h (ref 0–20)

## 2018-03-02 LAB — CK: Total CK: 44 U/L (ref 29–143)

## 2018-03-02 LAB — HLA-B27 ANTIGEN: HLA-B27 Antigen: NEGATIVE

## 2018-03-02 LAB — URIC ACID: Uric Acid, Serum: 5 mg/dL (ref 2.5–7.0)

## 2018-03-04 ENCOUNTER — Ambulatory Visit (INDEPENDENT_AMBULATORY_CARE_PROVIDER_SITE_OTHER): Payer: BLUE CROSS/BLUE SHIELD | Admitting: Sports Medicine

## 2018-03-04 ENCOUNTER — Encounter: Payer: Self-pay | Admitting: Sports Medicine

## 2018-03-04 DIAGNOSIS — M47816 Spondylosis without myelopathy or radiculopathy, lumbar region: Secondary | ICD-10-CM

## 2018-03-04 NOTE — Progress Notes (Signed)
Subjective:    CC: Follow-up  HPI: Low back pain: Known facet mediated pain, she had fantastic relief with L3-L5 facet joint injections with [facility], now working on getting approval for radiofrequency ablation but having trouble, we did a couple of doses of prednisone including a taper, symptoms improved considerably.  She is also started CBD oil which she swears by, no further intervention needed from our perspective.  I reviewed the past medical history, family history, social history, surgical history, and allergies today and no changes were needed.  Please see the problem list section below in epic for further details.  Past Medical History: Past Medical History:  Diagnosis Date  . Allergy   . Depression   . GERD (gastroesophageal reflux disease)    Pt states that this has gone away since her recent weight loss  . H/O thromboembolism    superficial-left medial thigh lipoma, vericose veins  . Obesity    Pt has lost the weight and is no longer obese   Past Surgical History: Past Surgical History:  Procedure Laterality Date  . BREAST BIOPSY    . CARPAL TUNNEL RELEASE Right 09/2012   Dr. Apolonio Schneiders  . CARPAL TUNNEL RELEASE Left 11/2013   Dr. Apolonio Schneiders  . TUBAL LIGATION    . uterine ablation     Social History: Social History   Socioeconomic History  . Marital status: Married    Spouse name: None  . Number of children: None  . Years of education: None  . Highest education level: None  Social Needs  . Financial resource strain: None  . Food insecurity - worry: None  . Food insecurity - inability: None  . Transportation needs - medical: None  . Transportation needs - non-medical: None  Occupational History  . None  Tobacco Use  . Smoking status: Former Smoker    Types: Cigarettes    Last attempt to quit: 07/18/2015    Years since quitting: 2.6  . Smokeless tobacco: Never Used  . Tobacco comment: e-cigs  Substance and Sexual Activity  . Alcohol use: Yes  .  Drug use: No  . Sexual activity: Yes    Birth control/protection: None  Other Topics Concern  . None  Social History Narrative  . None   Family History: Family History  Problem Relation Age of Onset  . Breast cancer Mother   . Bladder Cancer Mother   . Colon cancer Maternal Grandmother    Allergies: No Known Allergies Medications: See med rec.  Review of Systems: No fevers, chills, night sweats, weight loss, chest pain, or shortness of breath.   Objective:    General: Well Developed, well nourished, and in no acute distress.  Neuro: Alert and oriented x3, extra-ocular muscles intact, sensation grossly intact.  HEENT: Normocephalic, atraumatic, pupils equal round reactive to light, neck supple, no masses, no lymphadenopathy, thyroid nonpalpable.  Skin: Warm and dry, no rashes. Cardiac: Regular rate and rhythm, no murmurs rubs or gallops, no lower extremity edema.  Respiratory: Clear to auscultation bilaterally. Not using accessory muscles, speaking in full sentences.  Impression and Recommendations:    Lumbar facet arthropathy Axial back pain, currently undergoing treatment at [facility] in anticipation of radiofrequency ablation. She did have 100% temporary pain relief after multiple facet joint injections. Currently working on approval from her insurance. We did do a burst of prednisone followed by a taper that provided fantastic relief, as well as a full rheumatoid workup that was negative. She has started CBD oil which  seems to be helping as well, though difficult to tell whether with the prednisone or the CBD oil. She does need records document in her physical therapy, I suspect she had this at [facility]. ___________________________________________ Gwen Her. Dianah Field, M.D., ABFM., CAQSM. Primary Care and [place] Instructor of [place] of [facility] of  Medicine

## 2018-03-04 NOTE — Assessment & Plan Note (Signed)
Axial back pain, currently undergoing treatment at [facility] in anticipation of radiofrequency ablation. She did have 100% temporary pain relief after multiple facet joint injections. Currently working on approval from her insurance. We did do a burst of prednisone followed by a taper that provided fantastic relief, as well as a full rheumatoid workup that was negative. She has started CBD oil which seems to be helping as well, though difficult to tell whether with the prednisone or the CBD oil. She does need records document in her physical therapy, I suspect she had this at [facility].

## 2018-03-28 ENCOUNTER — Encounter: Payer: Self-pay | Admitting: Family Medicine

## 2018-03-28 ENCOUNTER — Ambulatory Visit (INDEPENDENT_AMBULATORY_CARE_PROVIDER_SITE_OTHER): Payer: BLUE CROSS/BLUE SHIELD | Admitting: Family Medicine

## 2018-03-28 ENCOUNTER — Ambulatory Visit: Payer: BLUE CROSS/BLUE SHIELD | Admitting: Physician Assistant

## 2018-03-28 VITALS — BP 116/76 | HR 70 | Temp 98.2°F | Ht 65.98 in | Wt 174.0 lb

## 2018-03-28 DIAGNOSIS — N3001 Acute cystitis with hematuria: Secondary | ICD-10-CM

## 2018-03-28 LAB — POCT URINALYSIS DIPSTICK
BILIRUBIN UA: NEGATIVE
Glucose, UA: NEGATIVE
KETONES UA: NEGATIVE
Leukocytes, UA: NEGATIVE
Nitrite, UA: NEGATIVE
Protein, UA: NEGATIVE
UROBILINOGEN UA: 0.2 U/dL
pH, UA: 6 (ref 5.0–8.0)

## 2018-03-28 MED ORDER — CEPHALEXIN 500 MG PO CAPS: 500.0000 mg | ORAL_CAPSULE | Freq: Three times a day (TID) | ORAL | 0 refills | Status: DC

## 2018-03-28 NOTE — Progress Notes (Signed)
   Subjective:    Patient ID: Michaela Dodson, female    DOB: [birth year], 47 y.o.   MRN: [id number]  HPI  47 year old female comes in today complaining of urinary frequency that started last Wednesday, by Saturday she was actually having severe pain  noticed blood dripping into the toilet.  She took some over-the-counter Cystex and that did provide her some relief.  No fevers chills or sweats.  He has not had a UTI in 5 months since being on trimethoprim.  She says it was extremely. She still has some discomfort but much better on the Cystex.  Review of Systems     Objective:   Physical Exam  Constitutional: She is oriented to person, place, and time. She appears well-developed and well-nourished.  HENT:  Head: Normocephalic and atraumatic.  Eyes: Conjunctivae and EOM are normal.  Cardiovascular: Normal rate.  Pulmonary/Chest: Effort normal.  Abdominal: Soft.  No suprapubic tenderness.    Musculoskeletal:  No CVA tenderness.  Neurological: She is alert and oriented to person, place, and time.  Skin: Skin is dry. No pallor.  Psychiatric: She has a normal mood and affect. Her behavior is normal.  Vitals reviewed.       Assessment & Plan:  UTI - will tx with keflex.  Will send for culture since she does take trimethoprim daily for prophylaxis.  She actually has done really well and has not had a UTI since starting prophylaxis.

## 2018-03-29 LAB — URINE CULTURE
MICRO NUMBER: 90401035
Result:: NO GROWTH
SPECIMEN QUALITY:: ADEQUATE

## 2018-03-30 ENCOUNTER — Encounter: Payer: Self-pay | Admitting: Family Medicine

## 2018-03-30 ENCOUNTER — Encounter: Payer: Self-pay | Admitting: Sports Medicine

## 2018-03-31 ENCOUNTER — Ambulatory Visit (INDEPENDENT_AMBULATORY_CARE_PROVIDER_SITE_OTHER): Payer: BLUE CROSS/BLUE SHIELD | Admitting: Sports Medicine

## 2018-03-31 VITALS — BP 125/83 | HR 80 | Temp 99.2°F | Wt 174.0 lb

## 2018-03-31 DIAGNOSIS — M170 Bilateral primary osteoarthritis of knee: Secondary | ICD-10-CM | POA: Diagnosis not present

## 2018-03-31 NOTE — Progress Notes (Signed)
Pt presented today for Right Knee reaction brace. Denies pain, swelling or limited movement on right leg. Pt's blood reading pressure for today was 125/83, pulse 80. Pt declined for reaction brace to be strapped on her right knee. As per pt - "she's been through this before, I know how to put the brace on". Pt was informed to keep current plan as discuss with Dr. Dianah Field & that we will call her with any changes or updates.

## 2018-06-05 ENCOUNTER — Encounter: Payer: Self-pay | Admitting: Sports Medicine

## 2018-06-05 DIAGNOSIS — M7989 Other specified soft tissue disorders: Secondary | ICD-10-CM

## 2018-06-06 ENCOUNTER — Ambulatory Visit (INDEPENDENT_AMBULATORY_CARE_PROVIDER_SITE_OTHER): Payer: BLUE CROSS/BLUE SHIELD | Admitting: Sports Medicine

## 2018-06-06 ENCOUNTER — Ambulatory Visit: Payer: BLUE CROSS/BLUE SHIELD

## 2018-06-06 DIAGNOSIS — I83892 Varicose veins of left lower extremities with other complications: Secondary | ICD-10-CM

## 2018-06-06 DIAGNOSIS — M79662 Pain in left lower leg: Secondary | ICD-10-CM | POA: Diagnosis not present

## 2018-06-06 DIAGNOSIS — M7989 Other specified soft tissue disorders: Secondary | ICD-10-CM | POA: Diagnosis not present

## 2018-06-06 NOTE — Assessment & Plan Note (Signed)
Patient's initial concern was for DVT so we got it stat lower extremity Doppler, this was negative. She does have history of a superficial venous thrombosis in the saphenous vein, this is also no longer present. On exam there are several lumpy parts of tortuous lower extreme any veins consistent with venous varicosities, nontender and no palpable superficial thromboses. She has not been wearing her lower extremity compression stockings. We discussed the pathophysiology and anatomy of venous insufficiency, she will focus more elevation and compression, and let me know if she would like a referral to a venous specialist for consideration of vein stripping and ablation.

## 2018-06-06 NOTE — Progress Notes (Signed)
Subjective:    CC: Leg swelling  HPI: Michaela Dodson is a a pleasant 47 year old female with a history of a saphenous vein superficial venous thrombosis on the left side.  Recently she noted increasing swelling of her left leg, lumpiness.  Minimal pain.  Moderate, persistent.  I reviewed the past medical history, family history, social history, surgical history, and allergies today and no changes were needed.  Please see the problem list section below in epic for further details.  Past Medical History: Past Medical History:  Diagnosis Date  . Allergy   . Depression   . GERD (gastroesophageal reflux disease)    Pt states that this has gone away since her recent weight loss  . H/O thromboembolism    superficial-left medial thigh lipoma, vericose veins  . Obesity    Pt has lost the weight and is no longer obese   Past Surgical History: Past Surgical History:  Procedure Laterality Date  . BREAST BIOPSY    . CARPAL TUNNEL RELEASE Right 09/2012   Dr. Apolonio Schneiders  . CARPAL TUNNEL RELEASE Left 11/2013   Dr. Apolonio Schneiders  . TUBAL LIGATION    . uterine ablation     Social History: Social History   Socioeconomic History  . Marital status: Married    Spouse name: Not on file  . Number of children: Not on file  . Years of education: Not on file  . Highest education level: Not on file  Occupational History  . Not on file  Social Needs  . Financial resource strain: Not on file  . Food insecurity:    Worry: Not on file    Inability: Not on file  . Transportation needs:    Medical: Not on file    Non-medical: Not on file  Tobacco Use  . Smoking status: Former Smoker    Types: Cigarettes    Last attempt to quit: 07/18/2015    Years since quitting: 2.8  . Smokeless tobacco: Never Used  . Tobacco comment: e-cigs  Substance and Sexual Activity  . Alcohol use: Yes  . Drug use: No  . Sexual activity: Yes    Birth control/protection: None  Lifestyle  . Physical activity:    Days per week:  Not on file    Minutes per session: Not on file  . Stress: Not on file  Relationships  . Social connections:    Talks on phone: Not on file    Gets together: Not on file    Attends religious service: Not on file    Active member of club or organization: Not on file    Attends meetings of clubs or organizations: Not on file    Relationship status: Not on file  Other Topics Concern  . Not on file  Social History Narrative  . Not on file   Family History: Family History  Problem Relation Age of Onset  . Breast cancer Mother   . Bladder Cancer Mother   . Colon cancer Maternal Grandmother    Allergies: No Known Allergies Medications: See med rec.  Review of Systems: No fevers, chills, night sweats, weight loss, chest pain, or shortness of breath.   Objective:    General: Well Developed, well nourished, and in no acute distress.  Neuro: Alert and oriented x3, extra-ocular muscles intact, sensation grossly intact.  HEENT: Normocephalic, atraumatic, pupils equal round reactive to light, neck supple, no masses, no lymphadenopathy, thyroid nonpalpable.  Skin: Warm and dry, no rashes. Cardiac: Regular rate and rhythm, no murmurs  rubs or gallops, no lower extremity edema.  Respiratory: Clear to auscultation bilaterally. Not using accessory muscles, speaking in full sentences. Left leg: Slightly swollen with a negative Homans sign, visible and palpable significant varicosities.  Impression and Recommendations:    Varicose veins of left leg with edema Patient's initial concern was for DVT so we got it stat lower extremity Doppler, this was negative. She does have history of a superficial venous thrombosis in the saphenous vein, this is also no longer present. On exam there are several lumpy parts of tortuous lower extreme any veins consistent with venous varicosities, nontender and no palpable superficial thromboses. She has not been wearing her lower extremity compression  stockings. We discussed the pathophysiology and anatomy of venous insufficiency, she will focus more elevation and compression, and let me know if she would like a referral to a venous specialist for consideration of vein stripping and ablation. ___________________________________________ Gwen Her. Dianah Field, M.D., ABFM., CAQSM. Primary Care and [place] Instructor of [place] of [facility] of Medicine

## 2018-06-16 ENCOUNTER — Encounter: Payer: Self-pay | Admitting: Sports Medicine

## 2018-07-11 DIAGNOSIS — F3110 Bipolar disorder, current episode manic without psychotic features, unspecified: Secondary | ICD-10-CM | POA: Diagnosis not present

## 2018-07-28 DIAGNOSIS — Z903 Acquired absence of stomach [part of]: Secondary | ICD-10-CM | POA: Diagnosis not present

## 2018-07-28 DIAGNOSIS — K912 Postsurgical malabsorption, not elsewhere classified: Secondary | ICD-10-CM | POA: Diagnosis not present

## 2018-07-30 ENCOUNTER — Encounter: Payer: Self-pay | Admitting: Family Medicine

## 2018-08-16 DIAGNOSIS — K912 Postsurgical malabsorption, not elsewhere classified: Secondary | ICD-10-CM | POA: Diagnosis not present

## 2018-08-16 DIAGNOSIS — G479 Sleep disorder, unspecified: Secondary | ICD-10-CM | POA: Diagnosis not present

## 2018-08-16 DIAGNOSIS — Z903 Acquired absence of stomach [part of]: Secondary | ICD-10-CM | POA: Diagnosis not present

## 2018-08-16 DIAGNOSIS — K219 Gastro-esophageal reflux disease without esophagitis: Secondary | ICD-10-CM | POA: Diagnosis not present

## 2018-08-22 ENCOUNTER — Encounter: Payer: Self-pay | Admitting: Family Medicine

## 2018-08-31 NOTE — Telephone Encounter (Signed)
Pt  called. She wants to go ahead with the hormone replacement and wants to know if she needs to come in or can it be called in?

## 2018-09-06 ENCOUNTER — Ambulatory Visit (INDEPENDENT_AMBULATORY_CARE_PROVIDER_SITE_OTHER): Payer: BLUE CROSS/BLUE SHIELD | Admitting: Family Medicine

## 2018-09-06 ENCOUNTER — Encounter: Payer: Self-pay | Admitting: Family Medicine

## 2018-09-06 VITALS — BP 126/72 | HR 67 | Ht 66.0 in | Wt 168.0 lb

## 2018-09-06 DIAGNOSIS — R4589 Other symptoms and signs involving emotional state: Secondary | ICD-10-CM

## 2018-09-06 DIAGNOSIS — N912 Amenorrhea, unspecified: Secondary | ICD-10-CM | POA: Diagnosis not present

## 2018-09-06 DIAGNOSIS — F3132 Bipolar disorder, current episode depressed, moderate: Secondary | ICD-10-CM

## 2018-09-06 DIAGNOSIS — R4586 Emotional lability: Secondary | ICD-10-CM | POA: Diagnosis not present

## 2018-09-06 DIAGNOSIS — R6889 Other general symptoms and signs: Secondary | ICD-10-CM

## 2018-09-06 DIAGNOSIS — Z803 Family history of malignant neoplasm of breast: Secondary | ICD-10-CM

## 2018-09-06 NOTE — Progress Notes (Addendum)
Subjective:    Patient ID: Michaela Dodson, female    DOB: [birth year], 47 y.o.   MRN: [id number]  HPI 47 year old female with a history of bipolar disorder comes in today to discuss possible hormone replacement therapy.  She has been working with her psychiatrist and currently on 4 different mood occasions and they are just having significant difficulty getting really good control of her symptoms.  They had suggested may be looking into whether or not she could be perimenopausal.  She actually had a uterine ablation done about 15 years ago so has not had a menstrual cycle to gauge whether or not she might be becoming perimenopausal.  She wanted to see if hormone replacement therapy could even be an option.  He has had some vaginal dryness and some discomfort with sex.  Her sleep has been okay overall.  She says occasionally she will have some nights where she does not sleep well but it is not a persistent problem.  She denies hot flashes and says she is actually had more cold intolerance.  Feels like if she is gotten older her anxiety levels have increased significantly.  She says at times she will get really irritable and angry.  She will lash out at her loved ones.  They have actually been very supportive of her.  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  Recently she is gotten obsessed with the news and constantly watching it and sitting up pop up on her phone and her watch.  She is worried that she will miss something.  She is worried that her child will get shot at school.  She is worried something will happen at her husband's work.  Her motivation has decreased and in fact she is actually quit exercising and running.  Review of Systems  BP 126/72   Pulse 67   Ht 5\' 6"  (1.676 m)   Wt 168 lb (76.2 kg)   SpO2 98%    BMI 27.12 kg/m     No Known Allergies  Past Medical History:  Diagnosis Date  . Allergy   . Depression   . GERD (gastroesophageal reflux disease)    Pt states that this has gone away since her recent weight loss  . H/O thromboembolism    superficial-left medial thigh lipoma, vericose veins  . Obesity    Pt has lost the weight and is no longer obese    Past Surgical History:  Procedure Laterality Date  . BREAST BIOPSY    . CARPAL TUNNEL RELEASE Right 09/2012   Dr. Apolonio Schneiders  . CARPAL TUNNEL RELEASE Left 11/2013   Dr. Apolonio Schneiders  . TUBAL LIGATION    . uterine ablation      Social History   Socioeconomic History  . Marital status: Married    Spouse name: Not on file  . Number of children: Not on file  . Years of education: Not on file  . Highest education level: Not on file  Occupational History  . Not on file  Social Needs  . Financial resource strain: Not on file  . Food insecurity:    Worry: Not on file    Inability: Not on file  . Transportation needs:    Medical: Not on file    Non-medical: Not on file  Tobacco Use  .  Smoking status: Former Smoker    Types: Cigarettes    Last attempt to quit: 07/18/2015    Years since quitting: 3.1  . Smokeless tobacco: Never Used  . Tobacco comment: e-cigs  Substance and Sexual Activity  . Alcohol use: Yes  . Drug use: No  . Sexual activity: Yes    Birth control/protection: None  Lifestyle  . Physical activity:    Days per week: Not on file    Minutes per session: Not on file  . Stress: Not on file  Relationships  . Social connections:    Talks on phone: Not on file    Gets together: Not on file    Attends religious service: Not on file    Active member of club or organization: Not on file    Attends meetings of clubs or organizations: Not on file    Relationship status: Not on file  . Intimate partner violence:    Fear of current or ex partner: Not on file    Emotionally abused: Not on file    Physically abused:  Not on file    Forced sexual activity: Not on file  Other Topics Concern  . Not on file  Social History Narrative  . Not on file    Family History  Problem Relation Age of Onset  . Breast cancer Mother   . Bladder Cancer Mother   . Colon cancer Maternal Grandmother     Outpatient Encounter Medications as of 09/06/2018  Medication Sig  . Cyanocobalamin (B-12 PO) Take by mouth.  Marland Kitchen LAMICTAL 150 MG tablet Take 150 mg by mouth daily.  . Probiotic Product (PROBIOTIC DAILY PO) Take by mouth.  . risperiDONE (RISPERDAL) 1 MG tablet Take 1 mg by mouth at bedtime.  Marland Kitchen trimethoprim (TRIMPEX) 100 MG tablet Take 1 tablet (100 mg total) daily by mouth.  . TRINTELLIX 5 MG TABS tablet Take 5 mg by mouth daily.   No facility-administered encounter medications on file as of 09/06/2018.          Objective:   Physical Exam  Constitutional: She is oriented to person, place, and time. She appears well-developed and well-nourished.  HENT:  Head: Normocephalic and atraumatic.  Eyes: Conjunctivae and EOM are normal.  Cardiovascular: Normal rate.  Pulmonary/Chest: Effort normal.  Neurological: She is alert and oriented to person, place, and time.  Skin: Skin is dry. No pallor.  Psychiatric: She has a normal mood and affect. Her behavior is normal.  Vitals reviewed.      Assessment & Plan:  Intolerance to cold-check thyroid level.  Amenorrhea-we will check hormone levels.  She has had an endometrial ablation.  So it is difficult to determine whether or not she is menopausal or perimenopausal so we will get further test to differentiate this.  Gust the option of hormone replacement therapy since she does still have a uterus we would need to do estrogen and progesterone.  We warned about potential side effects including increased risk for breast cancer.  She does have a first-degree relative, her mother, with a diagnosis of breast cancer and she does have a personal history of a superficial blood  clot.  Is not a smoker.  She does understand that estrogen can increase her risk for breast cancer and for blood clots.   Bipolar disorder-with increased mood swings and tearfulness.  Working with psychiatry.  Her regimen just does not seem to be quite controlling her symptoms very well.  Will search for other causes.  Discussed lowering her anxiety levels by removing some of the pop ups that she gets more news flashes.

## 2018-09-07 LAB — TSH: TSH: 1.19 mIU/L

## 2018-09-07 LAB — PROGESTERONE

## 2018-09-07 LAB — LUTEINIZING HORMONE: LH: 5.7 m[IU]/mL

## 2018-09-07 LAB — ESTRADIOL: ESTRADIOL: 72 pg/mL

## 2018-09-07 LAB — FOLLICLE STIMULATING HORMONE: FSH: 7 m[IU]/mL

## 2018-09-09 MED ORDER — ESTRADIOL-NORETHINDRONE ACET 0.5-0.1 MG PO TABS: 1.0000 | ORAL_TABLET | Freq: Every day | ORAL | 6 refills | Status: DC

## 2018-09-09 NOTE — Addendum Note (Signed)
Addended by: [name] D on: 09/09/2018 10:03 AM   Modules accepted: Orders

## 2018-09-19 DIAGNOSIS — F3110 Bipolar disorder, current episode manic without psychotic features, unspecified: Secondary | ICD-10-CM | POA: Diagnosis not present

## 2018-09-26 DIAGNOSIS — F332 Major depressive disorder, recurrent severe without psychotic features: Secondary | ICD-10-CM | POA: Diagnosis not present

## 2018-09-27 DIAGNOSIS — F332 Major depressive disorder, recurrent severe without psychotic features: Secondary | ICD-10-CM | POA: Diagnosis not present

## 2018-09-28 DIAGNOSIS — F332 Major depressive disorder, recurrent severe without psychotic features: Secondary | ICD-10-CM | POA: Diagnosis not present

## 2018-09-29 ENCOUNTER — Encounter: Payer: Self-pay | Admitting: Family Medicine

## 2018-09-29 DIAGNOSIS — Z79899 Other long term (current) drug therapy: Secondary | ICD-10-CM | POA: Diagnosis not present

## 2018-09-29 DIAGNOSIS — F332 Major depressive disorder, recurrent severe without psychotic features: Secondary | ICD-10-CM | POA: Diagnosis not present

## 2018-09-30 ENCOUNTER — Encounter: Payer: Self-pay | Admitting: Family Medicine

## 2018-09-30 DIAGNOSIS — F332 Major depressive disorder, recurrent severe without psychotic features: Secondary | ICD-10-CM | POA: Diagnosis not present

## 2018-10-03 DIAGNOSIS — F332 Major depressive disorder, recurrent severe without psychotic features: Secondary | ICD-10-CM | POA: Diagnosis not present

## 2018-10-04 DIAGNOSIS — F332 Major depressive disorder, recurrent severe without psychotic features: Secondary | ICD-10-CM | POA: Diagnosis not present

## 2018-10-05 DIAGNOSIS — F332 Major depressive disorder, recurrent severe without psychotic features: Secondary | ICD-10-CM | POA: Diagnosis not present

## 2018-10-06 DIAGNOSIS — F332 Major depressive disorder, recurrent severe without psychotic features: Secondary | ICD-10-CM | POA: Diagnosis not present

## 2018-10-07 DIAGNOSIS — F332 Major depressive disorder, recurrent severe without psychotic features: Secondary | ICD-10-CM | POA: Diagnosis not present

## 2018-10-10 DIAGNOSIS — Z1231 Encounter for screening mammogram for malignant neoplasm of breast: Secondary | ICD-10-CM | POA: Diagnosis not present

## 2018-10-10 LAB — HM MAMMOGRAPHY

## 2018-10-11 DIAGNOSIS — F3181 Bipolar II disorder: Secondary | ICD-10-CM | POA: Diagnosis not present

## 2018-10-12 ENCOUNTER — Encounter: Payer: Self-pay | Admitting: Family Medicine

## 2018-10-13 DIAGNOSIS — F3181 Bipolar II disorder: Secondary | ICD-10-CM | POA: Diagnosis not present

## 2018-10-17 ENCOUNTER — Encounter: Payer: Self-pay | Admitting: Family Medicine

## 2018-10-17 DIAGNOSIS — F3181 Bipolar II disorder: Secondary | ICD-10-CM | POA: Diagnosis not present

## 2018-10-18 DIAGNOSIS — F3110 Bipolar disorder, current episode manic without psychotic features, unspecified: Secondary | ICD-10-CM | POA: Diagnosis not present

## 2018-10-19 DIAGNOSIS — F3181 Bipolar II disorder: Secondary | ICD-10-CM | POA: Diagnosis not present

## 2018-10-20 ENCOUNTER — Encounter: Payer: Self-pay | Admitting: Family Medicine

## 2018-10-21 DIAGNOSIS — F3181 Bipolar II disorder: Secondary | ICD-10-CM | POA: Diagnosis not present

## 2018-10-25 ENCOUNTER — Ambulatory Visit: Payer: BLUE CROSS/BLUE SHIELD | Admitting: Family Medicine

## 2018-10-25 DIAGNOSIS — F3181 Bipolar II disorder: Secondary | ICD-10-CM | POA: Diagnosis not present

## 2018-10-26 DIAGNOSIS — F3181 Bipolar II disorder: Secondary | ICD-10-CM | POA: Diagnosis not present

## 2018-10-31 ENCOUNTER — Encounter: Payer: BLUE CROSS/BLUE SHIELD | Admitting: Family Medicine

## 2018-11-02 DIAGNOSIS — F3181 Bipolar II disorder: Secondary | ICD-10-CM | POA: Diagnosis not present

## 2018-11-02 DIAGNOSIS — F411 Generalized anxiety disorder: Secondary | ICD-10-CM | POA: Diagnosis not present

## 2018-11-03 ENCOUNTER — Encounter: Payer: Self-pay | Admitting: Sports Medicine

## 2018-11-03 MED ORDER — PREDNISONE 50 MG PO TABS: ORAL_TABLET | ORAL | 0 refills | Status: DC

## 2018-11-07 ENCOUNTER — Other Ambulatory Visit: Payer: Self-pay | Admitting: Family Medicine

## 2018-11-10 DIAGNOSIS — F411 Generalized anxiety disorder: Secondary | ICD-10-CM | POA: Diagnosis not present

## 2018-11-10 DIAGNOSIS — F3181 Bipolar II disorder: Secondary | ICD-10-CM | POA: Diagnosis not present

## 2018-11-14 ENCOUNTER — Telehealth: Payer: Self-pay | Admitting: Family Medicine

## 2018-11-14 DIAGNOSIS — F3181 Bipolar II disorder: Secondary | ICD-10-CM | POA: Diagnosis not present

## 2018-11-14 DIAGNOSIS — F411 Generalized anxiety disorder: Secondary | ICD-10-CM | POA: Diagnosis not present

## 2018-11-14 NOTE — Telephone Encounter (Signed)
Chart message sent in regards to her pharmacogenomic testing panel that she had done at [facility].

## 2018-11-15 DIAGNOSIS — F3181 Bipolar II disorder: Secondary | ICD-10-CM | POA: Diagnosis not present

## 2018-11-15 DIAGNOSIS — F411 Generalized anxiety disorder: Secondary | ICD-10-CM | POA: Diagnosis not present

## 2018-11-17 DIAGNOSIS — F411 Generalized anxiety disorder: Secondary | ICD-10-CM | POA: Diagnosis not present

## 2018-11-17 DIAGNOSIS — F3181 Bipolar II disorder: Secondary | ICD-10-CM | POA: Diagnosis not present

## 2018-11-21 DIAGNOSIS — F3181 Bipolar II disorder: Secondary | ICD-10-CM | POA: Diagnosis not present

## 2018-11-21 DIAGNOSIS — F411 Generalized anxiety disorder: Secondary | ICD-10-CM | POA: Diagnosis not present

## 2018-11-22 DIAGNOSIS — F3110 Bipolar disorder, current episode manic without psychotic features, unspecified: Secondary | ICD-10-CM | POA: Diagnosis not present

## 2018-11-23 DIAGNOSIS — F3181 Bipolar II disorder: Secondary | ICD-10-CM | POA: Diagnosis not present

## 2018-11-23 DIAGNOSIS — F411 Generalized anxiety disorder: Secondary | ICD-10-CM | POA: Diagnosis not present

## 2018-11-28 DIAGNOSIS — F3181 Bipolar II disorder: Secondary | ICD-10-CM | POA: Diagnosis not present

## 2018-11-28 DIAGNOSIS — F411 Generalized anxiety disorder: Secondary | ICD-10-CM | POA: Diagnosis not present

## 2018-11-30 DIAGNOSIS — F3181 Bipolar II disorder: Secondary | ICD-10-CM | POA: Diagnosis not present

## 2018-11-30 DIAGNOSIS — F411 Generalized anxiety disorder: Secondary | ICD-10-CM | POA: Diagnosis not present

## 2018-12-05 DIAGNOSIS — F3181 Bipolar II disorder: Secondary | ICD-10-CM | POA: Diagnosis not present

## 2018-12-05 DIAGNOSIS — F3110 Bipolar disorder, current episode manic without psychotic features, unspecified: Secondary | ICD-10-CM | POA: Diagnosis not present

## 2018-12-05 DIAGNOSIS — F411 Generalized anxiety disorder: Secondary | ICD-10-CM | POA: Diagnosis not present

## 2018-12-07 DIAGNOSIS — F3110 Bipolar disorder, current episode manic without psychotic features, unspecified: Secondary | ICD-10-CM | POA: Diagnosis not present

## 2018-12-13 ENCOUNTER — Ambulatory Visit (INDEPENDENT_AMBULATORY_CARE_PROVIDER_SITE_OTHER): Payer: BLUE CROSS/BLUE SHIELD | Admitting: Family Medicine

## 2018-12-13 ENCOUNTER — Encounter: Payer: Self-pay | Admitting: Family Medicine

## 2018-12-13 VITALS — BP 115/60 | HR 86 | Ht 66.0 in | Wt 168.0 lb

## 2018-12-13 DIAGNOSIS — Z0000 Encounter for general adult medical examination without abnormal findings: Secondary | ICD-10-CM

## 2018-12-13 NOTE — Progress Notes (Signed)
Subjective:     Michaela Dodson is a 46 y.o. female and is here for a comprehensive physical exam. The patient reports no problems.  Social History   Socioeconomic History  . Marital status: Married    Spouse name: Not on file  . Number of children: Not on file  . Years of education: Not on file  . Highest education level: Not on file  Occupational History  . Not on file  Social Needs  . Financial resource strain: Not on file  . Food insecurity:    Worry: Not on file    Inability: Not on file  . Transportation needs:    Medical: Not on file    Non-medical: Not on file  Tobacco Use  . Smoking status: Former Smoker    Types: Cigarettes    Last attempt to quit: 07/18/2015    Years since quitting: 3.4  . Smokeless tobacco: Never Used  . Tobacco comment: e-cigs  Substance and Sexual Activity  . Alcohol use: Yes  . Drug use: No  . Sexual activity: Yes    Birth control/protection: None  Lifestyle  . Physical activity:    Days per week: Not on file    Minutes per session: Not on file  . Stress: Not on file  Relationships  . Social connections:    Talks on phone: Not on file    Gets together: Not on file    Attends religious service: Not on file    Active member of club or organization: Not on file    Attends meetings of clubs or organizations: Not on file    Relationship status: Not on file  . Intimate partner violence:    Fear of current or ex partner: Not on file    Emotionally abused: Not on file    Physically abused: Not on file    Forced sexual activity: Not on file  Other Topics Concern  . Not on file  Social History Narrative  . Not on file   Health Maintenance  Topic Date Due  . PAP SMEAR-Modifier  03/05/2018  . INFLUENZA VACCINE  03/28/2019 (Originally 07/28/2018)  . TETANUS/TDAP  09/07/2019 (Originally 12/29/2015)  . HIV Screening  Completed    The following portions of the patient's history were reviewed and updated as appropriate: allergies, current  medications, past family history, past medical history, past social history, past surgical history and problem list.  Review of Systems A comprehensive review of systems was negative.   Objective:    BP 115/60   Pulse 86   Ht 5\' 6"  (1.676 m)   Wt 168 lb (76.2 kg)   SpO2 100%   BMI 27.12 kg/m  General appearance: alert, cooperative and appears stated age Head: Normocephalic, without obvious abnormality, atraumatic Eyes: conj clear, EOMI, PEERLA Ears: normal TM's and external ear canals both ears Nose: Nares normal. Septum midline. Mucosa normal. No drainage or sinus tenderness. Throat: lips, mucosa, and tongue normal; teeth and gums normal Neck: no adenopathy, no carotid bruit, no JVD, supple, symmetrical, trachea midline and thyroid not enlarged, symmetric, no tenderness/mass/nodules Back: symmetric, no curvature. ROM normal. No CVA tenderness. Lungs: clear to auscultation bilaterally Heart: regular rate and rhythm, S1, S2 normal, no murmur, click, rub or gallop Abdomen: soft, non-tender; bowel sounds normal; no masses,  no organomegaly Extremities: extremities normal, atraumatic, no cyanosis or edema Pulses: 2+ and symmetric Skin: Skin color, texture, turgor normal. No rashes or lesions Lymph nodes: Cervical, supraclavicular, and axillary nodes normal.  Neurologic: Alert and oriented X 3, normal strength and tone. Normal symmetric reflexes. Normal coordination and gait    Assessment:    Healthy female exam.      Plan:     See After Visit Summary for Counseling Recommendations   Keep up a regular exercise program and make sure you are eating a healthy diet Try to eat 4 servings of dairy a day, or if you are lactose intolerant take a calcium with vitamin D daily.  Your vaccines are up to date.  Labs ordered. She want to schedule her pap next month.   Declined flu vaccine.

## 2018-12-13 NOTE — Patient Instructions (Signed)

## 2018-12-14 ENCOUNTER — Encounter: Payer: Self-pay | Admitting: Sports Medicine

## 2018-12-14 ENCOUNTER — Telehealth: Payer: Self-pay | Admitting: Family Medicine

## 2018-12-14 DIAGNOSIS — F3110 Bipolar disorder, current episode manic without psychotic features, unspecified: Secondary | ICD-10-CM | POA: Diagnosis not present

## 2018-12-14 MED ORDER — PREDNISONE 50 MG PO TABS: ORAL_TABLET | ORAL | 0 refills | Status: DC

## 2018-12-14 NOTE — Telephone Encounter (Signed)
My chart note sent.

## 2019-01-02 DIAGNOSIS — Z0000 Encounter for general adult medical examination without abnormal findings: Secondary | ICD-10-CM | POA: Diagnosis not present

## 2019-01-03 LAB — COMPLETE METABOLIC PANEL WITH GFR
AG RATIO: 2.4 (calc) (ref 1.0–2.5)
ALT: 17 U/L (ref 6–29)
AST: 17 U/L (ref 10–35)
Albumin: 4.6 g/dL (ref 3.6–5.1)
Alkaline phosphatase (APISO): 34 U/L (ref 33–115)
BILIRUBIN TOTAL: 2.2 mg/dL — AB (ref 0.2–1.2)
BUN: 13 mg/dL (ref 7–25)
CALCIUM: 9.6 mg/dL (ref 8.6–10.2)
CO2: 27 mmol/L (ref 20–32)
Chloride: 104 mmol/L (ref 98–110)
Creat: 0.79 mg/dL (ref 0.50–1.10)
GFR, EST AFRICAN AMERICAN: 103 mL/min/{1.73_m2} (ref >=60)
GFR, EST NON AFRICAN AMERICAN: 89 mL/min/{1.73_m2} (ref >=60)
GLUCOSE: 96 mg/dL (ref 65–99)
Globulin: 1.9 g/dL (calc) (ref 1.9–3.7)
POTASSIUM: 4.2 mmol/L (ref 3.5–5.3)
Sodium: 140 mmol/L (ref 135–146)
TOTAL PROTEIN: 6.5 g/dL (ref 6.1–8.1)

## 2019-01-03 LAB — CBC
HEMATOCRIT: 37.7 % (ref 35.0–45.0)
HEMOGLOBIN: 13.1 g/dL (ref 11.7–15.5)
MCH: 33 pg (ref 27.0–33.0)
MCHC: 34.7 g/dL (ref 32.0–36.0)
MCV: 95 fL (ref 80.0–100.0)
MPV: 10.9 fL (ref 7.5–12.5)
Platelets: 208 10*3/uL (ref 140–400)
RBC: 3.97 10*6/uL (ref 3.80–5.10)
RDW: 12 % (ref 11.0–15.0)
WBC: 4.6 10*3/uL (ref 3.8–10.8)

## 2019-01-03 LAB — LIPID PANEL
Cholesterol: 209 mg/dL — ABNORMAL HIGH (ref <200)
HDL: 78 mg/dL (ref >50)
LDL Cholesterol (Calc): 110 mg/dL (calc) — ABNORMAL HIGH
Non-HDL Cholesterol (Calc): 131 mg/dL (calc) — ABNORMAL HIGH (ref <130)
Total CHOL/HDL Ratio: 2.7 (calc) (ref <5.0)
Triglycerides: 104 mg/dL (ref <150)

## 2019-01-27 ENCOUNTER — Other Ambulatory Visit: Payer: Self-pay | Admitting: *Deleted

## 2019-01-27 MED ORDER — TRIMETHOPRIM 100 MG PO TABS: 100.0000 mg | ORAL_TABLET | Freq: Every day | ORAL | 3 refills | Status: DC

## 2019-01-27 NOTE — Telephone Encounter (Signed)
Received fax for refills for pt for the following refills:  Lamictal, risperdal, trintellix, buspirone, and trimethoprim. Checked pt's medication history and Dr. Madilyn Fireman ONLY prescribes for trimethoprim, all the others she gets from psychiatry . Was advised that she will need to get her refills from them.Maryruth Eve, Lahoma Crocker, CMA

## 2019-02-17 ENCOUNTER — Other Ambulatory Visit: Payer: Self-pay | Admitting: Family Medicine

## 2019-06-06 DIAGNOSIS — F419 Anxiety disorder, unspecified: Secondary | ICD-10-CM | POA: Diagnosis not present

## 2019-06-06 DIAGNOSIS — F3181 Bipolar II disorder: Secondary | ICD-10-CM | POA: Diagnosis not present

## 2019-06-14 DIAGNOSIS — F3181 Bipolar II disorder: Secondary | ICD-10-CM | POA: Diagnosis not present

## 2019-06-20 DIAGNOSIS — Z20828 Contact with and (suspected) exposure to other viral communicable diseases: Secondary | ICD-10-CM | POA: Diagnosis not present

## 2019-06-20 DIAGNOSIS — R079 Chest pain, unspecified: Secondary | ICD-10-CM | POA: Diagnosis not present

## 2019-06-20 DIAGNOSIS — R509 Fever, unspecified: Secondary | ICD-10-CM | POA: Diagnosis not present

## 2019-06-20 DIAGNOSIS — R0602 Shortness of breath: Secondary | ICD-10-CM | POA: Diagnosis not present

## 2019-06-20 DIAGNOSIS — R0600 Dyspnea, unspecified: Secondary | ICD-10-CM | POA: Diagnosis not present

## 2019-06-20 DIAGNOSIS — E785 Hyperlipidemia, unspecified: Secondary | ICD-10-CM | POA: Diagnosis not present

## 2019-06-20 DIAGNOSIS — R072 Precordial pain: Secondary | ICD-10-CM | POA: Diagnosis not present

## 2019-06-20 DIAGNOSIS — U070 Vaping-related disorder: Secondary | ICD-10-CM | POA: Diagnosis not present

## 2019-06-20 DIAGNOSIS — F17210 Nicotine dependence, cigarettes, uncomplicated: Secondary | ICD-10-CM | POA: Diagnosis not present

## 2019-06-26 DIAGNOSIS — F3110 Bipolar disorder, current episode manic without psychotic features, unspecified: Secondary | ICD-10-CM | POA: Diagnosis not present

## 2019-06-29 ENCOUNTER — Encounter: Payer: Self-pay | Admitting: Family Medicine

## 2019-07-10 ENCOUNTER — Encounter: Payer: Self-pay | Admitting: Family Medicine

## 2019-07-24 MED ORDER — NITROFURANTOIN MONOHYD MACRO 100 MG PO CAPS: 100.0000 mg | ORAL_CAPSULE | Freq: Two times a day (BID) | ORAL | 0 refills | Status: DC

## 2019-08-04 DIAGNOSIS — Z903 Acquired absence of stomach [part of]: Secondary | ICD-10-CM | POA: Diagnosis not present

## 2019-08-04 DIAGNOSIS — K912 Postsurgical malabsorption, not elsewhere classified: Secondary | ICD-10-CM | POA: Diagnosis not present

## 2019-08-17 ENCOUNTER — Other Ambulatory Visit: Payer: Self-pay

## 2019-08-17 ENCOUNTER — Ambulatory Visit (INDEPENDENT_AMBULATORY_CARE_PROVIDER_SITE_OTHER): Payer: BC Managed Care – PPO | Admitting: Family Medicine

## 2019-08-17 ENCOUNTER — Other Ambulatory Visit (HOSPITAL_COMMUNITY)
Admission: RE | Admit: 2019-08-17 | Discharge: 2019-08-17 | Disposition: A | Discharge status: Home / Self Care | Payer: BC Managed Care – PPO | Source: Ambulatory Visit | Attending: Family Medicine | Admitting: Family Medicine

## 2019-08-17 ENCOUNTER — Encounter: Payer: Self-pay | Admitting: Family Medicine

## 2019-08-17 VITALS — BP 96/58 | HR 79 | Ht 66.0 in | Wt 159.0 lb

## 2019-08-17 DIAGNOSIS — Z124 Encounter for screening for malignant neoplasm of cervix: Secondary | ICD-10-CM

## 2019-08-17 NOTE — Progress Notes (Signed)
Established Patient Office Visit  Subjective:  Patient ID: Michaela Dodson, female    DOB: 01-02-1971  Age: 48 y.o. MRN: [id number]  CC:  Chief Complaint  Patient presents with  . Gynecologic Exam    HPI Michaela Dodson presents for for Pap smear only.  She actually came in for her physical back in December but we did not do her Pap smear at that time.  She denies any problems or pain.  Past Medical History:  Diagnosis Date  . Allergy   . Depression   . GERD (gastroesophageal reflux disease)    Pt states that this has gone away since her recent weight loss  . H/O thromboembolism    superficial-left medial thigh lipoma, vericose veins  . Obesity    Pt has lost the weight and is no longer obese    Past Surgical History:  Procedure Laterality Date  . BREAST BIOPSY    . CARPAL TUNNEL RELEASE Right 09/2012   Dr. Apolonio Schneiders  . CARPAL TUNNEL RELEASE Left 11/2013   Dr. Apolonio Schneiders  . TUBAL LIGATION    . uterine ablation      Family History  Problem Relation Age of Onset  . Breast cancer Mother   . Bladder Cancer Mother   . Colon cancer Maternal Grandmother     Social History   Socioeconomic History  . Marital status: Married    Spouse name: Not on file  . Number of children: Not on file  . Years of education: Not on file  . Highest education level: Not on file  Occupational History  . Not on file  Social Needs  . Financial resource strain: Not on file  . Food insecurity    Worry: Not on file    Inability: Not on file  . Transportation needs    Medical: Not on file    Non-medical: Not on file  Tobacco Use  . Smoking status: Former Smoker    Types: Cigarettes    Quit date: 07/18/2015    Years since quitting: 4.0  . Smokeless tobacco: Never Used  . Tobacco comment: e-cigs  Substance and Sexual Activity  . Alcohol use: Yes  . Drug use: No  . Sexual activity: Yes    Birth control/protection: None  Lifestyle  . Physical activity    Days per week: Not on file     Minutes per session: Not on file  . Stress: Not on file  Relationships  . Social Herbalist on phone: Not on file    Gets together: Not on file    Attends religious service: Not on file    Active member of club or organization: Not on file    Attends meetings of clubs or organizations: Not on file    Relationship status: Not on file  . Intimate partner violence    Fear of current or ex partner: Not on file    Emotionally abused: Not on file    Physically abused: Not on file    Forced sexual activity: Not on file  Other Topics Concern  . Not on file  Social History Narrative  . Not on file    Outpatient Medications Prior to Visit  Medication Sig Dispense Refill  . busPIRone (BUSPAR) 10 MG tablet Take 10 mg by mouth 2 (two) times daily.  2  . Cyanocobalamin (B-12 PO) Take by mouth.    Marland Kitchen LAMICTAL 150 MG tablet Take 150 mg by mouth daily.  2  .  risperiDONE (RISPERDAL) 3 MG tablet Take 1 tablet by mouth daily.    Marland Kitchen trimethoprim (TRIMPEX) 100 MG tablet Take 1 tablet (100 mg total) by mouth daily. 90 tablet 3  . VYVANSE 60 MG CHEW Chew 1 tablet by mouth daily.    . nitrofurantoin, macrocrystal-monohydrate, (MACROBID) 100 MG capsule Take 1 capsule (100 mg total) by mouth 2 (two) times daily. 10 capsule 0  . Probiotic Product (PROBIOTIC DAILY PO) Take by mouth.    Marland Kitchen RISPERDAL 2 MG tablet Take 0.5-1 tablets by mouth 2 (two) times daily. Take 1/2 tab by mouth in AM and one tab at bedtime    . TRINTELLIX 10 MG TABS tablet Take 10 mg by mouth daily.     No facility-administered medications prior to visit.     No Known Allergies  ROS Review of Systems    Objective:    Physical Exam  Constitutional: She is oriented to person, place, and time. She appears well-developed and well-nourished.  HENT:  Head: Normocephalic and atraumatic.  Eyes: Conjunctivae and EOM are normal.  Cardiovascular: Normal rate.  Pulmonary/Chest: Effort normal.  Genitourinary:    Vagina normal.   There is no rash on the right labia. There is no rash on the left labia.    No vaginal discharge, erythema, tenderness or bleeding.  No erythema, tenderness or bleeding in the vagina.    No foreign body in the vagina.     No signs of injury in the vagina.   Neurological: She is alert and oriented to person, place, and time.  Skin: Skin is dry. No pallor.  Psychiatric: She has a normal mood and affect. Her behavior is normal.  Vitals reviewed.   BP (!) 96/58   Pulse 79   Ht 5\' 6"  (1.676 m)   Wt 159 lb (72.1 kg)   SpO2 98%   BMI 25.66 kg/m  Wt Readings from Last 3 Encounters:  08/17/19 159 lb (72.1 kg)  12/13/18 168 lb (76.2 kg)  09/06/18 168 lb (76.2 kg)     Health Maintenance Due  Topic Date Due  . PAP SMEAR-Modifier  03/05/2018    There are no preventive care reminders to display for this patient.  Lab Results  Component Value Date   TSH 1.19 09/06/2018   Lab Results  Component Value Date   WBC 4.6 01/02/2019   HGB 13.1 01/02/2019   HCT 37.7 01/02/2019   MCV 95.0 01/02/2019   PLT 208 01/02/2019   Lab Results  Component Value Date   NA 140 01/02/2019   K 4.2 01/02/2019   CO2 27 01/02/2019   GLUCOSE 96 01/02/2019   BUN 13 01/02/2019   CREATININE 0.79 01/02/2019   BILITOT 2.2 (H) 01/02/2019   ALKPHOS 49 03/29/2017   AST 17 01/02/2019   ALT 17 01/02/2019   PROT 6.5 01/02/2019   ALBUMIN 4.0 03/29/2017   CALCIUM 9.6 01/02/2019   Lab Results  Component Value Date   CHOL 209 (H) 01/02/2019   Lab Results  Component Value Date   HDL 78 01/02/2019   Lab Results  Component Value Date   LDLCALC 110 (H) 01/02/2019   Lab Results  Component Value Date   TRIG 104 01/02/2019   Lab Results  Component Value Date   CHOLHDL 2.7 01/02/2019   No results found for: HGBA1C    Assessment & Plan:   Problem List Items Addressed This Visit    None    Visit Diagnoses    Screening for  cervical cancer    -  Primary   Relevant Orders   Cytology - PAP      Smear performed.  Will call with results once available.  No orders of the defined types were placed in this encounter.   Follow-up: No follow-ups on file.    [name], MD

## 2019-08-18 DIAGNOSIS — F329 Major depressive disorder, single episode, unspecified: Secondary | ICD-10-CM | POA: Diagnosis not present

## 2019-08-18 DIAGNOSIS — G479 Sleep disorder, unspecified: Secondary | ICD-10-CM | POA: Diagnosis not present

## 2019-08-18 DIAGNOSIS — K219 Gastro-esophageal reflux disease without esophagitis: Secondary | ICD-10-CM | POA: Diagnosis not present

## 2019-08-18 DIAGNOSIS — Z903 Acquired absence of stomach [part of]: Secondary | ICD-10-CM | POA: Diagnosis not present

## 2019-08-18 DIAGNOSIS — K912 Postsurgical malabsorption, not elsewhere classified: Secondary | ICD-10-CM | POA: Diagnosis not present

## 2019-08-19 LAB — CYTOLOGY - PAP
Diagnosis: NEGATIVE
HPV: NOT DETECTED

## 2019-08-21 NOTE — Progress Notes (Signed)
Call patient: Your Pap smear is normal. Repeat in 5 years.

## 2019-08-30 ENCOUNTER — Other Ambulatory Visit: Payer: Self-pay | Admitting: Family Medicine

## 2019-09-14 ENCOUNTER — Ambulatory Visit: Payer: Self-pay

## 2019-09-14 ENCOUNTER — Encounter: Payer: Self-pay | Admitting: Family Medicine

## 2019-09-15 NOTE — Telephone Encounter (Signed)
She really needs to come in to be evaluated for UTI.  First things first really check the urine for abnormalities and infection before trying any kind medication for overactive bladder etc.  I agree, can see sports med for the arm issue.

## 2019-09-18 ENCOUNTER — Ambulatory Visit (INDEPENDENT_AMBULATORY_CARE_PROVIDER_SITE_OTHER): Payer: BC Managed Care – PPO

## 2019-09-18 ENCOUNTER — Ambulatory Visit (INDEPENDENT_AMBULATORY_CARE_PROVIDER_SITE_OTHER): Payer: BC Managed Care – PPO | Admitting: Family Medicine

## 2019-09-18 ENCOUNTER — Other Ambulatory Visit: Payer: Self-pay

## 2019-09-18 ENCOUNTER — Encounter: Payer: Self-pay | Admitting: Family Medicine

## 2019-09-18 ENCOUNTER — Encounter: Payer: Self-pay | Admitting: Sports Medicine

## 2019-09-18 ENCOUNTER — Ambulatory Visit (INDEPENDENT_AMBULATORY_CARE_PROVIDER_SITE_OTHER): Payer: BC Managed Care – PPO | Admitting: Sports Medicine

## 2019-09-18 VITALS — BP 117/68 | HR 76 | Ht 66.0 in | Wt 165.0 lb

## 2019-09-18 DIAGNOSIS — M19011 Primary osteoarthritis, right shoulder: Secondary | ICD-10-CM | POA: Insufficient documentation

## 2019-09-18 DIAGNOSIS — Z23 Encounter for immunization: Secondary | ICD-10-CM

## 2019-09-18 DIAGNOSIS — M7521 Bicipital tendinitis, right shoulder: Secondary | ICD-10-CM | POA: Diagnosis not present

## 2019-09-18 DIAGNOSIS — R3915 Urgency of urination: Secondary | ICD-10-CM | POA: Diagnosis not present

## 2019-09-18 DIAGNOSIS — M25511 Pain in right shoulder: Secondary | ICD-10-CM | POA: Diagnosis not present

## 2019-09-18 LAB — POCT URINALYSIS DIPSTICK
Bilirubin, UA: NEGATIVE
Glucose, UA: NEGATIVE
Ketones, UA: NEGATIVE
Leukocytes, UA: NEGATIVE
Nitrite, UA: POSITIVE
Protein, UA: NEGATIVE
Spec Grav, UA: 1.02 (ref 1.010–1.025)
Urobilinogen, UA: 0.2 E.U./dL
pH, UA: 6.5 (ref 5.0–8.0)

## 2019-09-18 MED ORDER — CEPHALEXIN 500 MG PO CAPS: 500.0000 mg | ORAL_CAPSULE | Freq: Three times a day (TID) | ORAL | 0 refills | Status: DC

## 2019-09-18 NOTE — Assessment & Plan Note (Signed)
Bicipital sheath injection, x-rays, formal physical therapy. Return to see me in 6 weeks.

## 2019-09-18 NOTE — Progress Notes (Signed)
Subjective:    CC: Right shoulder pain  HPI: For 2 months this pleasant 48 year old female has had pain in the right shoulder, worse with internal rotation, flexion, abduction.  Waking Dodson from sleep, severe, persistent, localized without radiation past the elbow.  I reviewed the past medical history, family history, social history, surgical history, and allergies today and no changes were needed.  Please see the problem list section below in epic for further details.  Past Medical History: Past Medical History:  Diagnosis Date  . Allergy   . Depression   . GERD (gastroesophageal reflux disease)    Pt states that this has gone away since Dodson recent weight loss  . H/O thromboembolism    superficial-left medial thigh lipoma, vericose veins  . Obesity    Pt has lost the weight and is no longer obese   Past Surgical History: Past Surgical History:  Procedure Laterality Date  . BREAST BIOPSY    . CARPAL TUNNEL RELEASE Right 09/2012   Dr. Apolonio Dodson  . CARPAL TUNNEL RELEASE Left 11/2013   Dr. Apolonio Dodson  . TUBAL LIGATION    . uterine ablation     Social History: Social History   Socioeconomic History  . Marital status: Married    Spouse name: Not on file  . Number of children: Not on file  . Years of education: Not on file  . Highest education level: Not on file  Occupational History  . Not on file  Social Needs  . Financial resource strain: Not on file  . Food insecurity    Worry: Not on file    Inability: Not on file  . Transportation needs    Medical: Not on file    Non-medical: Not on file  Tobacco Use  . Smoking status: Former Smoker    Types: Cigarettes    Quit date: 07/18/2015    Years since quitting: 4.1  . Smokeless tobacco: Never Used  . Tobacco comment: e-cigs  Substance and Sexual Activity  . Alcohol use: Yes  . Drug use: No  . Sexual activity: Yes    Birth control/protection: None  Lifestyle  . Physical activity    Days per week: Not on file   Minutes per session: Not on file  . Stress: Not on file  Relationships  . Social Herbalist on phone: Not on file    Gets together: Not on file    Attends religious service: Not on file    Active member of club or organization: Not on file    Attends meetings of clubs or organizations: Not on file    Relationship status: Not on file  Other Topics Concern  . Not on file  Social History Narrative  . Not on file   Family History: Family History  Problem Relation Age of Onset  . Breast cancer Mother   . Bladder Cancer Mother   . Colon cancer Maternal Grandmother    Allergies: No Known Allergies Medications: See med rec.  Review of Systems: No fevers, chills, night sweats, weight loss, chest pain, or shortness of breath.   Objective:    General: Well Developed, well nourished, and in no acute distress.  Neuro: Alert and oriented x3, extra-ocular muscles intact, sensation grossly intact.  HEENT: Normocephalic, atraumatic, pupils equal round reactive to light, neck supple, no masses, no lymphadenopathy, thyroid nonpalpable.  Skin: Warm and dry, no rashes. Cardiac: Regular rate and rhythm, no murmurs rubs or gallops, no lower extremity edema.  Respiratory: Clear to auscultation bilaterally. Not using accessory muscles, speaking in full sentences. Right shoulder: Inspection reveals no abnormalities, atrophy or asymmetry. Palpation is normal with no tenderness over AC joint or bicipital groove. ROM is full in all planes. Rotator cuff strength normal throughout. No signs of impingement with negative Neer and Hawkin's tests, empty can. Speeds and Yergason's tests both abnormal. No labral pathology noted with negative Obrien's, negative crank, negative clunk, and good stability. Normal scapular function observed. No painful arc and no drop arm sign. No apprehension sign  Procedure: Real-time Ultrasound Guided injection of the right bicipital sheath Device: GE Logiq E   Verbal informed consent obtained.  Time-out conducted.  Noted no overlying erythema, induration, or other signs of local infection.  Skin prepped in a sterile fashion.  Local anesthesia: Topical Ethyl chloride.  With sterile technique and under real time ultrasound guidance:  25-gauge needle advanced into the bicipital groove under the long head of the biceps tendon, taking care to avoid intratendinous injection I placed 1 cc Kenalog 40, 1 cc lidocaine, 1 cc bupivacaine in the sheath. Completed without difficulty  Pain immediately resolved suggesting accurate placement of the medication.  Advised to call if fevers/chills, erythema, induration, drainage, or persistent bleeding.  Images permanently stored and available for review in the ultrasound unit.  Impression: Technically successful ultrasound guided injection.  Impression and Recommendations:    Biceps tendinitis of right shoulder Bicipital sheath injection, x-rays, formal physical therapy. Return to see me in 6 weeks.   ___________________________________________ Michaela Dodson. Michaela Dodson, M.D., ABFM., CAQSM. Primary Care and Sports Medicine [facility]  Adjunct Professor of [facility] [employer] [facility] [employer]

## 2019-09-18 NOTE — Progress Notes (Signed)
Acute Office Visit  Subjective:    Patient ID: Michaela Dodson, female    DOB: 03/20/1971, 48 y.o.   MRN: [id number]  Chief Complaint  Patient presents with  . Urinary Urgency    HPI Patient is in today for urinary urgency and incontinence. She also tried her husband oxybutinin and that didn't help. No dysuria, fever, chills or sweats.   She has had to wear a pad.    No blood in the urine.  No other triggers.  Is been going on for about 2 to 3 weeks now.  She finally called after she had 2 incontinence episodes.  Past Medical History:  Diagnosis Date  . Allergy   . Depression   . GERD (gastroesophageal reflux disease)    Pt states that this has gone away since her recent weight loss  . H/O thromboembolism    superficial-left medial thigh lipoma, vericose veins  . Obesity    Pt has lost the weight and is no longer obese    Past Surgical History:  Procedure Laterality Date  . BREAST BIOPSY    . CARPAL TUNNEL RELEASE Right 09/2012   Dr. Apolonio Schneiders  . CARPAL TUNNEL RELEASE Left 11/2013   Dr. Apolonio Schneiders  . TUBAL LIGATION    . uterine ablation      Family History  Problem Relation Age of Onset  . Breast cancer Mother   . Bladder Cancer Mother   . Colon cancer Maternal Grandmother     Social History   Socioeconomic History  . Marital status: Married    Spouse name: Not on file  . Number of children: Not on file  . Years of education: Not on file  . Highest education level: Not on file  Occupational History  . Not on file  Social Needs  . Financial resource strain: Not on file  . Food insecurity    Worry: Not on file    Inability: Not on file  . Transportation needs    Medical: Not on file    Non-medical: Not on file  Tobacco Use  . Smoking status: Former Smoker    Types: Cigarettes    Quit date: 07/18/2015    Years since quitting: 4.1  . Smokeless tobacco: Never Used  . Tobacco comment: e-cigs  Substance and Sexual Activity  . Alcohol use: Yes  . Drug use: No   . Sexual activity: Yes    Birth control/protection: None  Lifestyle  . Physical activity    Days per week: Not on file    Minutes per session: Not on file  . Stress: Not on file  Relationships  . Social Herbalist on phone: Not on file    Gets together: Not on file    Attends religious service: Not on file    Active member of club or organization: Not on file    Attends meetings of clubs or organizations: Not on file    Relationship status: Not on file  . Intimate partner violence    Fear of current or ex partner: Not on file    Emotionally abused: Not on file    Physically abused: Not on file    Forced sexual activity: Not on file  Other Topics Concern  . Not on file  Social History Narrative  . Not on file    Outpatient Medications Prior to Visit  Medication Sig Dispense Refill  . Cyanocobalamin (B-12 PO) Take by mouth.    Marland Kitchen LAMICTAL  150 MG tablet Take 150 mg by mouth daily.  2  . risperiDONE (RISPERDAL) 3 MG tablet Take 1 tablet by mouth daily.    Marland Kitchen trimethoprim (TRIMPEX) 100 MG tablet TAKE 1 TABLET (100 MG TOTAL) DAILY BY MOUTH. 90 tablet 3  . VYVANSE 60 MG CHEW Chew 1 tablet by mouth daily.    . busPIRone (BUSPAR) 10 MG tablet Take 10 mg by mouth 2 (two) times daily.  2   No facility-administered medications prior to visit.     No Known Allergies  ROS     Objective:    Physical Exam  Constitutional: She is oriented to person, place, and time. She appears well-developed and well-nourished.  HENT:  Head: Normocephalic and atraumatic.  Eyes: Conjunctivae and EOM are normal.  Cardiovascular: Normal rate.  Pulmonary/Chest: Effort normal.  Abdominal: Soft. Bowel sounds are normal. She exhibits no distension and no mass. There is abdominal tenderness. There is no rebound and no guarding.  Mild tenderness in the left mid abdomen.  Musculoskeletal:     Comments: No CVA tenderness.  Neurological: She is alert and oriented to person, place, and time.   Skin: Skin is dry. No pallor.  Psychiatric: She has a normal mood and affect. Her behavior is normal.  Vitals reviewed.   BP 117/68   Pulse 76   Ht 5\' 6"  (1.676 m)   Wt 165 lb (74.8 kg)   SpO2 98%   BMI 26.63 kg/m  Wt Readings from Last 3 Encounters:  09/18/19 165 lb (74.8 kg)  08/17/19 159 lb (72.1 kg)  12/13/18 168 lb (76.2 kg)    There are no preventive care reminders to display for this patient.  There are no preventive care reminders to display for this patient.   Lab Results  Component Value Date   TSH 1.19 09/06/2018   Lab Results  Component Value Date   WBC 4.6 01/02/2019   HGB 13.1 01/02/2019   HCT 37.7 01/02/2019   MCV 95.0 01/02/2019   PLT 208 01/02/2019   Lab Results  Component Value Date   NA 140 01/02/2019   K 4.2 01/02/2019   CO2 27 01/02/2019   GLUCOSE 96 01/02/2019   BUN 13 01/02/2019   CREATININE 0.79 01/02/2019   BILITOT 2.2 (H) 01/02/2019   ALKPHOS 49 03/29/2017   AST 17 01/02/2019   ALT 17 01/02/2019   PROT 6.5 01/02/2019   ALBUMIN 4.0 03/29/2017   CALCIUM 9.6 01/02/2019   Lab Results  Component Value Date   CHOL 209 (H) 01/02/2019   Lab Results  Component Value Date   HDL 78 01/02/2019   Lab Results  Component Value Date   LDLCALC 110 (H) 01/02/2019   Lab Results  Component Value Date   TRIG 104 01/02/2019   Lab Results  Component Value Date   CHOLHDL 2.7 01/02/2019   No results found for: HGBA1C     Assessment & Plan:   Problem List Items Addressed This Visit    None    Visit Diagnoses    Urgency of urination    -  Primary   Relevant Orders   POCT urinalysis dipstick (Completed)   Need for tetanus, diphtheria, and acellular pertussis (Tdap) vaccine in patient of adolescent age or older       Relevant Orders   Tdap vaccine greater than or equal to 7yo IM (Completed)     Urinary urgency-urinalysis is positive for nitrites and blood.  We will go ahead and treat with  Keflex.  She has been on low-dose  trimethoprim for prophylaxis.  She will hold that medication while she takes the Keflex and then restart.  If not improved within 3 days then return and we will do a urinalysis with culture, and consider possible referral to urology.  Meds ordered this encounter  Medications  . cephALEXin (KEFLEX) 500 MG capsule    Sig: Take 1 capsule (500 mg total) by mouth 3 (three) times daily.    Dispense:  10 capsule    Refill:  0     [name], MD

## 2019-09-20 ENCOUNTER — Ambulatory Visit (INDEPENDENT_AMBULATORY_CARE_PROVIDER_SITE_OTHER): Payer: BC Managed Care – PPO | Admitting: Physical Therapy

## 2019-09-20 ENCOUNTER — Encounter: Payer: Self-pay | Admitting: Physical Therapy

## 2019-09-20 ENCOUNTER — Other Ambulatory Visit: Payer: Self-pay

## 2019-09-20 DIAGNOSIS — M25511 Pain in right shoulder: Secondary | ICD-10-CM | POA: Diagnosis not present

## 2019-09-20 DIAGNOSIS — M6281 Muscle weakness (generalized): Secondary | ICD-10-CM

## 2019-09-20 DIAGNOSIS — R293 Abnormal posture: Secondary | ICD-10-CM | POA: Diagnosis not present

## 2019-09-20 NOTE — Therapy (Signed)
[facility] [place], [place], [address] Phone: [phone]   Fax:  [phone]  Physical Therapy Evaluation  Patient Details  Name: Michaela Dodson MRN: [id number] Date of Birth: 1971-08-05 Referring Provider (PT): Silverio Decamp, MD   Encounter Date: 09/20/2019  PT End of Session - 09/20/19 1110    Visit Number  1    Number of Visits  12    Date for PT Re-Evaluation  11/01/19    PT Start Time  0845    PT Stop Time  0925    PT Time Calculation (min)  40 min    Activity Tolerance  Patient tolerated treatment well    Behavior During Therapy  [facility] for tasks assessed/performed       Past Medical History:  Diagnosis Date  . Allergy   . Depression   . GERD (gastroesophageal reflux disease)    Pt states that this has gone away since her recent weight loss  . H/O thromboembolism    superficial-left medial thigh lipoma, vericose veins  . Obesity    Pt has lost the weight and is no longer obese    Past Surgical History:  Procedure Laterality Date  . BREAST BIOPSY    . CARPAL TUNNEL RELEASE Right 09/2012   Dr. Apolonio Schneiders  . CARPAL TUNNEL RELEASE Left 11/2013   Dr. Apolonio Schneiders  . TUBAL LIGATION    . uterine ablation      There were no vitals filed for this visit.   Subjective Assessment - 09/20/19 0849    Subjective  Pt is a 48 y/o female who presents to OPPT for Rt shoulder and upper arm pain without known injury.  Pt reports pain x 2 months, which correlates to her return to work.  Pt had injection on 09/18/19, with minimal relief.    Limitations  Lifting    Patient Stated Goals  improve pain    Currently in Pain?  Yes    Pain Score  0-No pain   up to 10/10 -subsides quickly   Pain Location  Shoulder    Pain Orientation  Right    Pain Descriptors / Indicators  Sharp;Aching;Dull   dull ache has resolved with injection   Pain Type  Acute pain    Pain Onset  More than a month ago    Pain Frequency   Intermittent    Aggravating Factors   internal rotation with abduction, overhead lifting    Pain Relieving Factors  injection, immobilization         [facility] PT Assessment - 09/20/19 0853      Assessment   Medical Diagnosis  M75.21 (ICD-10-CM) - Biceps tendinitis of right shoulder    Referring Provider (PT)  Silverio Decamp, MD    Onset Date/Surgical Date  --   2 months   Hand Dominance  Right    Next MD Visit  10/30/2019    Prior Therapy  none for shoulder      Precautions   Precautions  None      Restrictions   Weight Bearing Restrictions  No      Balance Screen   Has the patient fallen in the past 6 months  No    Has the patient had a decrease in activity level because of a fear of falling?   No    Is the patient reluctant to leave their home because of a fear of falling?   No  Home Environment   Living Environment  Private residence    Living Arrangements  Spouse/significant other;Children   5 y/o son   Additional Comments  has grandchildren (52, 61 month old) that she picks up and carries      Prior Function   Level of Independence  Independent    Vocation  Part time employment    Vocation Requirements  [employer] - stocks cards at stores; lifting boxes from shelf to cart; (didn't work from March until mid-June)    Leisure  spend time with grandchildren, go to [place]; no regular exercise recently      Cognition   Overall Cognitive Status  Within Functional Limits for tasks assessed      Observation/Other Assessments   Focus on Therapeutic Outcomes (FOTO)   54 (46% limited; predicted 29% limited)      Posture/Postural Control   Posture/Postural Control  Postural limitations    Postural Limitations  Rounded Shoulders;Forward head      ROM / Strength   AROM / PROM / Strength  AROM;Strength      AROM   AROM Assessment Site  Shoulder    Right/Left Shoulder  Right    Right Shoulder ABduction  158 Degrees   sitting with pain   Right Shoulder Internal  Rotation  --   FIR to T12 with pain     Strength   Strength Assessment Site  Shoulder;Elbow    Right/Left Shoulder  Right;Left    Right Shoulder Flexion  3+/5    Right Shoulder ABduction  3+/5    Right Shoulder Internal Rotation  4/5    Right Shoulder External Rotation  3+/5    Left Shoulder Flexion  4/5    Left Shoulder ABduction  4/5    Left Shoulder Internal Rotation  4/5    Left Shoulder External Rotation  4/5    Right/Left Elbow  Right;Left    Right Elbow Flexion  4/5    Right Elbow Extension  4/5    Left Elbow Flexion  5/5    Left Elbow Extension  5/5      Palpation   Palpation comment  tenderness proximal biceps tendon; trigger points noted in upper traps, infraspinatus and teres minor, as well as deltoids      Special Tests    Special Tests  Rotator Cuff Impingement    Rotator Cuff Impingment tests  Michel Bickers test      Hawkins-Kennedy test   Findings  Negative    Side  Right    Comments  mild pain with IR in abduction; no pain as moved into horizontal adduction                Objective measurements completed on examination: See above findings.      [facility] Adult PT Treatment/Exercise - 09/20/19 0853      Self-Care   Self-Care  Other Self-Care Comments    Other Self-Care Comments   verbally reviewed and PT demonstrated HEP; discussed breaks throughout the day to decrease repetition of activity; educated on DN and incorporation into POC      Modalities   Modalities  Iontophoresis      Iontophoresis   Type of Iontophoresis  Dexamethasone    Location  Rt proximal biceps    Dose  1.0 cc    Time  6 hour patch             PT Education - 09/20/19 1109    Education Details  HEP, ionto,  DN    Person(s) Educated  Patient    Methods  Explanation;Demonstration;Handout    Comprehension  Verbalized understanding;Returned demonstration;Need further instruction          PT Long Term Goals - 09/20/19 1113      PT LONG TERM GOAL #1   Title   independent with HEP    Status  New    Target Date  11/01/19      PT LONG TERM GOAL #2   Title  FOTO score improved to </= 29% limited for improved function    Status  New    Target Date  11/01/19      PT LONG TERM GOAL #3   Title  improve functional internal rotation to at least T8 for improved UB dressing    Status  New    Target Date  11/01/19      PT LONG TERM GOAL #4   Title  report pain < 4/10 for improved function    Status  New    Target Date  11/01/19      PT LONG TERM GOAL #5   Title  improve Rt shoulder strength to at least 4/5 for improved function    Status  New    Target Date  11/01/19             Plan - 09/20/19 1110    Clinical Impression Statement  Pt is a 48 y/o female who presents to OPPT for Rt shoulder pain.  Pt demonstrates poor postural awareness, decreased strength and mild ROM limitations with active trigger points affecting functional mobility.  Pt will benefit from PT to address deficits listed.    Personal Factors and Comorbidities  Profession    Examination-Activity Limitations  Reach Overhead;Carry;Caring for Others;Lift    Examination-Participation Restrictions  Other   work   Stability/Clinical Decision Making  Stable/Uncomplicated    Clinical Decision Making  Low    Rehab Potential  Good    PT Frequency  2x / week    PT Duration  6 weeks    PT Treatment/Interventions  ADLs/Self Care Home Management;Cryotherapy;Electrical Stimulation;Ultrasound;Moist Heat;Iontophoresis 4mg /ml Dexamethasone;Functional mobility training;Therapeutic activities;Therapeutic exercise;Patient/family education;Neuromuscular re-education;Manual techniques;Passive range of motion;Vasopneumatic Device;Taping;Dry needling    PT Next Visit Plan  review HEP, manual/modalities/DN PRN, continue posture exercises    PT Home Exercise Plan  Access Code: [email or web address]       Patient will benefit from skilled therapeutic intervention in order to improve the following deficits  and impairments:  Increased fascial restricitons, Increased muscle spasms, Pain, Postural dysfunction, Decreased strength, Decreased range of motion  Visit Diagnosis: Acute pain of right shoulder - Plan: PT plan of care cert/re-cert  Abnormal posture - Plan: PT plan of care cert/re-cert  Muscle weakness (generalized) - Plan: PT plan of care cert/re-cert     Problem List Patient Active Problem List   Diagnosis Date Noted  . Biceps tendinitis of right shoulder 09/18/2019  . Family history of breast cancer in mother 09/06/2018  . Varicose veins of left leg with edema 06/06/2018  . Hyperbilirubinemia, suspect [name] Syndrome 09/16/2017  . Postgastrectomy malabsorption 09/16/2017  . Lipomatosis 04/27/2017  . Bipolar 1 disorder, depressed, moderate ([place]) 03/29/2017  . Primary osteoarthritis of both knees 10/14/2016  . History of sleeve gastrectomy 09/18/2015  . Left cervical radiculopathy 09/16/2015  . Acute superficial venous thrombosis of left lower extremity 09/16/2015  . Former smoker 07/03/2015  . Acid reflux 04/11/2015  . H/O renal calculi 04/11/2015  . Hypercholesterolemia without  hypertriglyceridemia 04/11/2015  . Disordered sleep 04/11/2015  . Snores 04/11/2015  . Lumbar facet arthropathy 03/06/2015  . Microscopic hematuria 06/06/2013  . Carpal tunnel syndrome 04/04/2012  . URINARY INCONTINENCE, URGE 09/24/2008  . ASTHMA, INTERMITTENT, MILD 02/06/2008  . DEPRESSION, SEVERE 08/25/2007      Laureen Abrahams, PT, DPT 09/20/19 11:16 AM     [facility] [place], [place], [address] Phone: [phone]   Fax:  [phone]  Name: KEANAH SAFER MRN: [id number] Date of Birth: February 16, 1971

## 2019-09-20 NOTE — Patient Instructions (Addendum)
IONTOPHORESIS PATIENT PRECAUTIONS & CONTRAINDICATIONS:  . Redness under one or both electrodes can occur.  This characterized by a uniform redness that usually disappears within 12 hours of treatment. . Small pinhead size blisters may result in response to the drug.  Contact your physician if the problem persists more than 24 hours. . On rare occasions, iontophoresis therapy can result in temporary skin reactions such as rash, inflammation, irritation or burns.  The skin reactions may be the result of individual sensitivity to the ionic solution used, the condition of the skin at the start of treatment, reaction to the materials in the electrodes, allergies or sensitivity to dexamethasone, or a poor connection between the patch and your skin.  Discontinue using iontophoresis if you have any of these reactions and report to your therapist. . Remove the Patch or electrodes if you have any undue sensation of pain or burning during the treatment and report discomfort to your therapist. . Tell your Therapist if you have had known adverse reactions to the application of electrical current. . If using the Patch, the LED light will turn off when treatment is complete and the patch can be removed.  Approximate treatment time is 1-3 hours.  Remove the patch when light goes off or after 6 hours. . The Patch can be worn during normal activity, however excessive motion where the electrodes have been placed can cause poor contact between the skin and the electrode or uneven electrical current resulting in greater risk of skin irritation. Marland Kitchen Keep out of the reach of children.   . DO NOT use if you have a cardiac pacemaker or any other electrically sensitive implanted device. . DO NOT use if you have a known sensitivity to dexamethasone. . DO NOT use during Magnetic Resonance Imaging (MRI). . DO NOT use over broken or compromised skin (e.g. sunburn, cuts, or acne) due to the increased risk of skin reaction. . DO  NOT SHAVE over the area to be treated:  To establish good contact between the Patch and the skin, excessive hair may be clipped. . DO NOT place the Patch or electrodes on or over your eyes, directly over your heart, or brain. . DO NOT reuse the Patch or electrodes as this may cause burns to occur. Access Code: [email or web address]  URL: [email or web address]  Date: 09/20/2019  Prepared by: Faustino Congress   Exercises  Seated Scapular Retraction - 10 reps - 1 sets - 5 sec hold - 2x daily - 7x weekly  Standing Backward Shoulder Rolls - 10 reps - 1 sets - 2x daily - 7x weekly  Doorway Pec Stretch at 90 Degrees Abduction - 3 reps - 1 sets - 30 sec hold - 1x daily - 7x weekly  Patient Education  Trigger Point Dry Needling

## 2019-09-26 ENCOUNTER — Ambulatory Visit (INDEPENDENT_AMBULATORY_CARE_PROVIDER_SITE_OTHER): Payer: BC Managed Care – PPO | Admitting: Physical Therapy

## 2019-09-26 ENCOUNTER — Other Ambulatory Visit: Payer: Self-pay

## 2019-09-26 ENCOUNTER — Encounter: Payer: BC Managed Care – PPO | Admitting: Physical Therapy

## 2019-09-26 DIAGNOSIS — M6281 Muscle weakness (generalized): Secondary | ICD-10-CM

## 2019-09-26 DIAGNOSIS — M25511 Pain in right shoulder: Secondary | ICD-10-CM

## 2019-09-26 DIAGNOSIS — R293 Abnormal posture: Secondary | ICD-10-CM | POA: Diagnosis not present

## 2019-09-26 NOTE — Patient Instructions (Signed)
Access Code: [email or web address]  URL: [email or web address]  Date: 09/26/2019  Prepared by: Almyra Free Laurelin Elson   Exercises Seated Scapular Retraction - 10 reps - 1 sets - 5 sec hold - 2x daily - 7x weekly Standing Backward Shoulder Rolls - 10 reps - 1 sets - 2x daily - 7x weekly Doorway Pec Stretch at 90 Degrees Abduction - 3 reps - 1 sets - 30 sec hold - 1x daily - 7x weekly Isometric Shoulder Flexion at Wall - 10 reps - 3 sets - 1x daily - 7x weekly Standing Isometric Shoulder Internal Rotation at Doorway - 10 reps - 3 sets - 1x daily - 7x weekly Isometric Shoulder External Rotation at Wall - 10 reps - 3 sets - 1x daily - 7x weekly Isometric Shoulder Abduction at Wall - 10 reps - 3 sets - 1x daily - 7x weekly Isometric Shoulder Extension at Wall - 10 reps - 3 sets - 1x daily - 7x weekly Patient Education Trigger Point Dry Needling

## 2019-09-26 NOTE — Therapy (Signed)
[place], [place], [address] Phone: [phone]   Fax:  [phone]  Physical Therapy Treatment  Patient Details  Name: Michaela Dodson MRN: [id number] Date of Birth: [birth year] Referring Provider (PT): Silverio Decamp, MD   Encounter Date: 09/26/2019  PT End of Session - 09/26/19 1101    Visit Number  2    Number of Visits  12    Date for PT Re-Evaluation  11/01/19    PT Start Time  1102    PT Stop Time  1145    PT Time Calculation (min)  43 min    Activity Tolerance  Patient tolerated treatment well    Behavior During Therapy  [facility] for tasks assessed/performed       Past Medical History:  Diagnosis Date  . Allergy   . Depression   . GERD (gastroesophageal reflux disease)    Pt states that this has gone away since her recent weight loss  . H/O thromboembolism    superficial-left medial thigh lipoma, vericose veins  . Obesity    Pt has lost the weight and is no longer obese    Past Surgical History:  Procedure Laterality Date  . BREAST BIOPSY    . CARPAL TUNNEL RELEASE Right 09/2012   Dr. Apolonio Schneiders  . CARPAL TUNNEL RELEASE Left 11/2013   Dr. Apolonio Schneiders  . TUBAL LIGATION    . uterine ablation      There were no vitals filed for this visit.  Subjective Assessment - 09/26/19 1103    Subjective  The patch really helped but pain is stil there.    Patient Stated Goals  improve pain    Currently in Pain?  Yes    Pain Score  8     Pain Location  Shoulder    Pain Orientation  Right    Pain Descriptors / Indicators  Aching    Pain Type  Acute pain                       [facility] Adult PT Treatment/Exercise - 09/26/19 0001      Exercises   Exercises  Shoulder      Shoulder Exercises: Standing   Retraction  Both;10 reps      Shoulder Exercises: Pulleys   Flexion  1 minute    Scaption  1 minute      Shoulder Exercises: Isometric Strengthening   Flexion  5X10"    Extension  5X10"    External Rotation  5X10"    Internal Rotation  5X10"    ABduction  5X10"    ABduction Limitations  became painful    Other Isometric Exercises  --      Shoulder Exercises: Stretch   Corner Stretch Limitations  unable to tolerate doorway stretch bil or unilaterally      Modalities   Modalities  Iontophoresis      Iontophoresis   Type of Iontophoresis  Dexamethasone    Location  Rt proximal biceps    Dose  1.0 cc    Time  4 hour patch      Manual Therapy   Manual Therapy  Soft tissue mobilization;Joint mobilization;Passive ROM    Joint Mobilization  thoracic PA mobs WNL    Soft tissue mobilization  to Right UT, levator, deltoids, pecs, lats, IS,Teres major    Passive ROM  into flexion       Trigger Point Dry Needling - 09/26/19  0001    Consent Given?  Yes    Education Handout Provided  Yes    Muscles Treated Head and Neck  Upper trapezius;Levator scapulae    Muscles Treated Upper Quadrant  Pectoralis major;Infraspinatus;Deltoid;Latissimus dorsi;Teres major    Dry Needling Comments  right    Upper Trapezius Response  Twitch reponse elicited;Palpable increased muscle length    Levator Scapulae Response  Palpable increased muscle length    Pectoralis Major Response  Palpable increased muscle length    Infraspinatus Response  Twitch response elicited;Palpable increased muscle length    Deltoid Response  Twitch response elicited;Palpable increased muscle length    Latissimus dorsi Response  Twitch response elicited;Palpable increased muscle length    Teres major Response  Twitch response elicited;Palpable increased muscle length                PT Long Term Goals - 09/20/19 1113      PT LONG TERM GOAL #1   Title  independent with HEP    Status  New    Target Date  11/01/19      PT LONG TERM GOAL #2   Title  FOTO score improved to </= 29% limited for improved function    Status  New    Target Date  11/01/19      PT LONG TERM GOAL #3   Title   improve functional internal rotation to at least T8 for improved UB dressing    Status  New    Target Date  11/01/19      PT LONG TERM GOAL #4   Title  report pain < 4/10 for improved function    Status  New    Target Date  11/01/19      PT LONG TERM GOAL #5   Title  improve Rt shoulder strength to at least 4/5 for improved function    Status  New    Target Date  11/01/19            Plan - 09/26/19 1153    Clinical Impression Statement  Patient reported significant improvement after IE, but pain increased again to 8/10 today. She had some referred pain into right deltoid with pulleys and with isometric ABD and she did not tolerate doorway stretch. She responded very well to DN in right upper quadrant. Isometrics issued for HEP.    PT Treatment/Interventions  ADLs/Self Care Home Management;Cryotherapy;Electrical Stimulation;Ultrasound;Moist Heat;Iontophoresis 4mg /ml Dexamethasone;Functional mobility training;Therapeutic activities;Therapeutic exercise;Patient/family education;Neuromuscular re-education;Manual techniques;Passive range of motion;Vasopneumatic Device;Taping;Dry needling    PT Next Visit Plan  review HEP, manual/modalities/DN PRN, continue posture exercises    PT Home Exercise Plan  Access Code: [email or web address]       Patient will benefit from skilled therapeutic intervention in order to improve the following deficits and impairments:  Increased fascial restricitons, Increased muscle spasms, Pain, Postural dysfunction, Decreased strength, Decreased range of motion  Visit Diagnosis: Acute pain of right shoulder  Abnormal posture  Muscle weakness (generalized)     Problem List Patient Active Problem List   Diagnosis Date Noted  . Biceps tendinitis of right shoulder 09/18/2019  . Family history of breast cancer in mother 09/06/2018  . Varicose veins of left leg with edema 06/06/2018  . Hyperbilirubinemia, suspect [name] Syndrome 09/16/2017  . Postgastrectomy  malabsorption 09/16/2017  . Lipomatosis 04/27/2017  . Bipolar 1 disorder, depressed, moderate ([place]) 03/29/2017  . Primary osteoarthritis of both knees 10/14/2016  . History of sleeve gastrectomy 09/18/2015  . Left cervical radiculopathy 09/16/2015  .  Acute superficial venous thrombosis of left lower extremity 09/16/2015  . Former smoker 07/03/2015  . Acid reflux 04/11/2015  . H/O renal calculi 04/11/2015  . Hypercholesterolemia without hypertriglyceridemia 04/11/2015  . Disordered sleep 04/11/2015  . Snores 04/11/2015  . Lumbar facet arthropathy 03/06/2015  . Microscopic hematuria 06/06/2013  . Carpal tunnel syndrome 04/04/2012  . URINARY INCONTINENCE, URGE 09/24/2008  . ASTHMA, INTERMITTENT, MILD 02/06/2008  . DEPRESSION, SEVERE 08/25/2007    Madelyn Flavors PT 09/26/2019, 12:02 PM  [facility] [place], [place], [address] Phone: [phone]   Fax:  [phone]  Name: Michaela Dodson MRN: [id number] Date of Birth: [birth year]

## 2019-09-27 ENCOUNTER — Encounter: Payer: Self-pay | Admitting: Physical Therapy

## 2019-09-27 ENCOUNTER — Encounter: Payer: Self-pay | Admitting: Family Medicine

## 2019-09-27 ENCOUNTER — Ambulatory Visit (INDEPENDENT_AMBULATORY_CARE_PROVIDER_SITE_OTHER): Payer: BC Managed Care – PPO | Admitting: Physical Therapy

## 2019-09-27 DIAGNOSIS — M6281 Muscle weakness (generalized): Secondary | ICD-10-CM

## 2019-09-27 DIAGNOSIS — R3915 Urgency of urination: Secondary | ICD-10-CM

## 2019-09-27 DIAGNOSIS — N390 Urinary tract infection, site not specified: Secondary | ICD-10-CM

## 2019-09-27 DIAGNOSIS — R293 Abnormal posture: Secondary | ICD-10-CM

## 2019-09-27 DIAGNOSIS — M25511 Pain in right shoulder: Secondary | ICD-10-CM | POA: Diagnosis not present

## 2019-09-27 NOTE — Therapy (Signed)
[place], [place], [address] Phone: [phone]   Fax:  [phone]  Physical Therapy Treatment  Patient Details  Name: Michaela Dodson MRN: [id number] Date of Birth: 09-15-1971 Referring Provider (PT): Silverio Decamp, MD   Encounter Date: 09/27/2019  PT End of Session - 09/27/19 0850    Visit Number  3    Number of Visits  12    Date for PT Re-Evaluation  11/01/19    PT Start Time  0850    PT Stop Time  0930    PT Time Calculation (min)  40 min       Past Medical History:  Diagnosis Date  . Allergy   . Depression   . GERD (gastroesophageal reflux disease)    Pt states that this has gone away since her recent weight loss  . H/O thromboembolism    superficial-left medial thigh lipoma, vericose veins  . Obesity    Pt has lost the weight and is no longer obese    Past Surgical History:  Procedure Laterality Date  . BREAST BIOPSY    . CARPAL TUNNEL RELEASE Right 09/2012   Dr. Apolonio Schneiders  . CARPAL TUNNEL RELEASE Left 11/2013   Dr. Apolonio Schneiders  . TUBAL LIGATION    . uterine ablation      There were no vitals filed for this visit.  Subjective Assessment - 09/27/19 0854    Subjective  Pt reports the DN helped; no pain today. "It's been amazing"   Currently in Pain?  No/denies    Pain Score  0-No pain         [facility] PT Assessment - 09/27/19 0001      Assessment   Medical Diagnosis  M75.21 (ICD-10-CM) - Biceps tendinitis of right shoulder    Referring Provider (PT)  Silverio Decamp, MD    Onset Date/Surgical Date  --   2 months   Hand Dominance  Right    Next MD Visit  10/30/2019    Prior Therapy  none for shoulder      AROM   Right Shoulder ABduction  140 Degrees    Right Shoulder Internal Rotation  --   thumb to T9      [facility] Adult PT Treatment/Exercise - 09/27/19 0001      Shoulder Exercises: Supine   Other Supine Exercises  snow angels to tolerance x 5       Shoulder Exercises: Standing   Extension  AAROM;Both;10 reps   cane , then bicep stretch holding counter x 15 sec x 2 reps     Shoulder Exercises: Pulleys   Flexion  2 minutes    Scaption  2 minutes      Shoulder Exercises: Isometric Strengthening   Flexion  5X5"    Extension  5X5"    External Rotation  5X5"    Internal Rotation  5X5"    ABduction  5X5"      Shoulder Exercises: Stretch   Internal Rotation Stretch  5 reps   10 sec hold, strap behind back   Other Shoulder Stretches  unable to tolerate 3 position doorway stretch bil or unilaterally      Modalities   Modalities  Iontophoresis      Iontophoresis   Type of Iontophoresis  Dexamethasone    Location  Rt infraspinatus insertion    Dose  1.0 cc    Time  6 patch 80 mA stat patch  Manual Therapy   Soft tissue mobilization  IASTM to Rt ant/post shoulder to decrease fascial adhesions and improve ROM             PT Education - 09/27/19 0932    Education Details  HEP - added bicep stretch holding counter.    Person(s) Educated  Patient    Methods  Explanation    Comprehension  Verbalized understanding          PT Long Term Goals - 09/20/19 1113      PT LONG TERM GOAL #1   Title  independent with HEP    Status  New    Target Date  11/01/19      PT LONG TERM GOAL #2   Title  FOTO score improved to </= 29% limited for improved function    Status  New    Target Date  11/01/19      PT LONG TERM GOAL #3   Title  improve functional internal rotation to at least T8 for improved UB dressing    Status  New    Target Date  11/01/19      PT LONG TERM GOAL #4   Title  report pain < 4/10 for improved function    Status  New    Target Date  11/01/19      PT LONG TERM GOAL #5   Title  improve Rt shoulder strength to at least 4/5 for improved function    Status  New    Target Date  11/01/19            Plan - 09/27/19 0908    Clinical Impression Statement  Pt initially had pain with ER  isometric; resolved with respositioning/ upright posture.  No pain with pulleys today, however she is still unable to tolerate doorway stretch and snow angels.  Pt had positive response to DN last session.  Progressing well towards goals.    PT Treatment/Interventions  ADLs/Self Care Home Management;Cryotherapy;Electrical Stimulation;Ultrasound;Moist Heat;Iontophoresis 4mg /ml Dexamethasone;Functional mobility training;Therapeutic activities;Therapeutic exercise;Patient/family education;Neuromuscular re-education;Manual techniques;Passive range of motion;Vasopneumatic Device;Taping;Dry needling    PT Next Visit Plan  review HEP, manual/modalities/DN PRN, continue posture exercises.  trial of taping for postural awareness.    PT Home Exercise Plan  Access Code: [email or web address]       Patient will benefit from skilled therapeutic intervention in order to improve the following deficits and impairments:  Increased fascial restricitons, Increased muscle spasms, Pain, Postural dysfunction, Decreased strength, Decreased range of motion  Visit Diagnosis: Acute pain of right shoulder  Abnormal posture  Muscle weakness (generalized)     Problem List Patient Active Problem List   Diagnosis Date Noted  . Biceps tendinitis of right shoulder 09/18/2019  . Family history of breast cancer in mother 09/06/2018  . Varicose veins of left leg with edema 06/06/2018  . Hyperbilirubinemia, suspect [name] Syndrome 09/16/2017  . Postgastrectomy malabsorption 09/16/2017  . Lipomatosis 04/27/2017  . Bipolar 1 disorder, depressed, moderate ([place]) 03/29/2017  . Primary osteoarthritis of both knees 10/14/2016  . History of sleeve gastrectomy 09/18/2015  . Left cervical radiculopathy 09/16/2015  . Acute superficial venous thrombosis of left lower extremity 09/16/2015  . Former smoker 07/03/2015  . Acid reflux 04/11/2015  . H/O renal calculi 04/11/2015  . Hypercholesterolemia without hypertriglyceridemia 04/11/2015  .  Disordered sleep 04/11/2015  . Snores 04/11/2015  . Lumbar facet arthropathy 03/06/2015  . Microscopic hematuria 06/06/2013  . Carpal tunnel syndrome 04/04/2012  . URINARY INCONTINENCE, URGE 09/24/2008  .  ASTHMA, INTERMITTENT, MILD 02/06/2008  . DEPRESSION, SEVERE 08/25/2007   Kerin Perna, PTA 09/27/19 10:57 AM  [facility] [place], [place], [address] Phone: [phone]   Fax:  [phone]  Name: Michaela Dodson MRN: [id number] Date of Birth: [birth year]

## 2019-09-28 NOTE — Telephone Encounter (Signed)
Referral placed.

## 2019-10-03 ENCOUNTER — Other Ambulatory Visit: Payer: Self-pay

## 2019-10-03 ENCOUNTER — Ambulatory Visit (INDEPENDENT_AMBULATORY_CARE_PROVIDER_SITE_OTHER): Payer: BC Managed Care – PPO | Admitting: Physical Therapy

## 2019-10-03 ENCOUNTER — Encounter: Payer: Self-pay | Admitting: Physical Therapy

## 2019-10-03 DIAGNOSIS — M25511 Pain in right shoulder: Secondary | ICD-10-CM | POA: Diagnosis not present

## 2019-10-03 DIAGNOSIS — M6281 Muscle weakness (generalized): Secondary | ICD-10-CM | POA: Diagnosis not present

## 2019-10-03 DIAGNOSIS — R293 Abnormal posture: Secondary | ICD-10-CM | POA: Diagnosis not present

## 2019-10-03 NOTE — Therapy (Signed)
[place], [place], [address] Phone: [phone]   Fax:  [phone]  Physical Therapy Treatment  Patient Details  Name: Michaela Dodson MRN: [id number] Date of Birth: 1971-09-07 Referring Provider (PT): Silverio Decamp, MD   Encounter Date: 10/03/2019  PT End of Session - 10/03/19 1017    Visit Number  4    Number of Visits  12    Date for PT Re-Evaluation  11/01/19    PT Start Time  1017    PT Stop Time  1105    PT Time Calculation (min)  48 min    Activity Tolerance  Patient tolerated treatment well    Behavior During Therapy  [facility] for tasks assessed/performed       Past Medical History:  Diagnosis Date  . Allergy   . Depression   . GERD (gastroesophageal reflux disease)    Pt states that this has gone away since her recent weight loss  . H/O thromboembolism    superficial-left medial thigh lipoma, vericose veins  . Obesity    Pt has lost the weight and is no longer obese    Past Surgical History:  Procedure Laterality Date  . BREAST BIOPSY    . CARPAL TUNNEL RELEASE Right 09/2012   Dr. Apolonio Schneiders  . CARPAL TUNNEL RELEASE Left 11/2013   Dr. Apolonio Schneiders  . TUBAL LIGATION    . uterine ablation      There were no vitals filed for this visit.  Subjective Assessment - 10/03/19 1018    Subjective  Patient states her pain is nothing she can't deal with. Her shoulder is much much better.    Patient Stated Goals  improve pain    Currently in Pain?  Yes    Pain Score  1     Pain Location  Shoulder    Pain Orientation  Right    Pain Descriptors / Indicators  Aching         [facility] PT Assessment - 10/03/19 0001      Strength   Right Shoulder Flexion  4-/5    Right Shoulder Extension  4/5    Right Shoulder ABduction  5/5    Right Shoulder Internal Rotation  4/5    Right Shoulder External Rotation  5/5                   [facility] Adult PT Treatment/Exercise - 10/03/19  0001      Shoulder Exercises: Sidelying   External Rotation  Right;10 reps;Weights    External Rotation Weight (lbs)  2      Shoulder Exercises: Standing   Protraction  Right;20 reps;Theraband    Theraband Level (Shoulder Protraction)  Level 2 (Red)    Horizontal ABduction  Strengthening;Both;Theraband    Theraband Level (Shoulder Horizontal ABduction)  Level 2 (Red)    External Rotation  Right;Theraband;15 reps   with pain at 13   Theraband Level (Shoulder External Rotation)  Level 2 (Red)    External Rotation Limitations  bil ER with yellow band x 10    Internal Rotation  Right;20 reps;Theraband    Theraband Level (Shoulder Internal Rotation)  Level 2 (Red)    Flexion  Right;10 reps;Weights    Shoulder Flexion Weight (lbs)  2    ABduction  Right;10 reps;Weights    Shoulder ABduction Weight (lbs)  2    Row  Both;Theraband;10 reps    Theraband Level (Shoulder Row)  Level 2 (Red)  Row Limitations  instability with return    Other Standing Exercises  scaption 2# x 10      Shoulder Exercises: Pulleys   Flexion  2 minutes    Scaption  2 minutes      Shoulder Exercises: Stretch   Wall Stretch - Flexion  1 rep;20 seconds    Other Shoulder Stretches  sleeper stretch 2x 20 sec      Modalities   Modalities  Iontophoresis      Iontophoresis   Type of Iontophoresis  Dexamethasone    Location  ant shoulder right    Dose  1.0 cc    Time  4 hour patch             PT Education - 10/03/19 1109    Education Details  HEP    Person(s) Educated  Patient    Methods  Explanation;Demonstration;Handout    Comprehension  Verbalized understanding;Returned demonstration          PT Long Term Goals - 10/03/19 1018      PT LONG TERM GOAL #1   Title  independent with HEP    Status  Partially Met      PT LONG TERM GOAL #3   Title  improve functional internal rotation to at least T8 for improved UB dressing    Status  Achieved      PT LONG TERM GOAL #4   Title  report pain  < 4/10 for improved function    Status  Partially Met      PT LONG TERM GOAL #5   Title  improve Rt shoulder strength to at least 4/5 for improved function    Status  Partially Met            Plan - 10/03/19 1210    Clinical Impression Statement  Patient progressing well toward goals. Her strength has improved with MMT, but she demonstrates instability and weakness with certain shoulder exercises and develops pain with some as well. She requests to decrease to once per week, so HEP was progressed with instructions to do isometrics when she has pain and band exercises when she doesn't.    PT Treatment/Interventions  ADLs/Self Care Home Management;Cryotherapy;Electrical Stimulation;Ultrasound;Moist Heat;Iontophoresis 66m/ml Dexamethasone;Functional mobility training;Therapeutic activities;Therapeutic exercise;Patient/family education;Neuromuscular re-education;Manual techniques;Passive range of motion;Vasopneumatic Device;Taping;Dry needling    PT Next Visit Plan  review HEP, manual/modalities/DN PRN, continue posture exercises.    PT Home Exercise Plan  Access Code: [email or web address]   Consulted and Agree with Plan of Care  Patient       Patient will benefit from skilled therapeutic intervention in order to improve the following deficits and impairments:  Increased fascial restricitons, Increased muscle spasms, Pain, Postural dysfunction, Decreased strength, Decreased range of motion  Visit Diagnosis: Acute pain of right shoulder  Abnormal posture  Muscle weakness (generalized)     Problem List Patient Active Problem List   Diagnosis Date Noted  . Biceps tendinitis of right shoulder 09/18/2019  . Family history of breast cancer in mother 09/06/2018  . Varicose veins of left leg with edema 06/06/2018  . Hyperbilirubinemia, suspect GR[place]anna RandySyndrome 09/16/2017  . Postgastrectomy malabsorption 09/16/2017  . Lipomatosis 04/27/2017  . Bipolar 1 disorder, depressed, moderate ([place]  03/29/2017  . Primary osteoarthritis of both knees 10/14/2016  . History of sleeve gastrectomy 09/18/2015  . Left cervical radiculopathy 09/16/2015  . Acute superficial venous thrombosis of left lower extremity 09/16/2015  . Former smoker 07/03/2015  . Acid reflux 04/11/2015  .  H/O renal calculi 04/11/2015  . Hypercholesterolemia without hypertriglyceridemia 04/11/2015  . Disordered sleep 04/11/2015  . Snores 04/11/2015  . Lumbar facet arthropathy 03/06/2015  . Microscopic hematuria 06/06/2013  . Carpal tunnel syndrome 04/04/2012  . URINARY INCONTINENCE, URGE 09/24/2008  . ASTHMA, INTERMITTENT, MILD 02/06/2008  . DEPRESSION, SEVERE 08/25/2007    Madelyn Flavors PT 10/03/2019, 12:14 PM  [facility] [place], [place], [address] Phone: [phone]   Fax:  [phone]  Name: CHERYLANNE ARDELEAN MRN: [id number] Date of Birth: 22-Nov-1971

## 2019-10-03 NOTE — Patient Instructions (Signed)
Access Code: [email or web address]  URL: [email or web address]  Date: 10/03/2019  Prepared by: Almyra Free Ameliya Nicotra   Exercises Seated Scapular Retraction - 10 reps - 1 sets - 5 sec hold - 2x daily - 7x weekly Standing Backward Shoulder Rolls - 10 reps - 1 sets - 2x daily - 7x weekly Doorway Pec Stretch at 90 Degrees Abduction - 3 reps - 1 sets - 30 sec hold - 1x daily - 7x weekly Isometric Shoulder Flexion at Wall - 10 reps - 3 sets - 1x daily - 7x weekly Standing Isometric Shoulder Internal Rotation at Doorway - 10 reps - 3 sets - 1x daily - 7x weekly Isometric Shoulder External Rotation at Wall - 10 reps - 3 sets - 1x daily - 7x weekly Isometric Shoulder Abduction at Wall - 10 reps - 3 sets - 1x daily - 7x weekly Isometric Shoulder Extension at Wall - 10 reps - 3 sets - 1x daily - 7x weekly Shoulder External Rotation and Scapular Retraction with Resistance - 10 reps - 3 sets - 1x daily - 7x weekly Scapular Retraction with Resistance - 10 reps - 3 sets - 1x daily - 7x weekly Scapular Retraction with Resistance Advanced - 10 reps - 3 sets - 1x daily - 7x weekly Standing Shoulder Horizontal Abduction with Resistance - 10 reps - 3 sets - 1x daily - 7x weekly Shoulder Internal Rotation with Resistance - 10 reps - 3 sets - 1x daily - 7x weekly Patient Education Trigger Point Dry Needling

## 2019-10-04 ENCOUNTER — Encounter: Payer: BC Managed Care – PPO | Admitting: Physical Therapy

## 2019-10-09 ENCOUNTER — Other Ambulatory Visit: Payer: Self-pay

## 2019-10-09 ENCOUNTER — Encounter: Payer: Self-pay | Admitting: Physical Therapy

## 2019-10-09 ENCOUNTER — Ambulatory Visit (INDEPENDENT_AMBULATORY_CARE_PROVIDER_SITE_OTHER): Payer: BC Managed Care – PPO | Admitting: Physical Therapy

## 2019-10-09 DIAGNOSIS — M6281 Muscle weakness (generalized): Secondary | ICD-10-CM

## 2019-10-09 DIAGNOSIS — M25511 Pain in right shoulder: Secondary | ICD-10-CM

## 2019-10-09 DIAGNOSIS — R293 Abnormal posture: Secondary | ICD-10-CM

## 2019-10-09 NOTE — Patient Instructions (Signed)
Access Code: [email or web address]  URL: [email or web address]  Date: 10/09/2019  Prepared by: Almyra Free Jannette Cotham   Exercises Standing Shoulder Flexion with Resistance - 10 reps - 3 sets - 1x daily - 7x weekly Standing Shoulder Scaption with Resistance - 10 reps - 3 sets - 1x daily - 7x weekly Standing Single Arm Shoulder Abduction with Resistance - 10 reps - 3 sets - 1x daily - 7x weekly

## 2019-10-09 NOTE — Therapy (Addendum)
[place], [place], [address] Phone: [phone]   Fax:  [phone]  Physical Therapy Treatment  Patient Details  Name: Michaela Dodson MRN: [id number] Date of Birth: [birth year] Referring Provider (PT): Silverio Decamp, MD   Encounter Date: 10/09/2019  PT End of Session - 10/09/19 1020    Visit Number  5    Number of Visits  12    Date for PT Re-Evaluation  11/01/19    PT Start Time  1020    PT Stop Time  1100    PT Time Calculation (min)  40 min    Activity Tolerance  Patient tolerated treatment well    Behavior During Therapy  [facility] for tasks assessed/performed       Past Medical History:  Diagnosis Date  . Allergy   . Depression   . GERD (gastroesophageal reflux disease)    Pt states that this has gone away since her recent weight loss  . H/O thromboembolism    superficial-left medial thigh lipoma, vericose veins  . Obesity    Pt has lost the weight and is no longer obese    Past Surgical History:  Procedure Laterality Date  . BREAST BIOPSY    . CARPAL TUNNEL RELEASE Right 09/2012   Dr. Apolonio Schneiders  . CARPAL TUNNEL RELEASE Left 11/2013   Dr. Apolonio Schneiders  . TUBAL LIGATION    . uterine ablation      There were no vitals filed for this visit.  Subjective Assessment - 10/09/19 1020    Subjective  Just achy now.    Currently in Pain?  Yes    Pain Score  2     Pain Location  Shoulder    Pain Orientation  Right    Pain Descriptors / Indicators  Aching                       [facility] Adult PT Treatment/Exercise - 10/09/19 0001      Shoulder Exercises: Prone   Retraction  Right;20 reps;Weights    Retraction Weight (lbs)  4    Extension  Right;20 reps;Weights    Extension Weight (lbs)  2    Extension Limitations  wt with 2nd set of 10    Horizontal ABduction 1  Right;20 reps    Horizontal ABduction 1 Limitations  painful at end range      Shoulder Exercises:  Sidelying   External Rotation  Right;20 reps;Weights    External Rotation Weight (lbs)  2    Other Sidelying Exercises  empty can 1# 2x10      Shoulder Exercises: Standing   Horizontal ABduction  Strengthening;Both;Theraband;20 reps    Theraband Level (Shoulder Horizontal ABduction)  Level 2 (Red)    External Rotation  Right;Theraband;20 reps   with pain at 13   Theraband Level (Shoulder External Rotation)  Level 2 (Red)    External Rotation Limitations  some pain    Internal Rotation  Right;20 reps;Theraband    Theraband Level (Shoulder Internal Rotation)  Level 2 (Red)    Row  Both;Theraband;10 reps    Theraband Level (Shoulder Row)  Level 2 (Red)    Other Standing Exercises  flex,scaption, ABD to 90 deg  2# x 10    Other Standing Exercises  upright row 4# ea x 10      Shoulder Exercises: ROM/Strengthening   UBE (Upper Arm Bike)  L2 x 4 min 2 fwd/2bwd  Iontophoresis   Type of Iontophoresis  Dexamethasone    Location  ant shoulder right    Dose  1.0 cc 80 mAmin    Time  4 hour patch             PT Education - 10/09/19 1347    Education Details  progressed HEP    Person(s) Educated  Patient    Methods  Explanation;Demonstration;Handout    Comprehension  Verbalized understanding;Returned demonstration          PT Long Term Goals - 10/09/19 1352      PT LONG TERM GOAL #1   Title  independent with HEP    Status  Partially Met      PT LONG TERM GOAL #4   Title  report pain < 4/10 for improved function    Status  Partially Met            Plan - 10/09/19 1347    Clinical Impression Statement  Patient tolerating most exercises without pain. She continues to demonstrate RC weakness and has pain into upper arm with prone T's and with ER.    PT Frequency  2x / week    PT Duration  6 weeks    PT Treatment/Interventions  ADLs/Self Care Home Management;Cryotherapy;Electrical Stimulation;Ultrasound;Moist Heat;Iontophoresis 73m/ml Dexamethasone;Functional  mobility training;Therapeutic activities;Therapeutic exercise;Patient/family education;Neuromuscular re-education;Manual techniques;Passive range of motion;Vasopneumatic Device;Taping;Dry needling    PT Next Visit Plan  DN to post cuff PRN, continue posture/shoulder exercises.    PT Home Exercise Plan  Access Code: [email or web address] also [email or web address]    Consulted and Agree with Plan of Care  Patient       Patient will benefit from skilled therapeutic intervention in order to improve the following deficits and impairments:  Increased fascial restricitons, Increased muscle spasms, Pain, Postural dysfunction, Decreased strength, Decreased range of motion  Visit Diagnosis: Acute pain of right shoulder  Abnormal posture  Muscle weakness (generalized)     Problem List Patient Active Problem List   Diagnosis Date Noted  . Biceps tendinitis of right shoulder 09/18/2019  . Family history of breast cancer in mother 09/06/2018  . Varicose veins of left leg with edema 06/06/2018  . Hyperbilirubinemia, suspect [place] RandySyndrome 09/16/2017  . Postgastrectomy malabsorption 09/16/2017  . Lipomatosis 04/27/2017  . Bipolar 1 disorder, depressed, moderate ([place] 03/29/2017  . Primary osteoarthritis of both knees 10/14/2016  . History of sleeve gastrectomy 09/18/2015  . Left cervical radiculopathy 09/16/2015  . Acute superficial venous thrombosis of left lower extremity 09/16/2015  . Former smoker 07/03/2015  . Acid reflux 04/11/2015  . H/O renal calculi 04/11/2015  . Hypercholesterolemia without hypertriglyceridemia 04/11/2015  . Disordered sleep 04/11/2015  . Snores 04/11/2015  . Lumbar facet arthropathy 03/06/2015  . Microscopic hematuria 06/06/2013  . Carpal tunnel syndrome 04/04/2012  . URINARY INCONTINENCE, URGE 09/24/2008  . ASTHMA, INTERMITTENT, MILD 02/06/2008  . DEPRESSION, SEVERE 08/25/2007    JMadelyn FlavorsPT 10/09/2019, 1:53 PM  [facility] [address] [address]Phone: [phone]  Fax:  [phone] Name: Michaela MCCONAHYMRN: [id number] of Birth: [birth year] PHYSICAL THERAPY DISCHARGE SUMMARY  Visits from Start of Care: 5  Current functional level related to goals / functional outcomes: SEE ABOVE   Remaining deficits: SEE ABOVE   Education / Equipment: HEP  Plan: Patient agrees to discharge.  Patient goals were partially met. Patient is being discharged due to not returning since the last visit.  ?????  Madelyn Flavors, PT 11/20/19 8:45 PM  [facility] [place], [place] [address]  [phone] (office) [phone] (fax)

## 2019-10-10 ENCOUNTER — Encounter: Payer: BC Managed Care – PPO | Admitting: Physical Therapy

## 2019-10-10 DIAGNOSIS — F3110 Bipolar disorder, current episode manic without psychotic features, unspecified: Secondary | ICD-10-CM | POA: Diagnosis not present

## 2019-10-12 DIAGNOSIS — K219 Gastro-esophageal reflux disease without esophagitis: Secondary | ICD-10-CM | POA: Diagnosis not present

## 2019-10-12 DIAGNOSIS — F419 Anxiety disorder, unspecified: Secondary | ICD-10-CM | POA: Diagnosis not present

## 2019-10-12 DIAGNOSIS — Z79899 Other long term (current) drug therapy: Secondary | ICD-10-CM | POA: Diagnosis not present

## 2019-10-12 DIAGNOSIS — E785 Hyperlipidemia, unspecified: Secondary | ICD-10-CM | POA: Diagnosis not present

## 2019-10-12 DIAGNOSIS — N390 Urinary tract infection, site not specified: Secondary | ICD-10-CM | POA: Diagnosis not present

## 2019-10-12 DIAGNOSIS — F17290 Nicotine dependence, other tobacco product, uncomplicated: Secondary | ICD-10-CM | POA: Diagnosis not present

## 2019-10-12 DIAGNOSIS — F329 Major depressive disorder, single episode, unspecified: Secondary | ICD-10-CM | POA: Diagnosis not present

## 2019-10-12 DIAGNOSIS — R300 Dysuria: Secondary | ICD-10-CM | POA: Diagnosis not present

## 2019-10-17 ENCOUNTER — Encounter: Payer: BC Managed Care – PPO | Admitting: Physical Therapy

## 2019-10-17 DIAGNOSIS — R319 Hematuria, unspecified: Secondary | ICD-10-CM | POA: Diagnosis not present

## 2019-10-30 ENCOUNTER — Ambulatory Visit: Payer: BC Managed Care – PPO | Admitting: Sports Medicine

## 2019-10-31 DIAGNOSIS — R319 Hematuria, unspecified: Secondary | ICD-10-CM | POA: Diagnosis not present

## 2019-11-01 DIAGNOSIS — N200 Calculus of kidney: Secondary | ICD-10-CM | POA: Diagnosis not present

## 2019-11-01 DIAGNOSIS — N3289 Other specified disorders of bladder: Secondary | ICD-10-CM | POA: Diagnosis not present

## 2019-11-01 DIAGNOSIS — R3129 Other microscopic hematuria: Secondary | ICD-10-CM | POA: Diagnosis not present

## 2019-11-01 DIAGNOSIS — R319 Hematuria, unspecified: Secondary | ICD-10-CM | POA: Diagnosis not present

## 2019-11-01 DIAGNOSIS — D1809 Hemangioma of other sites: Secondary | ICD-10-CM | POA: Diagnosis not present

## 2019-11-19 ENCOUNTER — Encounter: Payer: Self-pay | Admitting: Family Medicine

## 2019-11-20 ENCOUNTER — Encounter: Payer: Self-pay | Admitting: Family Medicine

## 2019-11-21 ENCOUNTER — Encounter: Payer: Self-pay | Admitting: Family Medicine

## 2019-12-08 ENCOUNTER — Encounter: Payer: Self-pay | Admitting: Family Medicine

## 2019-12-15 ENCOUNTER — Other Ambulatory Visit: Payer: Self-pay | Admitting: Family Medicine

## 2019-12-18 DIAGNOSIS — F3110 Bipolar disorder, current episode manic without psychotic features, unspecified: Secondary | ICD-10-CM | POA: Diagnosis not present

## 2019-12-27 ENCOUNTER — Encounter: Payer: Self-pay | Admitting: Family Medicine

## 2019-12-27 ENCOUNTER — Telehealth (INDEPENDENT_AMBULATORY_CARE_PROVIDER_SITE_OTHER): Payer: BC Managed Care – PPO | Admitting: Family Medicine

## 2019-12-27 DIAGNOSIS — R6882 Decreased libido: Secondary | ICD-10-CM | POA: Diagnosis not present

## 2019-12-27 DIAGNOSIS — F520 Hypoactive sexual desire disorder: Secondary | ICD-10-CM

## 2019-12-27 MED ORDER — ADDYI 100 MG PO TABS: 1.0000 | ORAL_TABLET | Freq: Every day | ORAL | 0 refills | Status: DC

## 2019-12-27 NOTE — Progress Notes (Signed)
Virtual Visit via Video Note  I connected with Michaela Dodson on 12/27/19 at  9:10 AM EST by a video enabled telemedicine application and verified that I am speaking with the correct person using two identifiers.   I discussed the limitations of evaluation and management by telemedicine and the availability of in person appointments. The patient expressed understanding and agreed to proceed.  Subjective:    CC: Discuss sexual libido  HPI:  48 year old female consulting for virtual visit today to discuss decreased sexual desire.  She says it is really been going on for quite some time.  She is getting to the point where she does not think it is a side effect from her psychiatric medications.  She really thinks it is related to being perimenopausal.  She had an ablation years ago so does not have a period anymore.  Denies any sexual dysfunction.  Past medical history, Surgical history, Family history not pertinant except as noted below, Social history, Allergies, and medications have been entered into the medical record, reviewed, and corrections made.   Review of Systems: No fevers, chills, night sweats, weight loss, chest pain, or shortness of breath.   Objective:    General: Speaking clearly in complete sentences without any shortness of breath.  Alert and oriented x3.  Normal judgment. No apparent acute distress.    Impression and Recommendations:   Decrease sexual desire- Discussed warnings about Addyi including avoiding use of alcohol close to timing of dosage of the medication.  And about CNS depression with concomitant use of her current medications.  She has discussed it with her psychiatrist.  We also discussed that we will try for 8 weeks and if at that point not effective then will discontinue.  We also discussed Wellbutrin as a secondary non-FDA approved option if this medication is not successful or she has side effects.  I discussed how there are other factors that can  influence sexual desire as well.    I discussed the assessment and treatment plan with the patient. The patient was provided an opportunity to ask questions and all were answered. The patient agreed with the plan and demonstrated an understanding of the instructions.   The patient was advised to call back or seek an in-person evaluation if the symptoms worsen or if the condition fails to improve as anticipated.   [name], MD

## 2019-12-27 NOTE — Progress Notes (Signed)
Pt wanted to know if this was an option for her.  Looking back in her records she asked about this medication back in 2017 and at that time this was not an option due to her psychological medications that she takes.

## 2019-12-29 ENCOUNTER — Encounter: Payer: Self-pay | Admitting: Family Medicine

## 2020-01-01 NOTE — Telephone Encounter (Signed)
Michaela Dodson,  Can you look into this? I know the pharmacy is the one responsible for back dating this prescription since they had to order it.. but I dont see any information about a PA in the chart. Please please please make sure that you are documenting everything you do as far a PA in a phone note so anyone can look into this and help out.  Thanks!

## 2020-01-01 NOTE — Telephone Encounter (Signed)
Received fax from [employer] that Addyi requires a PA. Information has been sent to the insurance company. Awaiting determination.

## 2020-01-15 DIAGNOSIS — F3110 Bipolar disorder, current episode manic without psychotic features, unspecified: Secondary | ICD-10-CM | POA: Diagnosis not present

## 2020-01-24 DIAGNOSIS — R319 Hematuria, unspecified: Secondary | ICD-10-CM | POA: Diagnosis not present

## 2020-01-24 DIAGNOSIS — N323 Diverticulum of bladder: Secondary | ICD-10-CM | POA: Diagnosis not present

## 2020-01-24 DIAGNOSIS — N200 Calculus of kidney: Secondary | ICD-10-CM | POA: Diagnosis not present

## 2020-01-24 DIAGNOSIS — N390 Urinary tract infection, site not specified: Secondary | ICD-10-CM | POA: Diagnosis not present

## 2020-01-29 ENCOUNTER — Encounter: Payer: Self-pay | Admitting: Family Medicine

## 2020-01-31 NOTE — Telephone Encounter (Signed)
Lord, with this message, lets just get her in for an appointment.

## 2020-02-06 ENCOUNTER — Ambulatory Visit (INDEPENDENT_AMBULATORY_CARE_PROVIDER_SITE_OTHER): Payer: BC Managed Care – PPO | Admitting: Sports Medicine

## 2020-02-06 ENCOUNTER — Other Ambulatory Visit: Payer: Self-pay

## 2020-02-06 ENCOUNTER — Encounter: Payer: Self-pay | Admitting: Sports Medicine

## 2020-02-06 DIAGNOSIS — I82812 Embolism and thrombosis of superficial veins of left lower extremities: Secondary | ICD-10-CM

## 2020-02-06 DIAGNOSIS — M7521 Bicipital tendinitis, right shoulder: Secondary | ICD-10-CM

## 2020-02-06 MED ORDER — CELECOXIB 200 MG PO CAPS: ORAL_CAPSULE | ORAL | 2 refills | Status: DC

## 2020-02-06 NOTE — Progress Notes (Addendum)
    Procedures performed today:    None.  Independent interpretation of tests performed by another provider:   I did personally review her x-rays that show degenerative changes of the acromioclavicular joint but nothing else acute or chronic.  Impression and Recommendations:    Biceps tendinitis of right shoulder This is a pleasant 49 year old female with chronic right shoulder pain, previously in September of last year we did a bicep sheath injection, she did some physical therapy, and I never saw her until today. She is telling me that she only had a few hours of relief. Pain over the deltoid, anteriorly, and worse with overhead activities. At this point she has failed an ultrasound-guided injection, physical therapy so we are going to proceed with a noncontrast MRI for surgical planning. She does have a chronic pain syndrome so we are going to add some Celebrex for now.  Acute superficial venous thrombosis of left lower extremity History of varicose veins and SVT. She does have a prominent left-sided varicose vein along her lateral calf. Increasing pain, I have advised lower extremity compression hose, she has no evidence of an SVT currently or DVT, negative Homans' sign.    ___________________________________________ Gwen Her. Dianah Field, M.D., ABFM., CAQSM. Primary Care and [place] Instructor of [place] of [facility] of Medicine

## 2020-02-06 NOTE — Assessment & Plan Note (Signed)
History of varicose veins and SVT. She does have a prominent left-sided varicose vein along her lateral calf. Increasing pain, I have advised lower extremity compression hose, she has no evidence of an SVT currently or DVT, negative Homans' sign.

## 2020-02-06 NOTE — Assessment & Plan Note (Addendum)
This is a pleasant 49 year old female with chronic right shoulder pain, previously in September of last year we did a bicep sheath injection, she did some physical therapy, and I never saw her until today. She is telling me that she only had a few hours of relief. Pain over the deltoid, anteriorly, and worse with overhead activities. At this point she has failed an ultrasound-guided injection, physical therapy so we are going to proceed with a noncontrast MRI for surgical planning. She does have a chronic pain syndrome so we are going to add some Celebrex for now.

## 2020-02-06 NOTE — Addendum Note (Signed)
Addended by: Silverio Decamp on: 02/06/2020 08:52 AM   Modules accepted: Orders

## 2020-02-11 ENCOUNTER — Other Ambulatory Visit: Payer: Self-pay

## 2020-02-11 ENCOUNTER — Ambulatory Visit (INDEPENDENT_AMBULATORY_CARE_PROVIDER_SITE_OTHER): Payer: BC Managed Care – PPO

## 2020-02-11 DIAGNOSIS — M7521 Bicipital tendinitis, right shoulder: Secondary | ICD-10-CM

## 2020-02-11 DIAGNOSIS — M25511 Pain in right shoulder: Secondary | ICD-10-CM | POA: Diagnosis not present

## 2020-02-26 ENCOUNTER — Encounter: Payer: Self-pay | Admitting: Sports Medicine

## 2020-02-26 ENCOUNTER — Other Ambulatory Visit: Payer: Self-pay

## 2020-02-26 ENCOUNTER — Ambulatory Visit (INDEPENDENT_AMBULATORY_CARE_PROVIDER_SITE_OTHER): Payer: BC Managed Care – PPO | Admitting: Sports Medicine

## 2020-02-26 DIAGNOSIS — M19011 Primary osteoarthritis, right shoulder: Secondary | ICD-10-CM

## 2020-02-26 NOTE — Assessment & Plan Note (Signed)
Michaela Dodson returns, she is a pleasant 49 year old female, chronic right shoulder pain, back in September of last year we did a bicep sheath injection, she did some physical therapy, and she only had short-term relief. We obtained an MRI that shows labral degeneration. Today I performed a glenohumeral joint injection, she will continue her rehab exercises, Celebrex, and return to see me in 1 month, if persistent discomfort I would like her to see Dr. Griffin Basil.

## 2020-02-26 NOTE — Progress Notes (Signed)
    Procedures performed today:    Procedure: Real-time Ultrasound Guided injection of the right glenohumeral joint Device: Samsung HS60  Verbal informed consent obtained.  Time-out conducted.  Noted no overlying erythema, induration, or other signs of local infection.  Skin prepped in a sterile fashion.  Local anesthesia: Topical Ethyl chloride.  With sterile technique and under real time ultrasound guidance: 1 cc Kenalog 40, 2 cc lidocaine, 2 cc bupivacaine injected easily Completed without difficulty  Pain immediately resolved suggesting accurate placement of the medication.  Advised to call if fevers/chills, erythema, induration, drainage, or persistent bleeding.  Images permanently stored and available for review in the ultrasound unit.  Impression: Technically successful ultrasound guided injection.  Independent interpretation of notes and tests performed by another provider:   None.  Impression and Recommendations:    Right shoulder glenohumeral labral degenerative changes Michaela Dodson returns, she is a pleasant 49 year old female, chronic right shoulder pain, back in September of last year we did a bicep sheath injection, she did some physical therapy, and she only had short-term relief. We obtained an MRI that shows labral degeneration. Today I performed a glenohumeral joint injection, she will continue her rehab exercises, Celebrex, and return to see me in 1 month, if persistent discomfort I would like her to see Dr. Griffin Basil.    ___________________________________________ Gwen Her. Dianah Field, M.D., ABFM., CAQSM. Primary Care and [place] Instructor of [place] of [facility] of Medicine

## 2020-03-06 DIAGNOSIS — Z1231 Encounter for screening mammogram for malignant neoplasm of breast: Secondary | ICD-10-CM | POA: Diagnosis not present

## 2020-03-06 LAB — HM MAMMOGRAPHY

## 2020-03-11 ENCOUNTER — Encounter: Payer: Self-pay | Admitting: Family Medicine

## 2020-03-11 DIAGNOSIS — F3110 Bipolar disorder, current episode manic without psychotic features, unspecified: Secondary | ICD-10-CM | POA: Diagnosis not present

## 2020-03-13 ENCOUNTER — Encounter: Payer: Self-pay | Admitting: Family Medicine

## 2020-03-18 DIAGNOSIS — N390 Urinary tract infection, site not specified: Secondary | ICD-10-CM | POA: Diagnosis not present

## 2020-03-25 ENCOUNTER — Ambulatory Visit: Payer: BC Managed Care – PPO | Admitting: Sports Medicine

## 2020-04-15 DIAGNOSIS — F3110 Bipolar disorder, current episode manic without psychotic features, unspecified: Secondary | ICD-10-CM | POA: Diagnosis not present

## 2020-05-23 DIAGNOSIS — R319 Hematuria, unspecified: Secondary | ICD-10-CM | POA: Diagnosis not present

## 2020-05-23 DIAGNOSIS — N323 Diverticulum of bladder: Secondary | ICD-10-CM | POA: Diagnosis not present

## 2020-05-23 DIAGNOSIS — N3946 Mixed incontinence: Secondary | ICD-10-CM | POA: Diagnosis not present

## 2020-05-30 DIAGNOSIS — Z2882 Immunization not carried out because of caregiver refusal: Secondary | ICD-10-CM | POA: Diagnosis not present

## 2020-05-30 DIAGNOSIS — Q250 Patent ductus arteriosus: Secondary | ICD-10-CM | POA: Diagnosis not present

## 2020-05-30 DIAGNOSIS — Q211 Atrial septal defect: Secondary | ICD-10-CM | POA: Diagnosis not present

## 2020-05-30 DIAGNOSIS — Z051 Observation and evaluation of newborn for suspected infectious condition ruled out: Secondary | ICD-10-CM | POA: Diagnosis not present

## 2020-05-30 DIAGNOSIS — Z20818 Contact with and (suspected) exposure to other bacterial communicable diseases: Secondary | ICD-10-CM | POA: Diagnosis not present

## 2020-05-31 ENCOUNTER — Encounter: Payer: Self-pay | Admitting: Family Medicine

## 2020-07-15 DIAGNOSIS — F3110 Bipolar disorder, current episode manic without psychotic features, unspecified: Secondary | ICD-10-CM | POA: Diagnosis not present

## 2020-07-24 DIAGNOSIS — M170 Bilateral primary osteoarthritis of knee: Secondary | ICD-10-CM

## 2020-07-24 NOTE — Telephone Encounter (Signed)
Michaela Dodson has bilateral knee osteoarthritis and did well several years ago with Jacklyn Shell, she has x-ray confirmed arthritis, she has been doing conservative treatment without any improvement, lets get her approved again for Orthovisc or any other preferred agent.

## 2020-07-25 DIAGNOSIS — M5137 Other intervertebral disc degeneration, lumbosacral region: Secondary | ICD-10-CM | POA: Diagnosis not present

## 2020-07-25 DIAGNOSIS — Z903 Acquired absence of stomach [part of]: Secondary | ICD-10-CM | POA: Diagnosis not present

## 2020-07-25 DIAGNOSIS — K912 Postsurgical malabsorption, not elsewhere classified: Secondary | ICD-10-CM | POA: Diagnosis not present

## 2020-07-25 DIAGNOSIS — K2970 Gastritis, unspecified, without bleeding: Secondary | ICD-10-CM | POA: Diagnosis not present

## 2020-07-25 DIAGNOSIS — K219 Gastro-esophageal reflux disease without esophagitis: Secondary | ICD-10-CM | POA: Diagnosis not present

## 2020-07-25 DIAGNOSIS — F329 Major depressive disorder, single episode, unspecified: Secondary | ICD-10-CM | POA: Diagnosis not present

## 2020-08-07 MED ORDER — SYNVISC 16 MG/2ML IX SOSY: PREFILLED_SYRINGE | INTRA_ARTICULAR | 3 refills | Status: DC

## 2020-08-07 NOTE — Telephone Encounter (Signed)
Synvisc seems to be the general preferred agent as an alternative to Orthovisc, I went ahead and sent this to the specialty pharmacy listed in the chart, please let me know the next steps.

## 2020-08-09 DIAGNOSIS — J4520 Mild intermittent asthma, uncomplicated: Secondary | ICD-10-CM | POA: Diagnosis not present

## 2020-08-09 DIAGNOSIS — K219 Gastro-esophageal reflux disease without esophagitis: Secondary | ICD-10-CM | POA: Diagnosis not present

## 2020-08-09 DIAGNOSIS — Z903 Acquired absence of stomach [part of]: Secondary | ICD-10-CM | POA: Diagnosis not present

## 2020-08-09 DIAGNOSIS — K912 Postsurgical malabsorption, not elsewhere classified: Secondary | ICD-10-CM | POA: Diagnosis not present

## 2020-08-16 DIAGNOSIS — N200 Calculus of kidney: Secondary | ICD-10-CM | POA: Diagnosis not present

## 2020-08-16 DIAGNOSIS — N390 Urinary tract infection, site not specified: Secondary | ICD-10-CM | POA: Diagnosis not present

## 2020-08-16 DIAGNOSIS — N3946 Mixed incontinence: Secondary | ICD-10-CM | POA: Diagnosis not present

## 2020-08-16 DIAGNOSIS — Z87448 Personal history of other diseases of urinary system: Secondary | ICD-10-CM | POA: Diagnosis not present

## 2020-08-21 ENCOUNTER — Telehealth: Payer: Self-pay | Admitting: Sports Medicine

## 2020-08-21 DIAGNOSIS — M170 Bilateral primary osteoarthritis of knee: Secondary | ICD-10-CM

## 2020-08-21 NOTE — Telephone Encounter (Signed)
Received fax for PA on Synvisc sent through cover my meds waiting on determination - CF

## 2020-08-23 MED ORDER — PREDNISONE 50 MG PO TABS: ORAL_TABLET | ORAL | 0 refills | Status: DC

## 2020-08-23 NOTE — Telephone Encounter (Signed)
I am always checking my stuff from home!  Sending in a 50 mg daily prednisone burst. :-P

## 2020-09-04 ENCOUNTER — Encounter: Payer: Self-pay | Admitting: Family Medicine

## 2020-09-04 NOTE — Telephone Encounter (Signed)
Received fax from [facility] they approved coverage on Synvisc.   Reference Number: [facility]  Valid: 08/21/2020 - 02/20/2021  I am calling to pharmacy to let them know of medication approval so they can start process of delivery and we can get patient scheduled. - CF

## 2020-09-04 NOTE — Telephone Encounter (Signed)
See patient's chart.

## 2020-09-09 ENCOUNTER — Other Ambulatory Visit: Payer: Self-pay

## 2020-09-09 ENCOUNTER — Ambulatory Visit (INDEPENDENT_AMBULATORY_CARE_PROVIDER_SITE_OTHER): Payer: BC Managed Care – PPO

## 2020-09-09 ENCOUNTER — Ambulatory Visit (INDEPENDENT_AMBULATORY_CARE_PROVIDER_SITE_OTHER): Payer: BC Managed Care – PPO | Admitting: Sports Medicine

## 2020-09-09 DIAGNOSIS — M50322 Other cervical disc degeneration at C5-C6 level: Secondary | ICD-10-CM | POA: Diagnosis not present

## 2020-09-09 DIAGNOSIS — M47816 Spondylosis without myelopathy or radiculopathy, lumbar region: Secondary | ICD-10-CM | POA: Diagnosis not present

## 2020-09-09 DIAGNOSIS — M50323 Other cervical disc degeneration at C6-C7 level: Secondary | ICD-10-CM | POA: Diagnosis not present

## 2020-09-09 DIAGNOSIS — M4602 Spinal enthesopathy, cervical region: Secondary | ICD-10-CM | POA: Diagnosis not present

## 2020-09-09 DIAGNOSIS — M5412 Radiculopathy, cervical region: Secondary | ICD-10-CM | POA: Diagnosis not present

## 2020-09-09 DIAGNOSIS — M25511 Pain in right shoulder: Secondary | ICD-10-CM | POA: Diagnosis not present

## 2020-09-09 MED ORDER — CELECOXIB 200 MG PO CAPS: ORAL_CAPSULE | ORAL | 2 refills | Status: DC

## 2020-09-09 NOTE — Progress Notes (Signed)
    Procedures performed today:    None.  Independent interpretation of notes and tests performed by another provider:   I did personally review her cervical spine x-rays from 2016 which show a large anterior bridging osteophyte at the C6-C7 level, she also has a moderate sized L3-L4 disc protrusion with desiccation.  This is from an MRI dated in 2018.  Brief History, Exam, Impression, and Recommendations:    Radiculitis of right cervical region This is a very pleasant 49 year old female with known cervical DDD, C6-C7 with a large anterior osteophyte. Having a recurrence of pain, right periscapular. Starting conservative, updated x-rays, Celebrex, formal physical therapy. She has had gastric sleeve, Celebrex should be okay as long as she does it with food. Return to see me in 4 to 6 weeks, we will proceed with MRI and epidural if no better.  Lumbar spondylosis Nayellie is having recurrence of low back pain, axial, nonradicular. She did have 100% temporary relief after some facet joint injections back to [facility]. She also had good relief with a lumbar epidural. We are going to proceed again with a lumbar epidural followed by facet joint injections if insufficient improvement with Dr. Francesco Runner. Return to see me 1 month after the injection.    ___________________________________________ Gwen Her. Dianah Field, M.D., ABFM., CAQSM. Primary Care and [place] Instructor of [place] of [facility] of Medicine

## 2020-09-09 NOTE — Assessment & Plan Note (Signed)
This is a very pleasant 49 year old female with known cervical DDD, C6-C7 with a large anterior osteophyte. Having a recurrence of pain, right periscapular. Starting conservative, updated x-rays, Celebrex, formal physical therapy. She has had gastric sleeve, Celebrex should be okay as long as she does it with food. Return to see me in 4 to 6 weeks, we will proceed with MRI and epidural if no better.

## 2020-09-09 NOTE — Assessment & Plan Note (Signed)
Michaela Dodson is having recurrence of low back pain, axial, nonradicular. She did have 100% temporary relief after some facet joint injections back to [facility]. She also had good relief with a lumbar epidural. We are going to proceed again with a lumbar epidural followed by facet joint injections if insufficient improvement with Dr. Francesco Runner. Return to see me 1 month after the injection.

## 2020-09-20 DIAGNOSIS — F3110 Bipolar disorder, current episode manic without psychotic features, unspecified: Secondary | ICD-10-CM | POA: Diagnosis not present

## 2020-09-23 ENCOUNTER — Other Ambulatory Visit: Payer: Self-pay

## 2020-09-23 ENCOUNTER — Encounter: Payer: Self-pay | Admitting: Rehabilitative and Restorative Service Providers"

## 2020-09-23 ENCOUNTER — Ambulatory Visit (INDEPENDENT_AMBULATORY_CARE_PROVIDER_SITE_OTHER): Payer: BC Managed Care – PPO | Admitting: Rehabilitative and Restorative Service Providers"

## 2020-09-23 DIAGNOSIS — M4726 Other spondylosis with radiculopathy, lumbar region: Secondary | ICD-10-CM | POA: Diagnosis not present

## 2020-09-23 DIAGNOSIS — M6281 Muscle weakness (generalized): Secondary | ICD-10-CM | POA: Diagnosis not present

## 2020-09-23 DIAGNOSIS — R293 Abnormal posture: Secondary | ICD-10-CM

## 2020-09-23 DIAGNOSIS — M25511 Pain in right shoulder: Secondary | ICD-10-CM

## 2020-09-23 DIAGNOSIS — M5136 Other intervertebral disc degeneration, lumbar region: Secondary | ICD-10-CM | POA: Diagnosis not present

## 2020-09-23 NOTE — Patient Instructions (Addendum)
Access Code: [email or web address] URL: [email or web address] Date: 09/23/2020 Prepared by: Rudell Cobb  Exercises Prone Scapular Slide with Shoulder Extension - 2 x daily - 7 x weekly - 1 sets - 10 reps Supine Thoracic Mobilization Towel Roll Vertical with Arm Stretch - 2 x daily - 7 x weekly - 1 sets - 1 reps - 1-2 minutes hold  Patient Education Trigger Point Dry Needling

## 2020-09-23 NOTE — Therapy (Signed)
[place], [place], [address] Phone: [phone]   Fax:  [phone]  Physical Therapy Evaluation  Patient Details  Name: Michaela Dodson MRN: [id number] Date of Birth: 09/15/1971 Referring Provider (PT): Aundria Mems, MD   Encounter Date: 09/23/2020   PT End of Session - 09/23/20 1423    Visit Number 1    Number of Visits 12    Date for PT Re-Evaluation 11/04/20    Authorization Type BCBS    PT Start Time 1345    PT Stop Time 1428    PT Time Calculation (min) 43 min    Activity Tolerance Patient tolerated treatment well    Behavior During Therapy [facility] for tasks assessed/performed           Past Medical History:  Diagnosis Date  . Allergy   . Depression   . GERD (gastroesophageal reflux disease)    Pt states that this has gone away since her recent weight loss  . H/O thromboembolism    superficial-left medial thigh lipoma, vericose veins  . Obesity    Pt has lost the weight and is no longer obese    Past Surgical History:  Procedure Laterality Date  . BREAST BIOPSY    . CARPAL TUNNEL RELEASE Right 09/2012   Dr. Apolonio Schneiders  . CARPAL TUNNEL RELEASE Left 11/2013   Dr. Apolonio Schneiders  . TUBAL LIGATION    . uterine ablation      There were no vitals filed for this visit.    Subjective Assessment - 09/23/20 1349    Subjective The patient has new onset of R lateral scapular "burning" sensation that is constant in nature.    Patient Stated Goals Get rid of pain R shoulder    Currently in Pain? Yes    Pain Score 8     Pain Location Back    Pain Orientation Upper;Right   parascapular   Pain Descriptors / Indicators Burning    Pain Type Acute pain    Pain Onset 1 to 4 weeks ago    Pain Frequency Constant    Aggravating Factors  sitting    Pain Relieving Factors unsure              [facility] PT Assessment - 09/23/20 1352      Assessment   Medical Diagnosis Cervical DDD     Referring Provider (PT) Aundria Mems, MD    Onset Date/Surgical Date 09/09/20    Hand Dominance Right      Precautions   Precautions None      Restrictions   Weight Bearing Restrictions No      Balance Screen   Has the patient fallen in the past 6 months No    Has the patient had a decrease in activity level because of a fear of falling?  No    Is the patient reluctant to leave their home because of a fear of falling?  No      Home [employer] residence      Prior Function   Level of Independence Independent      Observation/Other Assessments   Focus on Therapeutic Outcomes (FOTO)  ADD      Sensation   Light Touch Appears Intact      ROM / Strength   AROM / PROM / Strength AROM;Strength      AROM   Overall AROM  Within functional limits for tasks performed  Strength   Overall Strength Deficits    Overall Strength Comments bilateral UE weakness with 4-/5 bilat shoulder flexion/abduction, elbow flexion/extension      Flexibility   Soft Tissue Assessment /Muscle Length yes   tightness in R latissimus, rhomboids, upper trap and parasca     Palpation   Spinal mobility hypomobility in thoracic spine    Palpation comment tenderness t/o R parascapular region                      Objective measurements completed on examination: See above findings.       [facility] Adult PT Treatment/Exercise - 09/23/20 1403      Exercises   Exercises Shoulder      Shoulder Exercises: Prone   Retraction Strengthening;Both;10 reps    Extension Strengthening;Both;10 reps      Manual Therapy   Manual Therapy Soft tissue mobilization    Manual therapy comments skilled palpation to assess response to DN    Soft tissue mobilization STM latissimus R, upper trap, infraspinatus            Trigger Point Dry Needling - 09/23/20 1421    Consent Given? Yes    Education Handout Provided Yes    Muscles Treated Head and Neck Upper trapezius     Muscles Treated Upper Quadrant Latissimus dorsi;Teres major;Teres minor;Infraspinatus    Upper Trapezius Response Twitch reponse elicited;Palpable increased muscle length    Infraspinatus Response Palpable increased muscle length    Latissimus dorsi Response Palpable increased muscle length    Teres major Response Twitch response elicited;Palpable increased muscle length    Teres minor Response Palpable increased muscle length                PT Education - 09/23/20 1423    Education Details HEP    Person(s) Educated Patient    Methods Explanation;Demonstration;Handout    Comprehension Returned demonstration;Verbalized understanding               PT Long Term Goals - 09/23/20 1627      PT LONG TERM GOAL #1   Title The patient will be indep with HEP.    Time 6    Period Weeks    Target Date 11/04/20      PT LONG TERM GOAL #2   Title The patient will report no sensations of burning in R scapular region.    Time 6    Period Weeks    Target Date 11/04/20      PT LONG TERM GOAL #3   Title The patient will improve bilateral UE strength to 4/5 for improved functional strength.    Time 6    Period Months    Target Date 11/04/20      PT LONG TERM GOAL #4   Title The patient will report no pain in sitting or standing in R scapula.    Baseline 8/10    Time 6    Period Weeks    Target Date 11/04/20                  Plan - 09/23/20 1629    Clinical Impression Statement The patient is a 49 yo female presenting to OP physical therapy with R parascapular burning and pain.  She has impairments of bilat UE weakness, myofascial tightness R latissimus, teres, upper trap, rhomboids, burning sensation in R parasapular musculature, hypomobility in t-spine.  Pt reports limitations in IADLs due to constant sensation in R shoulder  blade.  PT to address to return to prior functional status.    Examination-Activity Limitations Lift;Reach Overhead;Sit    Stability/Clinical  Decision Making Stable/Uncomplicated    Clinical Decision Making Low    Rehab Potential Good    PT Frequency 2x / week    PT Duration 6 weeks    PT Treatment/Interventions ADLs/Self Care Home Management;Therapeutic activities;Therapeutic exercise;Cryotherapy;Electrical Stimulation;Iontophoresis 4mg /ml Dexamethasone;Moist Heat;Manual techniques;Dry needling;Taping;Patient/family education    PT Next Visit Plan assess response to DN, STM, scapular stabilization, postural strengthening, prone thoracic mobilization and self mob for home    PT Home Exercise Plan Access Code: [email or web address]    Consulted and Agree with Plan of Care Patient           Patient will benefit from skilled therapeutic intervention in order to improve the following deficits and impairments:  Pain, Decreased strength, Increased fascial restricitons, Hypomobility, Impaired flexibility, Postural dysfunction  Visit Diagnosis: Acute pain of right shoulder  Abnormal posture  Muscle weakness (generalized)     Problem List Patient Active Problem List   Diagnosis Date Noted  . Right shoulder glenohumeral labral degenerative changes 09/18/2019  . Family history of breast cancer in mother 09/06/2018  . Varicose veins of left leg with edema 06/06/2018  . Hyperbilirubinemia, suspect [name] Syndrome 09/16/2017  . Postgastrectomy malabsorption 09/16/2017  . Lipomatosis 04/27/2017  . Bipolar 1 disorder, depressed, moderate ([place]) 03/29/2017  . Primary osteoarthritis of both knees 10/14/2016  . History of sleeve gastrectomy 09/18/2015  . Radiculitis of right cervical region 09/16/2015  . Acute superficial venous thrombosis of left lower extremity 09/16/2015  . Former smoker 07/03/2015  . Acid reflux 04/11/2015  . H/O renal calculi 04/11/2015  . Hypercholesterolemia without hypertriglyceridemia 04/11/2015  . Disordered sleep 04/11/2015  . Snores 04/11/2015  . Lumbar spondylosis 03/06/2015  . Microscopic hematuria 06/06/2013   . Carpal tunnel syndrome 04/04/2012  . URINARY INCONTINENCE, URGE 09/24/2008  . ASTHMA, INTERMITTENT, MILD 02/06/2008  . DEPRESSION, SEVERE 08/25/2007    [place], PT 09/23/2020, 4:33 PM  [facility] [place], [place], [address] Phone: [phone]   Fax:  [phone]  Name: Michaela Dodson MRN: [id number] Date of Birth: 12-27-1971

## 2020-09-30 ENCOUNTER — Ambulatory Visit (INDEPENDENT_AMBULATORY_CARE_PROVIDER_SITE_OTHER): Payer: BC Managed Care – PPO | Admitting: Rehabilitative and Restorative Service Providers"

## 2020-09-30 ENCOUNTER — Other Ambulatory Visit: Payer: Self-pay

## 2020-09-30 DIAGNOSIS — M6281 Muscle weakness (generalized): Secondary | ICD-10-CM

## 2020-09-30 DIAGNOSIS — M25511 Pain in right shoulder: Secondary | ICD-10-CM

## 2020-09-30 DIAGNOSIS — R293 Abnormal posture: Secondary | ICD-10-CM

## 2020-09-30 NOTE — Therapy (Signed)
[facility] [address] [place] [address] [place], [place], [address] Phone: [phone]   Fax:  [phone]  Physical Therapy Treatment  Patient Details  Name: Michaela Dodson MRN: [id number] Date of Birth: [birth year] Referring Provider (PT): Rodney Langton, MD   Encounter Date: 09/30/2020   PT End of Session - 09/30/20 1354    Visit Number 2    Number of Visits 12    Date for PT Re-Evaluation 11/04/20    Authorization Type BCBS    PT Start Time 1347    PT Stop Time 1430    PT Time Calculation (min) 43 min    Activity Tolerance Patient tolerated treatment well    Behavior During Therapy [facility] for tasks assessed/performed           Past Medical History:  Diagnosis Date  . Allergy   . Depression   . GERD (gastroesophageal reflux disease)    Pt states that this has gone away since her recent weight loss  . H/O thromboembolism    superficial-left medial thigh lipoma, vericose veins  . Obesity    Pt has lost the weight and is no longer obese    Past Surgical History:  Procedure Laterality Date  . BREAST BIOPSY    . CARPAL TUNNEL RELEASE Right 09/2012   Dr. Orlan Leavens  . CARPAL TUNNEL RELEASE Left 11/2013   Dr. Orlan Leavens  . TUBAL LIGATION    . uterine ablation      There were no vitals filed for this visit.   Subjective Assessment - 09/30/20 1350    Subjective The patient reports she felt great the day after evaluation and then symptoms began to return.  She reports pain is worse with sitting posture.    Patient Stated Goals Get rid of pain R shoulder    Currently in Pain? Yes    Pain Score 3     Pain Location Back    Pain Orientation Right;Upper    Pain Descriptors / Indicators Burning    Pain Type Acute pain    Pain Onset More than a month ago    Pain Frequency Constant    Aggravating Factors  sitting    Pain Relieving Factors unsure              [facility] PT Assessment - 09/30/20 1353      Assessment   Medical  Diagnosis Cervical DDD    Referring Provider (PT) Rodney Langton, MD    Onset Date/Surgical Date 09/09/20    Hand Dominance Right                         [facility] Adult PT Treatment/Exercise - 09/30/20 1353      Self-Care   Self-Care Other Self-Care Comments    Other Self-Care Comments  sitting posture with cues for lumbar support for improved postural positioning      Exercises   Exercises Shoulder;Elbow      Elbow Exercises   Elbow Flexion Strengthening;Both;10 reps    Bar Weights/Barbell (Elbow Flexion) 3 lbs    Other elbow exercises aware of discomfort in inferior border of scapula      Shoulder Exercises: Standing   External Rotation Strengthening;Both;12 reps    External Rotation Limitations standing L with pool noodle    Retraction Strengthening;Both;12 reps    Retraction Limitations with pool noodle    Diagonals Strengthening;Both;10 reps    Theraband Level (Shoulder Diagonals) Level 2 (Red)  Diagonals Limitations with cues for technique    Other Standing Exercises shoulder rolls x 10 reps    Other Standing Exercises STRENGTHENING:  Standing overhead press with 3 lb weights bilat; shoulder abduction x 12 reps with 3 lb weights bilat      Shoulder Exercises: ROM/Strengthening   UBE (Upper Arm Bike) L2 x 1.5 minutes forward/1.5 minutes backward      Manual Therapy   Manual Therapy Soft tissue mobilization    Manual therapy comments skilled palpation to assess response to DN; STM with tennis ball against wall for parascapular STM    Soft tissue mobilization STM latissimus, rhomboids, infraspinatus            Trigger Point Dry Needling - 09/30/20 1422    Consent Given? Yes    Education Handout Provided Previously provided    Muscles Treated Upper Quadrant Latissimus dorsi;Teres minor;Infraspinatus;Rhomboids    Rhomboids Response Twitch response elicited;Palpable increased muscle length    Infraspinatus Response Palpable increased muscle length     Latissimus dorsi Response Palpable increased muscle length    Teres minor Response Palpable increased muscle length                PT Education - 09/30/20 1418    Education Details HEP modified    Person(s) Educated Patient    Methods Explanation;Handout;Demonstration    Comprehension Verbalized understanding;Returned demonstration               PT Long Term Goals - 09/30/20 1354      PT LONG TERM GOAL #1   Title The patient will be indep with HEP.    Time 6    Period Weeks      PT LONG TERM GOAL #2   Title The patient will report no sensations of burning in R scapular region.    Time 6    Period Weeks      PT LONG TERM GOAL #3   Title The patient will improve bilateral UE strength to 4/5 for improved functional strength.    Time 6    Period Months      PT LONG TERM GOAL #4   Title The patient will report no pain in sitting or standing in R scapula.    Baseline 8/10    Time 6    Period Weeks                 Plan - 09/30/20 1630    Clinical Impression Statement The patient reports improvement with DN after first eval, but it did not last. PT emphasized need for HEP within first 1-2 days after DN to get full benefit.  Patient was hindered last visit due to low back injection and not being able to do HEP.  PT to continue working on scapular stabilization, STM, and working to [employer].    Rehab Potential Good    PT Frequency 2x / week    PT Duration 6 weeks    PT Treatment/Interventions ADLs/Self Care Home Management;Therapeutic activities;Therapeutic exercise;Cryotherapy;Electrical Stimulation;Iontophoresis 4mg /ml Dexamethasone;Moist Heat;Manual techniques;Dry needling;Taping;Patient/family education    PT Next Visit Plan assess response to DN, STM, scapular stabilization, postural strengthening, prone thoracic mobilization and self mob for home    PT Home Exercise Plan Access Code: [email or web address]    Consulted and Agree with Plan of Care Patient            Patient will benefit from skilled therapeutic intervention in order to improve the following deficits and impairments:  Pain,  Decreased strength, Increased fascial restricitons, Hypomobility, Impaired flexibility, Postural dysfunction  Visit Diagnosis: Acute pain of right shoulder  Abnormal posture  Muscle weakness (generalized)     Problem List Patient Active Problem List   Diagnosis Date Noted  . Right shoulder glenohumeral labral degenerative changes 09/18/2019  . Family history of breast cancer in mother 09/06/2018  . Varicose veins of left leg with edema 06/06/2018  . Hyperbilirubinemia, suspect Sullivan Lone Syndrome 09/16/2017  . Postgastrectomy malabsorption 09/16/2017  . Lipomatosis 04/27/2017  . Bipolar 1 disorder, depressed, moderate ([facility]) 03/29/2017  . Primary osteoarthritis of both knees 10/14/2016  . History of sleeve gastrectomy 09/18/2015  . Radiculitis of right cervical region 09/16/2015  . Acute superficial venous thrombosis of left lower extremity 09/16/2015  . Former smoker 07/03/2015  . Acid reflux 04/11/2015  . H/O renal calculi 04/11/2015  . Hypercholesterolemia without hypertriglyceridemia 04/11/2015  . Disordered sleep 04/11/2015  . Snores 04/11/2015  . Lumbar spondylosis 03/06/2015  . Microscopic hematuria 06/06/2013  . Carpal tunnel syndrome 04/04/2012  . URINARY INCONTINENCE, URGE 09/24/2008  . ASTHMA, INTERMITTENT, MILD 02/06/2008  . DEPRESSION, SEVERE 08/25/2007    Amalya Salmons, PT 09/30/2020, 4:32 PM  [facility] [address] [place] [address] [place], [place], [address] Phone: [phone]   Fax:  [phone]  Name: BRENESHA GOVEIA MRN: [id number] Date of Birth: 05-Sep-1971

## 2020-09-30 NOTE — Patient Instructions (Signed)
Access Code: [email or web address] URL: [email or web address] Date: 09/30/2020 Prepared by: Rudell Cobb  Exercises Prone Scapular Slide with Shoulder Extension - 2 x daily - 7 x weekly - 1 sets - 10 reps Supine Thoracic Mobilization Towel Roll Vertical with Arm Stretch - 2 x daily - 7 x weekly - 1 sets - 1 reps - 1-2 minutes hold Standing Shoulder Horizontal and Diagonal Pulls with Resistance - 2 x daily - 7 x weekly - 1 sets - 10 reps Supine Mid-Thoracic Mobilization - 2 x daily - 7 x weekly - 1 sets - 1 reps

## 2020-10-02 ENCOUNTER — Other Ambulatory Visit: Payer: Self-pay

## 2020-10-02 ENCOUNTER — Ambulatory Visit (INDEPENDENT_AMBULATORY_CARE_PROVIDER_SITE_OTHER): Payer: BC Managed Care – PPO | Admitting: Rehabilitative and Restorative Service Providers"

## 2020-10-02 DIAGNOSIS — R293 Abnormal posture: Secondary | ICD-10-CM

## 2020-10-02 DIAGNOSIS — M6281 Muscle weakness (generalized): Secondary | ICD-10-CM

## 2020-10-02 DIAGNOSIS — M25511 Pain in right shoulder: Secondary | ICD-10-CM | POA: Diagnosis not present

## 2020-10-02 NOTE — Patient Instructions (Signed)
Access Code: [email or web address] URL: [email or web address] Date: 10/02/2020 Prepared by: Rudell Cobb  Exercises Prone Scapular Slide with Shoulder Extension - 2 x daily - 7 x weekly - 1 sets - 10 reps Sidelying Thoracic Rotation with Open Book - 2 x daily - 7 x weekly - 1 sets - 10 reps Supine Thoracic Mobilization Towel Roll Vertical with Arm Stretch - 2 x daily - 7 x weekly - 1 sets - 1 reps - 1-2 minutes hold Supine Mid-Thoracic Mobilization - 2 x daily - 7 x weekly - 1 sets - 1 reps Standing Shoulder Horizontal and Diagonal Pulls with Resistance - 2 x daily - 7 x weekly - 1 sets - 10 reps Standing Quadratus Lumborum Stretch with Doorway - 2 x daily - 7 x weekly - 1 sets - 10 reps Standing Row with Resistance with Anchored Resistance at Chest Height Palms Down - 2 x daily - 7 x weekly - 1 sets - 10 reps

## 2020-10-02 NOTE — Therapy (Signed)
[place], [place], [address] Phone: [phone]   Fax:  [phone]  Physical Therapy Treatment  Patient Details  Name: Michaela Dodson MRN: [id number] Date of Birth: [birth year] Referring Provider (PT): Aundria Mems, MD   Encounter Date: 10/02/2020   PT End of Session - 10/02/20 0950    Visit Number 3    Number of Visits 12    Date for PT Re-Evaluation 11/04/20    Authorization Type BCBS    PT Start Time 0805    PT Stop Time 0852    PT Time Calculation (min) 47 min    Activity Tolerance Patient tolerated treatment well    Behavior During Therapy [facility] for tasks assessed/performed           Past Medical History:  Diagnosis Date  . Allergy   . Depression   . GERD (gastroesophageal reflux disease)    Pt states that this has gone away since her recent weight loss  . H/O thromboembolism    superficial-left medial thigh lipoma, vericose veins  . Obesity    Pt has lost the weight and is no longer obese    Past Surgical History:  Procedure Laterality Date  . BREAST BIOPSY    . CARPAL TUNNEL RELEASE Right 09/2012   Dr. Apolonio Schneiders  . CARPAL TUNNEL RELEASE Left 11/2013   Dr. Apolonio Schneiders  . TUBAL LIGATION    . uterine ablation      There were no vitals filed for this visit.   Subjective Assessment - 10/02/20 0818    Subjective The patient reports she had a flare up of pain yesterday while sitting x 1 hour at an MD appt with her mother.  It was 100/10 yesterday.    Patient Stated Goals Get rid of pain R shoulder    Currently in Pain? Yes    Pain Score 3     Pain Location Back    Pain Orientation Right;Upper    Pain Descriptors / Indicators Burning;Discomfort    Pain Onset More than a month ago    Pain Frequency Constant    Aggravating Factors  sitting    Pain Relieving Factors unsure              [facility] PT Assessment - 10/02/20 0823      Assessment   Medical Diagnosis Cervical  DDD    Referring Provider (PT) Aundria Mems, MD    Onset Date/Surgical Date 09/09/20                         [facility] Adult PT Treatment/Exercise - 10/02/20 0823      Exercises   Exercises Shoulder;Lumbar      Lumbar Exercises: Stretches   Other Lumbar Stretch Exercise QL right side lengthening in sidelying left      Lumbar Exercises: Standing   Row Both    Theraband Level (Row) Level 2 (Red)    Row Limitations standing mini squat with row attempted, but patient could not tolerate (we were going to position to do pull downs); then performed standing with mid back rows with arms beginning at 90 degrees flexion    Shoulder Extension Strengthening;Both;10 reps    Theraband Level (Shoulder Extension) Level 2 (Red)    Shoulder Extension Limitations some mild back pain noted    Other Standing Lumbar Exercises standing ball isometrics  x 15 reps      Lumbar Exercises: Quadruped  Madcat/Old Horse 10 reps    Madcat/Old Horse Limitations with tactile and demo cues for technique working on increasing spinal moiblity    Other Quadruped Lumbar Exercises thread the needle for thoracic opening and stretch x 2 reps each side      Shoulder Exercises: Sidelying   Other Sidelying Exercises sidelying horizontal shoulder abduction x 10 reps R and L for chest opening and spinal mobility      Modalities   Modalities Electrical Stimulation;Moist Heat      Moist Heat Therapy   Number Minutes Moist Heat 12 Minutes    Moist Heat Location Lumbar Spine   upper back     Electrical Stimulation   Electrical Stimulation Location mid to low back    Electrical Stimulation Action interferential    Electrical Stimulation Parameters to tolerance    Electrical Stimulation Goals Pain      Manual Therapy   Manual Therapy Soft tissue mobilization    Soft tissue mobilization intercostal STM                  PT Education - 10/02/20 0847    Education Details HEP    Person(s)  Educated Patient    Methods Explanation;Demonstration    Comprehension Returned demonstration;Verbalized understanding               PT Long Term Goals - 09/30/20 1354      PT LONG TERM GOAL #1   Title The patient will be indep with HEP.    Time 6    Period Weeks      PT LONG TERM GOAL #2   Title The patient will report no sensations of burning in R scapular region.    Time 6    Period Weeks      PT LONG TERM GOAL #3   Title The patient will improve bilateral UE strength to 4/5 for improved functional strength.    Time 6    Period Months      PT LONG TERM GOAL #4   Title The patient will report no pain in sitting or standing in R scapula.    Baseline 8/10    Time 6    Period Weeks                 Plan - 10/02/20 0950    Clinical Impression Statement The patient had increased pain yesterday with sitting position.  We discussed working through spinal mobility to improve low back range of motion for sitting position (she stays posteriorly tilted placing mid back on stretch).  Patient had increased awareness of low back during session that provokes concern due to long h/o chronic pain.  PT modified activities to improve tolerance.    Rehab Potential Good    PT Frequency 2x / week    PT Duration 6 weeks    PT Treatment/Interventions ADLs/Self Care Home Management;Therapeutic activities;Therapeutic exercise;Cryotherapy;Electrical Stimulation;Iontophoresis 4mg /ml Dexamethasone;Moist Heat;Manual techniques;Dry needling;Taping;Patient/family education    PT Next Visit Plan assess response to DN, STM, scapular stabilization, postural strengthening, prone thoracic mobilization and self mob for home    PT Home Exercise Plan Access Code: [email or web address]    Consulted and Agree with Plan of Care Patient           Patient will benefit from skilled therapeutic intervention in order to improve the following deficits and impairments:  Pain, Decreased strength, Increased fascial  restricitons, Hypomobility, Impaired flexibility, Postural dysfunction  Visit Diagnosis: Acute pain of right shoulder  Abnormal  posture  Muscle weakness (generalized)     Problem List Patient Active Problem List   Diagnosis Date Noted  . Right shoulder glenohumeral labral degenerative changes 09/18/2019  . Family history of breast cancer in mother 09/06/2018  . Varicose veins of left leg with edema 06/06/2018  . Hyperbilirubinemia, suspect [name] Syndrome 09/16/2017  . Postgastrectomy malabsorption 09/16/2017  . Lipomatosis 04/27/2017  . Bipolar 1 disorder, depressed, moderate ([place]) 03/29/2017  . Primary osteoarthritis of both knees 10/14/2016  . History of sleeve gastrectomy 09/18/2015  . Radiculitis of right cervical region 09/16/2015  . Acute superficial venous thrombosis of left lower extremity 09/16/2015  . Former smoker 07/03/2015  . Acid reflux 04/11/2015  . H/O renal calculi 04/11/2015  . Hypercholesterolemia without hypertriglyceridemia 04/11/2015  . Disordered sleep 04/11/2015  . Snores 04/11/2015  . Lumbar spondylosis 03/06/2015  . Microscopic hematuria 06/06/2013  . Carpal tunnel syndrome 04/04/2012  . URINARY INCONTINENCE, URGE 09/24/2008  . ASTHMA, INTERMITTENT, MILD 02/06/2008  . DEPRESSION, SEVERE 08/25/2007    [place], PT 10/02/2020, 9:52 AM  [facility] [place], [place], [address] Phone: [phone]   Fax:  [phone]  Name: Michaela Dodson MRN: [id number] Date of Birth: Jun 14, 1971

## 2020-10-09 ENCOUNTER — Ambulatory Visit (INDEPENDENT_AMBULATORY_CARE_PROVIDER_SITE_OTHER): Payer: BC Managed Care – PPO | Admitting: Rehabilitative and Restorative Service Providers"

## 2020-10-09 ENCOUNTER — Other Ambulatory Visit: Payer: Self-pay

## 2020-10-09 DIAGNOSIS — M6281 Muscle weakness (generalized): Secondary | ICD-10-CM | POA: Diagnosis not present

## 2020-10-09 DIAGNOSIS — R293 Abnormal posture: Secondary | ICD-10-CM

## 2020-10-09 DIAGNOSIS — M25511 Pain in right shoulder: Secondary | ICD-10-CM

## 2020-10-09 NOTE — Therapy (Signed)
[place], [place], [address] Phone: [phone]   Fax:  [phone]  Physical Therapy Treatment  Patient Details  Name: Michaela Dodson MRN: [id number] Date of Birth: 15-Jun-1971 Referring Provider (PT): Aundria Mems, MD   Encounter Date: 10/09/2020   PT End of Session - 10/09/20 0812    Visit Number 4    Number of Visits 12    Date for PT Re-Evaluation 11/04/20    Authorization Type BCBS    PT Start Time 0805    PT Stop Time 0855    PT Time Calculation (min) 50 min    Activity Tolerance Patient tolerated treatment well    Behavior During Therapy [facility] for tasks assessed/performed           Past Medical History:  Diagnosis Date  . Allergy   . Depression   . GERD (gastroesophageal reflux disease)    Pt states that this has gone away since her recent weight loss  . H/O thromboembolism    superficial-left medial thigh lipoma, vericose veins  . Obesity    Pt has lost the weight and is no longer obese    Past Surgical History:  Procedure Laterality Date  . BREAST BIOPSY    . CARPAL TUNNEL RELEASE Right 09/2012   Dr. Apolonio Schneiders  . CARPAL TUNNEL RELEASE Left 11/2013   Dr. Apolonio Schneiders  . TUBAL LIGATION    . uterine ablation      There were no vitals filed for this visit.   Subjective Assessment - 10/09/20 0808    Subjective "Everything hurts again."  She reports her back is "catching" with specific movements.  She also feels some proximal shoulder discomfort like a tendon is pulling.    Patient Stated Goals Get rid of pain R shoulder    Currently in Pain? Yes    Pain Score 5     Pain Location Shoulder    Pain Orientation Right;Upper    Pain Descriptors / Indicators Burning;Discomfort    Pain Type Acute pain    Pain Onset More than a month ago    Pain Frequency Constant    Aggravating Factors  sitting    Pain Relieving Factors unsure    Multiple Pain Sites Yes    Pain Score 5     Pain Location Back    Pain Orientation Lower    Pain Descriptors / Indicators Aching;Discomfort;Sore    Pain Type Chronic pain    Pain Onset More than a month ago    Pain Frequency Intermittent    Aggravating Factors  catches with certain movements    Pain Relieving Factors unsure              [facility] PT Assessment - 10/09/20 8416      Assessment   Medical Diagnosis Cervical DDD    Referring Provider (PT) Aundria Mems, MD    Onset Date/Surgical Date 09/09/20    Hand Dominance Right                         [facility] Adult PT Treatment/Exercise - 10/09/20 0812      Exercises   Exercises Shoulder;Lumbar      Lumbar Exercises: Stretches   Other Lumbar Stretch Exercise gentle lumbar rocking for mobility to assist postural positioning to reduce parascapular pain      Lumbar Exercises: Supine   Heel Slides 5 reps    Heel Slides Limitations with a  small towel roll for assisting with lumbar extension/anterior tilt working to neutral    Bent Knee Raise 3 seconds    Bent Knee Raise Limitations 3 reps with isometric holds R and L sides      Shoulder Exercises: Supine   Protraction Strengthening;Right;10 reps    Protraction Weight (lbs) 1    Protraction Limitations warmed up without weight and then added weight for resistance.    External Rotation Strengthening;Right;10 reps    External Rotation Weight (lbs) 1      Shoulder Exercises: Sidelying   Other Sidelying Exercises sidelying horizontal shoulder abduction x 10 reps R and L for chest opening and spinal mobility      Shoulder Exercises: Standing   Retraction Strengthening;Both;10 reps    Theraband Level (Shoulder Retraction) Level 1 (Yellow)    Diagonals Strengthening;Both;10 reps    Theraband Level (Shoulder Diagonals) Level 1 (Yellow)    Diagonals Limitations cues to maintain shoulder depression      Shoulder Exercises: Stretch   Other Shoulder Stretches biceps stretch in door frame x 2 reps x 20  seconds    Other Shoulder Stretches pec wall stretch R -- reproduces R scapular pain      Modalities   Modalities Electrical Stimulation;Moist Heat      Moist Heat Therapy   Number Minutes Moist Heat 12 Minutes    Moist Heat Location --   thoracic spine     Electrical Stimulation   Electrical Stimulation Location mid back/ R parascapular    Electrical Stimulation Action interferential    Electrical Stimulation Parameters to tolerance    Electrical Stimulation Goals Pain      Manual Therapy   Manual Therapy Soft tissue mobilization    Manual therapy comments sidelying position    Soft tissue mobilization scapular STM with subscapularis and rhomboid STM                       PT Long Term Goals - 09/30/20 1354      PT LONG TERM GOAL #1   Title The patient will be indep with HEP.    Time 6    Period Weeks      PT LONG TERM GOAL #2   Title The patient will report no sensations of burning in R scapular region.    Time 6    Period Weeks      PT LONG TERM GOAL #3   Title The patient will improve bilateral UE strength to 4/5 for improved functional strength.    Time 6    Period Months      PT LONG TERM GOAL #4   Title The patient will report no pain in sitting or standing in R scapula.    Baseline 8/10    Time 6    Period Weeks                 Plan - 10/09/20 9326    Clinical Impression Statement The patient has fear avoidance behaviors due to chronic low back pain.  We discussed modifying exercise to meet her needs versus avoiding HEP when soreness develops.  PT to continue working towards [place].  Patient has reproduction of scapular pain with R biceps/pec wall stretch.    Rehab Potential Good    PT Frequency 2x / week    PT Duration 6 weeks    PT Treatment/Interventions ADLs/Self Care Home Management;Therapeutic activities;Therapeutic exercise;Cryotherapy;Electrical Stimulation;Iontophoresis 4mg /ml Dexamethasone;Moist Heat;Manual techniques;Dry  needling;Taping;Patient/family education  PT Next Visit Plan STM, scapular stabilization, postural strengthening, prone thoracic mobilization and self mob for home    PT Home Exercise Plan Access Code: [email or web address]    Consulted and Agree with Plan of Care Patient           Patient will benefit from skilled therapeutic intervention in order to improve the following deficits and impairments:  Pain, Decreased strength, Increased fascial restricitons, Hypomobility, Impaired flexibility, Postural dysfunction  Visit Diagnosis: Acute pain of right shoulder  Abnormal posture  Muscle weakness (generalized)     Problem List Patient Active Problem List   Diagnosis Date Noted  . Right shoulder glenohumeral labral degenerative changes 09/18/2019  . Family history of breast cancer in mother 09/06/2018  . Varicose veins of left leg with edema 06/06/2018  . Hyperbilirubinemia, suspect [name] Syndrome 09/16/2017  . Postgastrectomy malabsorption 09/16/2017  . Lipomatosis 04/27/2017  . Bipolar 1 disorder, depressed, moderate ([place]) 03/29/2017  . Primary osteoarthritis of both knees 10/14/2016  . History of sleeve gastrectomy 09/18/2015  . Radiculitis of right cervical region 09/16/2015  . Acute superficial venous thrombosis of left lower extremity 09/16/2015  . Former smoker 07/03/2015  . Acid reflux 04/11/2015  . H/O renal calculi 04/11/2015  . Hypercholesterolemia without hypertriglyceridemia 04/11/2015  . Disordered sleep 04/11/2015  . Snores 04/11/2015  . Lumbar spondylosis 03/06/2015  . Microscopic hematuria 06/06/2013  . Carpal tunnel syndrome 04/04/2012  . URINARY INCONTINENCE, URGE 09/24/2008  . ASTHMA, INTERMITTENT, MILD 02/06/2008  . DEPRESSION, SEVERE 08/25/2007    [place], PT 10/09/2020, 9:00 AM  [facility] [place], [place], [address] Phone: [phone]   Fax:  [phone]  Name: Michaela Dodson MRN: [id number] Date of Birth: [birth year]

## 2020-10-14 ENCOUNTER — Ambulatory Visit (INDEPENDENT_AMBULATORY_CARE_PROVIDER_SITE_OTHER): Payer: BC Managed Care – PPO | Admitting: Rehabilitative and Restorative Service Providers"

## 2020-10-14 ENCOUNTER — Other Ambulatory Visit: Payer: Self-pay

## 2020-10-14 DIAGNOSIS — M25511 Pain in right shoulder: Secondary | ICD-10-CM

## 2020-10-14 DIAGNOSIS — M6281 Muscle weakness (generalized): Secondary | ICD-10-CM

## 2020-10-14 DIAGNOSIS — R293 Abnormal posture: Secondary | ICD-10-CM

## 2020-10-14 NOTE — Therapy (Signed)
[place], [place], [address] Phone: [phone]   Fax:  [phone]  Physical Therapy Treatment  Patient Details  Name: Michaela Dodson MRN: [id number] Date of Birth: 08-08-1971 Referring Provider (PT): Aundria Mems, MD   Encounter Date: 10/14/2020   PT End of Session - 10/14/20 0810    Visit Number 5    Number of Visits 12    Date for PT Re-Evaluation 11/04/20    Authorization Type BCBS    PT Start Time 0806    PT Stop Time 0845    PT Time Calculation (min) 39 min    Activity Tolerance Patient tolerated treatment well    Behavior During Therapy [facility] for tasks assessed/performed           Past Medical History:  Diagnosis Date  . Allergy   . Depression   . GERD (gastroesophageal reflux disease)    Pt states that this has gone away since her recent weight loss  . H/O thromboembolism    superficial-left medial thigh lipoma, vericose veins  . Obesity    Pt has lost the weight and is no longer obese    Past Surgical History:  Procedure Laterality Date  . BREAST BIOPSY    . CARPAL TUNNEL RELEASE Right 09/2012   Dr. Apolonio Schneiders  . CARPAL TUNNEL RELEASE Left 11/2013   Dr. Apolonio Schneiders  . TUBAL LIGATION    . uterine ablation      There were no vitals filed for this visit.   Subjective Assessment - 10/14/20 0810    Subjective No pain this morning, but had extensive pain over the weekend when sitting for longer periods.    Patient Stated Goals Get rid of pain R shoulder    Currently in Pain? No/denies              [facility] PT Assessment - 10/14/20 0811      Assessment   Medical Diagnosis Cervical DDD    Referring Provider (PT) Aundria Mems, MD    Onset Date/Surgical Date 09/09/20                         [facility] Adult PT Treatment/Exercise - 10/14/20 0811      Exercises   Exercises Shoulder;Lumbar      Lumbar Exercises: Standing   Functional Squats 5 reps     Functional Squats Limitations spending time with demo and discussion of technique; patient notes "achy" sensation in her back after squats    Other Standing Lumbar Exercises Overhead lifting wiht 2 lbs R and L for 12 reps for core engagement + upper back loading    Other Standing Lumbar Exercises overhead lifting with yoga block x 5 reps with core engagement      Lumbar Exercises: Prone   Other Prone Lumbar Exercises prone on elbows      Lumbar Exercises: Quadruped   Madcat/Old Horse 10 reps    Madcat/Old Horse Limitations with tactile cues    Other Quadruped Lumbar Exercises quadriped gentle rocking      Shoulder Exercises: Supine   Other Supine Exercises towel roll stretch supine x 2 minutes      Shoulder Exercises: Prone   Retraction Strengthening;Both;12 reps    Retraction Limitations arms at 90 and elbows bent    Other Prone Exercises prone thoracic rotation x 10 reps      Shoulder Exercises: Standing   External Rotation Strengthening;Both;12 reps  External Rotation Limitations with pool noodle and arms at neutral (elbows bend)    ABduction Strengthening;Both;12 reps    Shoulder ABduction Weight (lbs) 2 each hand    Row Strengthening;Both;12 reps    Theraband Level (Shoulder Row) Level 1 (Yellow)    Retraction Strengthening;Both;12 reps    Theraband Level (Shoulder Retraction) Level 1 (Yellow)    Retraction Limitations with feet staggered to reduce low back strain      Moist Heat Therapy   Number Minutes Moist Heat 10 Minutes    Moist Heat Location --   at end of session-- no charge     Manual Therapy   Manual Therapy Soft tissue mobilization;Joint mobilization    Manual therapy comments to improve spinal mobility thoracic spine     Joint Mobilization Prone grade II PA mobilization and L UPA mobs for R rotation mid t-spine    Soft tissue mobilization STM R parascapular region-- no point tenderness today                       PT Long Term Goals -  10/14/20 1610      PT LONG TERM GOAL #1   Title The patient will be indep with HEP.    Time 6    Period Weeks      PT LONG TERM GOAL #2   Title The patient will report no sensations of burning in R scapular region.    Time 6    Period Weeks    Target Date 11/04/20      PT LONG TERM GOAL #3   Title The patient will improve bilateral UE strength to 4/5 for improved functional strength.    Time 6    Period Months      PT LONG TERM GOAL #4   Title The patient will report no pain in sitting or standing in R scapula.    Baseline 8/10    Time 6    Period Weeks                 Plan - 10/14/20 9604    Clinical Impression Statement The patient is tolerating ther ex progression adding gentle quadriped and mini squats in clinic.  PT encouraging continued mobility and participation in HEP.  PT to continue progressing to patient tolerance.  She has chronic low back pain and h/o R shoulder pain, which both appear to contribute to maladaptive compensatory strategies.    Rehab Potential Good    PT Frequency 2x / week    PT Duration 6 weeks    PT Treatment/Interventions ADLs/Self Care Home Management;Therapeutic activities;Therapeutic exercise;Cryotherapy;Electrical Stimulation;Iontophoresis 4mg /ml Dexamethasone;Moist Heat;Manual techniques;Dry needling;Taping;Patient/family education    PT Next Visit Plan STM, scapular stabilization, postural strengthening, prone thoracic mobilization and self mob for home    PT Home Exercise Plan Access Code: [email or web address]    Consulted and Agree with Plan of Care Patient           Patient will benefit from skilled therapeutic intervention in order to improve the following deficits and impairments:  Pain, Decreased strength, Increased fascial restricitons, Hypomobility, Impaired flexibility, Postural dysfunction  Visit Diagnosis: Acute pain of right shoulder  Abnormal posture  Muscle weakness (generalized)     Problem List Patient Active Problem  List   Diagnosis Date Noted  . Right shoulder glenohumeral labral degenerative changes 09/18/2019  . Family history of breast cancer in mother 09/06/2018  . Varicose veins of left leg with edema 06/06/2018  .  Hyperbilirubinemia, suspect [name] Syndrome 09/16/2017  . Postgastrectomy malabsorption 09/16/2017  . Lipomatosis 04/27/2017  . Bipolar 1 disorder, depressed, moderate ([place]) 03/29/2017  . Primary osteoarthritis of both knees 10/14/2016  . History of sleeve gastrectomy 09/18/2015  . Radiculitis of right cervical region 09/16/2015  . Acute superficial venous thrombosis of left lower extremity 09/16/2015  . Former smoker 07/03/2015  . Acid reflux 04/11/2015  . H/O renal calculi 04/11/2015  . Hypercholesterolemia without hypertriglyceridemia 04/11/2015  . Disordered sleep 04/11/2015  . Snores 04/11/2015  . Lumbar spondylosis 03/06/2015  . Microscopic hematuria 06/06/2013  . Carpal tunnel syndrome 04/04/2012  . URINARY INCONTINENCE, URGE 09/24/2008  . ASTHMA, INTERMITTENT, MILD 02/06/2008  . DEPRESSION, SEVERE 08/25/2007    [place], PT 10/14/2020, 8:47 AM  [facility] [place], [place], [address] Phone: [phone]   Fax:  [phone]  Name: Michaela Dodson MRN: [id number] Date of Birth: Dec 19, 1971

## 2020-10-16 ENCOUNTER — Other Ambulatory Visit: Payer: Self-pay

## 2020-10-16 ENCOUNTER — Ambulatory Visit (INDEPENDENT_AMBULATORY_CARE_PROVIDER_SITE_OTHER): Payer: BC Managed Care – PPO | Admitting: Rehabilitative and Restorative Service Providers"

## 2020-10-16 DIAGNOSIS — M6281 Muscle weakness (generalized): Secondary | ICD-10-CM | POA: Diagnosis not present

## 2020-10-16 DIAGNOSIS — R293 Abnormal posture: Secondary | ICD-10-CM

## 2020-10-16 DIAGNOSIS — M25511 Pain in right shoulder: Secondary | ICD-10-CM

## 2020-10-16 NOTE — Patient Instructions (Signed)
Access Code: [email or web address] URL: [email or web address] Date: 10/16/2020 Prepared by: Rudell Cobb  Exercises Prone Scapular Slide with Shoulder Extension - 2 x daily - 7 x weekly - 1 sets - 10 reps Sidelying Thoracic Rotation with Open Book - 2 x daily - 7 x weekly - 1 sets - 10 reps Supine Thoracic Mobilization Towel Roll Vertical with Arm Stretch - 2 x daily - 7 x weekly - 1 sets - 1 reps - 1-2 minutes hold Supine Mid-Thoracic Mobilization - 2 x daily - 7 x weekly - 1 sets - 1 reps Standing Shoulder Horizontal and Diagonal Pulls with Resistance - 2 x daily - 7 x weekly - 1 sets - 10 reps Standing Quadratus Lumborum Stretch with Doorway - 2 x daily - 7 x weekly - 1 sets - 10 reps Standing Row with Anchored Resistance - 2 x daily - 7 x weekly - 1 sets - 10 reps

## 2020-10-16 NOTE — Therapy (Addendum)
[place], [place], [address] Phone: [phone]   Fax:  [phone]  Physical Therapy Treatment and Discharge Summary  Patient Details  Name: Michaela Dodson MRN: [id number] Date of Birth: Nov 24, 1971 Referring Provider (PT): Aundria Mems, MD   Encounter Date: 10/16/2020   PT End of Session - 10/16/20 0916    Visit Number 6    Number of Visits 12    Date for PT Re-Evaluation 11/04/20    Authorization Type BCBS    PT Start Time [id number]    PT Stop Time 0932    PT Time Calculation (min) 39 min    Activity Tolerance Patient tolerated treatment well    Behavior During Therapy [facility] for tasks assessed/performed           Past Medical History:  Diagnosis Date  . Allergy   . Depression   . GERD (gastroesophageal reflux disease)    Pt states that this has gone away since her recent weight loss  . H/O thromboembolism    superficial-left medial thigh lipoma, vericose veins  . Obesity    Pt has lost the weight and is no longer obese    Past Surgical History:  Procedure Laterality Date  . BREAST BIOPSY    . CARPAL TUNNEL RELEASE Right 09/2012   Dr. Apolonio Schneiders  . CARPAL TUNNEL RELEASE Left 11/2013   Dr. Apolonio Schneiders  . TUBAL LIGATION    . uterine ablation      There were no vitals filed for this visit.   Subjective Assessment - 10/16/20 0855    Subjective The patient inquires about next step if therapy is not helping.  She asked about PT addressing the low back pain in order to improve posture and positioning.  She has had extensive low back treatment in the past with chronic pain.    Patient Stated Goals Get rid of pain R shoulder    Currently in Pain? Yes    Pain Score 3     Pain Location Back    Pain Orientation Right;Lower    Pain Descriptors / Indicators Burning;Discomfort    Pain Type Acute pain    Pain Onset More than a month ago    Pain Frequency Constant    Aggravating Factors  at  rest hurts today--- feels it in R QL    Pain Relieving Factors unsure              [facility] PT Assessment - 10/16/20 0859      Assessment   Medical Diagnosis Cervical DDD    Referring Provider (PT) Aundria Mems, MD    Onset Date/Surgical Date 09/09/20    Hand Dominance Right      Strength   Overall Strength Deficits    Overall Strength Comments R shoulder flexion 4/5, shoulder abduction 5/5, L shoulder flexion 4/5, shoulder abduction 5/5.    Strength Assessment Site Shoulder                         [facility] Adult PT Treatment/Exercise - 10/16/20 0859      Self-Care   Self-Care Other Self-Care Comments    Other Self-Care Comments  discussed PT plan; updated HEP and also recommended home walking increasing duration to tolerance; discussed pain neuroscience with cues for ambulation at intensity enough to raise HR       Exercises   Exercises Shoulder;Lumbar      Lumbar Exercises: Stretches  Single Knee to Chest Stretch Right;Left;2 reps;20 seconds      Lumbar Exercises: Supine   Other Supine Lumbar Exercises trunk rotation supine      Shoulder Exercises: Standing   Row Strengthening;Both;12 reps    Theraband Level (Shoulder Row) Level 1 (Yellow)    Row Limitations moved HEP from high row to low row position due to discomfort; provided postural cues      Shoulder Exercises: ROM/Strengthening   UBE (Upper Arm Bike) L2 x 2 minutes forward/1 minute backwards      Manual Therapy   Manual Therapy Joint mobilization;Soft tissue mobilization    Manual therapy comments to improve spinal mobility and decrease pain    Joint Mobilization prone grade II PA in thoracic spine and thoracolumbarjunction; sitting overpressure with rotation thoracic spine; and sidelying lateral glides thoracolumbar junction    Soft tissue mobilization STM R QL and parascapualr musculature                  PT Education - 10/16/20 0925    Education Details HEP updated     Person(s) Educated Patient    Methods Explanation;Demonstration;Handout    Comprehension Returned demonstration;Verbalized understanding               PT Long Term Goals - 10/16/20 0917      PT LONG TERM GOAL #1   Title The patient will be indep with HEP.    Time 6    Period Weeks    Status On-going      PT LONG TERM GOAL #2   Title The patient will report no sensations of burning in R scapular region.    Baseline No burning at baseline today-- pain is in low back.  sitting for extended periods aggravates R scapular pain    Time 6    Period Weeks    Status Partially Met      PT LONG TERM GOAL #3   Title The patient will improve bilateral UE strength to 4/5 for improved functional strength.    Baseline 4/5 for shoulderflexion and 5/5 for shoulder abduction R.    Time 6    Period Months    Status Achieved      PT LONG TERM GOAL #4   Title The patient will report no pain in sitting or standing in R scapula.    Baseline The patient continues with up to 8/10 pain when sitting for long periods.  When moving and doing activity, she does not have pain unless standing for long periods-- then low back and R shoulder hurt.    Time 6    Period Weeks    Status Partially Met                 Plan - 10/16/20 0900    Clinical Impression Statement The patient does not notice significant change with PT at this time.  She has h/o low back pain and R shoulder pain that appear related to current R parascapular dysfunction.  The burning sensation occurs at inferior angle of scapula and is worse with sitting.  She maintains a posterior pelvic tilt with dec'd lumbar lordosis and guards in her low back. As we work on scapular exercises, she is noting increased low back discomfort.  She has partially met LTGs.  She is going to call MD office to schedule to reassessment with Dr. Dianah Field.    Rehab Potential Good    PT Frequency 2x / week    PT  Duration 6 weeks    PT  Treatment/Interventions ADLs/Self Care Home Management;Therapeutic activities;Therapeutic exercise;Cryotherapy;Electrical Stimulation;Iontophoresis 22m/ml Dexamethasone;Moist Heat;Manual techniques;Dry needling;Taping;Patient/family education    PT Next Visit Plan On hold to f/u with MD re: low back pain and overall recommendations.    PT Home Exercise Plan Access Code: [email or web address]   Consulted and Agree with Plan of Care Patient           Patient will benefit from skilled therapeutic intervention in order to improve the following deficits and impairments:  Pain, Decreased strength, Increased fascial restricitons, Hypomobility, Impaired flexibility, Postural dysfunction  Visit Diagnosis: Acute pain of right shoulder  Abnormal posture  Muscle weakness (generalized)     Problem List Patient Active Problem List   Diagnosis Date Noted  . Right shoulder glenohumeral labral degenerative changes 09/18/2019  . Family history of breast cancer in mother 09/06/2018  . Varicose veins of left leg with edema 06/06/2018  . Hyperbilirubinemia, suspect [place] RandySyndrome 09/16/2017  . Postgastrectomy malabsorption 09/16/2017  . Lipomatosis 04/27/2017  . Bipolar 1 disorder, depressed, moderate ([place] 03/29/2017  . Primary osteoarthritis of both knees 10/14/2016  . History of sleeve gastrectomy 09/18/2015  . Radiculitis of right cervical region 09/16/2015  . Acute superficial venous thrombosis of left lower extremity 09/16/2015  . Former smoker 07/03/2015  . Acid reflux 04/11/2015  . H/O renal calculi 04/11/2015  . Hypercholesterolemia without hypertriglyceridemia 04/11/2015  . Disordered sleep 04/11/2015  . Snores 04/11/2015  . Lumbar spondylosis 03/06/2015  . Microscopic hematuria 06/06/2013  . Carpal tunnel syndrome 04/04/2012  . URINARY INCONTINENCE, URGE 09/24/2008  . ASTHMA, INTERMITTENT, MILD 02/06/2008  . DEPRESSION, SEVERE 08/25/2007   PHYSICAL THERAPY DISCHARGE SUMMARY  Visits from  Start of Care: 6  Current functional level related to goals / functional outcomes: See goals above  Remaining deficits: Patient has continued chronic low back pain and scapular/thoracic pain-- last status noted above.   Education / Equipment: HEP  Plan: Patient agrees to discharge.  Patient goals were partially met. Patient is being discharged due to the patient's request.  ?????         Thank you for the referral of this patient. CRudell Cobb MPT  WNicol[place] P[place]10/20/2021, 12:33 PM  [facility] [address]Phone: [phone]  Fax:  [phone] Name: Michaela DUNDONMRN: [id number] of Birth: [birth year]

## 2020-10-22 ENCOUNTER — Ambulatory Visit (INDEPENDENT_AMBULATORY_CARE_PROVIDER_SITE_OTHER): Payer: BC Managed Care – PPO | Admitting: Sports Medicine

## 2020-10-22 ENCOUNTER — Other Ambulatory Visit: Payer: Self-pay

## 2020-10-22 DIAGNOSIS — M47816 Spondylosis without myelopathy or radiculopathy, lumbar region: Secondary | ICD-10-CM | POA: Diagnosis not present

## 2020-10-22 DIAGNOSIS — M170 Bilateral primary osteoarthritis of knee: Secondary | ICD-10-CM

## 2020-10-22 DIAGNOSIS — M5412 Radiculopathy, cervical region: Secondary | ICD-10-CM | POA: Diagnosis not present

## 2020-10-22 DIAGNOSIS — F3132 Bipolar disorder, current episode depressed, moderate: Secondary | ICD-10-CM | POA: Diagnosis not present

## 2020-10-22 MED ORDER — DULOXETINE HCL 30 MG PO CPEP: 30.0000 mg | ORAL_CAPSULE | Freq: Every day | ORAL | 3 refills | Status: DC

## 2020-10-22 MED ORDER — SYNVISC 16 MG/2ML IX SOSY: PREFILLED_SYRINGE | INTRA_ARTICULAR | 3 refills | Status: DC

## 2020-10-22 NOTE — Assessment & Plan Note (Signed)
Synvisc did get approved, awaiting pharmacy information so I can send all the syringes. We will schedule injections when we receive the delivery.

## 2020-10-22 NOTE — Telephone Encounter (Signed)
[place] [place] [address]  [address], [place] [address]  Phone:  [phone] Fax:  [phone]

## 2020-10-22 NOTE — Assessment & Plan Note (Addendum)
Michaela Dodson continues to have neck pain, she has known cervical DDD, C6-C7 with a large anterior osteophyte. Symptoms are predominately right periscapular. Not much better with Celebrex, PT Proceeding with MRI for epidural planning, likely right C6-C7 interlaminar.  Multilevel DDD, referral to Dr. Francesco Runner for right C6-C7 interlaminar epidural.

## 2020-10-22 NOTE — Assessment & Plan Note (Signed)
Initial good response with bilateral L3-L5 facet joint injections with [facility]. Also had good relief with a lumbar epidural in the past. At the last visit we sent her for a lumbar epidural with Dr. Francesco Runner without much relief, at this point I am going to switch to medial branch blocks and potential RFA with Dr. Francesco Runner as well.

## 2020-10-22 NOTE — Progress Notes (Addendum)
    Procedures performed today:    None.  Independent interpretation of notes and tests performed by another provider:   None.  Brief History, Exam, Impression, and Recommendations:    Primary osteoarthritis of both knees Synvisc did get approved, awaiting pharmacy information so I can send all the syringes. We will schedule injections when we receive the delivery.  Lumbar spondylosis Initial good response with bilateral L3-L5 facet joint injections with [facility]. Also had good relief with a lumbar epidural in the past. At the last visit we sent her for a lumbar epidural with Dr. Francesco Runner without much relief, at this point I am going to switch to medial branch blocks and potential RFA with Dr. Francesco Runner as well.  Radiculitis of right cervical region Janique continues to have neck pain, she has known cervical DDD, C6-C7 with a large anterior osteophyte. Symptoms are predominately right periscapular. Not much better with Celebrex, PT Proceeding with MRI for epidural planning, likely right C6-C7 interlaminar.  Multilevel DDD, referral to Dr. Francesco Runner for right C6-C7 interlaminar epidural.  Bipolar 1 disorder, depressed, moderate ([place]) Danaija is crying and tearful in the exam room, she is currently in depressive phase of her bipolar disorder, as we all know, one can never control musculoskeletal pain unless we gain control of the concurrent mood disorder, continue current mood stabilizers but I do think we need to add low-dose Cymbalta. Return to see PCP in 4 to 6 weeks for this. She will of course need to keep a close eye out for any development of manic symptoms.    ___________________________________________ Gwen Her. Dianah Field, M.D., ABFM., CAQSM. Primary Care and [place] Instructor of [place] of [facility] of Medicine

## 2020-10-22 NOTE — Telephone Encounter (Signed)
Michaela Dodson which pharmacy am I sending this to, the patient was not aware that she got this approved.

## 2020-10-22 NOTE — Telephone Encounter (Signed)
Done

## 2020-10-22 NOTE — Assessment & Plan Note (Signed)
Michaela Dodson is crying and tearful in the exam room, she is currently in depressive phase of her bipolar disorder, as we all know, one can never control musculoskeletal pain unless we gain control of the concurrent mood disorder, continue current mood stabilizers but I do think we need to add low-dose Cymbalta. Return to see PCP in 4 to 6 weeks for this. She will of course need to keep a close eye out for any development of manic symptoms.

## 2020-10-23 ENCOUNTER — Encounter: Payer: BC Managed Care – PPO | Admitting: Rehabilitative and Restorative Service Providers"

## 2020-10-25 MED ORDER — TRAMADOL HCL 50 MG PO TABS
50.0000 mg | ORAL_TABLET | Freq: Three times a day (TID) | ORAL | 0 refills | Status: DC | PRN
Start: 2020-10-25 — End: 2021-01-22

## 2020-10-26 ENCOUNTER — Ambulatory Visit (INDEPENDENT_AMBULATORY_CARE_PROVIDER_SITE_OTHER): Payer: BC Managed Care – PPO

## 2020-10-26 ENCOUNTER — Other Ambulatory Visit: Payer: Self-pay

## 2020-10-26 DIAGNOSIS — M4802 Spinal stenosis, cervical region: Secondary | ICD-10-CM | POA: Diagnosis not present

## 2020-10-26 DIAGNOSIS — M4722 Other spondylosis with radiculopathy, cervical region: Secondary | ICD-10-CM | POA: Diagnosis not present

## 2020-10-26 DIAGNOSIS — M5412 Radiculopathy, cervical region: Secondary | ICD-10-CM | POA: Diagnosis not present

## 2020-10-26 DIAGNOSIS — M50123 Cervical disc disorder at C6-C7 level with radiculopathy: Secondary | ICD-10-CM | POA: Diagnosis not present

## 2020-10-26 DIAGNOSIS — M50121 Cervical disc disorder at C4-C5 level with radiculopathy: Secondary | ICD-10-CM | POA: Diagnosis not present

## 2020-10-28 NOTE — Addendum Note (Signed)
Addended by: Silverio Decamp on: 10/28/2020 04:02 PM   Modules accepted: Orders

## 2020-11-02 ENCOUNTER — Encounter: Payer: Self-pay | Admitting: Family Medicine

## 2020-11-11 DIAGNOSIS — M170 Bilateral primary osteoarthritis of knee: Secondary | ICD-10-CM | POA: Diagnosis not present

## 2020-11-25 DIAGNOSIS — F3110 Bipolar disorder, current episode manic without psychotic features, unspecified: Secondary | ICD-10-CM | POA: Diagnosis not present

## 2020-11-26 DIAGNOSIS — M47816 Spondylosis without myelopathy or radiculopathy, lumbar region: Secondary | ICD-10-CM | POA: Diagnosis not present

## 2020-12-04 ENCOUNTER — Ambulatory Visit: Payer: BC Managed Care – PPO | Admitting: Sports Medicine

## 2020-12-11 ENCOUNTER — Ambulatory Visit (INDEPENDENT_AMBULATORY_CARE_PROVIDER_SITE_OTHER): Payer: BC Managed Care – PPO | Admitting: Sports Medicine

## 2020-12-11 ENCOUNTER — Encounter: Payer: Self-pay | Admitting: Sports Medicine

## 2020-12-11 ENCOUNTER — Ambulatory Visit (INDEPENDENT_AMBULATORY_CARE_PROVIDER_SITE_OTHER): Payer: BC Managed Care – PPO

## 2020-12-11 DIAGNOSIS — F3132 Bipolar disorder, current episode depressed, moderate: Secondary | ICD-10-CM | POA: Diagnosis not present

## 2020-12-11 DIAGNOSIS — M170 Bilateral primary osteoarthritis of knee: Secondary | ICD-10-CM

## 2020-12-11 MED ORDER — DULOXETINE HCL 60 MG PO CPEP: 60.0000 mg | ORAL_CAPSULE | Freq: Every day | ORAL | 3 refills | Status: DC

## 2020-12-11 NOTE — Progress Notes (Signed)
    Procedures performed today:    Procedure: Real-time Ultrasound Guided injection of the left knee Device: Samsung HS60  Verbal informed consent obtained.  Time-out conducted.  Noted no overlying erythema, induration, or other signs of local infection.  Skin prepped in a sterile fashion.  Local anesthesia: Topical Ethyl chloride.  With sterile technique and under real time ultrasound guidance:  1 syringe of Synvisc injected into the suprapatellar recess.   Completed without difficulty  Advised to call if fevers/chills, erythema, induration, drainage, or persistent bleeding.  Images permanently stored and available for review in PACS.  Impression: Technically successful ultrasound guided injection.  Procedure: Real-time Ultrasound Guided injection of the right knee Device: Samsung HS60  Verbal informed consent obtained.  Time-out conducted.  Noted no overlying erythema, induration, or other signs of local infection.  Skin prepped in a sterile fashion.  Local anesthesia: Topical Ethyl chloride.  With sterile technique and under real time ultrasound guidance:  1 syringe of Synvisc injected into the suprapatellar recess completed without difficulty  Advised to call if fevers/chills, erythema, induration, drainage, or persistent bleeding.  Images permanently stored and available for review in PACS.  Impression: Technically successful ultrasound guided injection.  Independent interpretation of notes and tests performed by another provider:   None.  Brief History, Exam, Impression, and Recommendations:    Primary osteoarthritis of both knees Synvisc injection of her 1 of 3 into both knees, return in 1 week for a #2 of 3.  Bipolar 1 disorder, depressed, moderate (HCC) Crying and tearful in the exam room, currently in depressive phase of bipolar disorder, we will never be able to gain full control over musculoskeletal pain unless we get control of her concurrent mood disorder, we  added low-dose Cymbalta at the last visit approximately 3 weeks ago, not really feeling much efficacy though we understand that we need to wait 4 to 6 weeks, I am going to go ahead and go up to 60 mg of Cymbalta for the time being, and we can revisit this with no further changes over the next 6 weeks. She will also discontinue her Klonopin for now, her main concern is excessive sedation from her medications. Keep close follow-up with PCP.   No suicidal or homicidal ideation.    ___________________________________________ Gwen Her. Dianah Field, M.D., ABFM., CAQSM. Primary Care and [place] Instructor of [place] of [facility] of Medicine

## 2020-12-11 NOTE — Assessment & Plan Note (Signed)
Synvisc injection of her 1 of 3 into both knees, return in 1 week for a #2 of 3.

## 2020-12-11 NOTE — Assessment & Plan Note (Signed)
Crying and tearful in the exam room, currently in depressive phase of bipolar disorder, we will never be able to gain full control over musculoskeletal pain unless we get control of her concurrent mood disorder, we added low-dose Cymbalta at the last visit approximately 3 weeks ago, not really feeling much efficacy though we understand that we need to wait 4 to 6 weeks, I am going to go ahead and go up to 60 mg of Cymbalta for the time being, and we can revisit this with no further changes over the next 6 weeks. She will also discontinue her Klonopin for now, her main concern is excessive sedation from her medications. Keep close follow-up with PCP.   No suicidal or homicidal ideation.

## 2020-12-18 ENCOUNTER — Ambulatory Visit (INDEPENDENT_AMBULATORY_CARE_PROVIDER_SITE_OTHER): Payer: BC Managed Care – PPO

## 2020-12-18 ENCOUNTER — Ambulatory Visit (INDEPENDENT_AMBULATORY_CARE_PROVIDER_SITE_OTHER): Payer: BC Managed Care – PPO | Admitting: Sports Medicine

## 2020-12-18 ENCOUNTER — Other Ambulatory Visit: Payer: Self-pay

## 2020-12-18 DIAGNOSIS — M170 Bilateral primary osteoarthritis of knee: Secondary | ICD-10-CM

## 2020-12-18 DIAGNOSIS — M47816 Spondylosis without myelopathy or radiculopathy, lumbar region: Secondary | ICD-10-CM

## 2020-12-18 DIAGNOSIS — M5459 Other low back pain: Secondary | ICD-10-CM | POA: Diagnosis not present

## 2020-12-18 NOTE — Assessment & Plan Note (Signed)
Synvisc injection #2 of 3 into both knees, return in 1 week for #3 of 3 both knees

## 2020-12-18 NOTE — Progress Notes (Signed)
    Procedures performed today:    Procedure: Real-time Ultrasound Guidedinjection of the left knee Device: Samsung HS60  Verbal informed consent obtained.  Time-out conducted.  Noted no overlying erythema, induration, or other signs of local infection.  Skin prepped in a sterile fashion.  Local anesthesia: Topical Ethyl chloride.  With sterile technique and under real time ultrasound guidance: 1 syringe of Synvisc injected into the suprapatellar recess.   Completed without difficulty  Advised to call if fevers/chills, erythema, induration, drainage, or persistent bleeding.  Images permanently stored and available for review in PACS.  Impression: Technically successful ultrasound guided injection.  Procedure: Real-time Ultrasound Guidedinjection of the right knee Device: Samsung HS60  Verbal informed consent obtained.  Time-out conducted.  Noted no overlying erythema, induration, or other signs of local infection.  Skin prepped in a sterile fashion.  Local anesthesia: Topical Ethyl chloride.  With sterile technique and under real time ultrasound guidance: 1 syringe of Synvisc injected into the suprapatellar recess.   Completed without difficulty  Advised to call if fevers/chills, erythema, induration, drainage, or persistent bleeding.  Images permanently stored and available for review in PACS.  Impression: Technically successful ultrasound guided injection.  Independent interpretation of notes and tests performed by another provider:   None.  Brief History, Exam, Impression, and Recommendations:    Primary osteoarthritis of both knees Synvisc injection #2 of 3 into both knees, return in 1 week for #3 of 3 both knees  Lumbar spondylosis Avarae returns, she did have a good initial response to her facet joint medial branch blocks, it sounds like she is going in for facet RFA today for her bilateral L3-L5 facet joints. I wished her  luck.    ___________________________________________ Gwen Her. Dianah Field, M.D., ABFM., CAQSM. Primary Care and [place] Instructor of [place] of [facility] of Medicine

## 2020-12-18 NOTE — Assessment & Plan Note (Signed)
Michaela Dodson returns, she did have a good initial response to her facet joint medial branch blocks, it sounds like she is going in for facet RFA today for her bilateral L3-L5 facet joints. I wished her luck.

## 2020-12-19 DIAGNOSIS — N390 Urinary tract infection, site not specified: Secondary | ICD-10-CM | POA: Diagnosis not present

## 2020-12-19 DIAGNOSIS — N3941 Urge incontinence: Secondary | ICD-10-CM | POA: Diagnosis not present

## 2020-12-19 DIAGNOSIS — R3129 Other microscopic hematuria: Secondary | ICD-10-CM | POA: Diagnosis not present

## 2020-12-25 ENCOUNTER — Ambulatory Visit (INDEPENDENT_AMBULATORY_CARE_PROVIDER_SITE_OTHER): Payer: BC Managed Care – PPO

## 2020-12-25 ENCOUNTER — Ambulatory Visit (INDEPENDENT_AMBULATORY_CARE_PROVIDER_SITE_OTHER): Payer: BC Managed Care – PPO | Admitting: Sports Medicine

## 2020-12-25 ENCOUNTER — Other Ambulatory Visit: Payer: Self-pay

## 2020-12-25 ENCOUNTER — Encounter: Payer: Self-pay | Admitting: Sports Medicine

## 2020-12-25 DIAGNOSIS — F3132 Bipolar disorder, current episode depressed, moderate: Secondary | ICD-10-CM | POA: Diagnosis not present

## 2020-12-25 DIAGNOSIS — M170 Bilateral primary osteoarthritis of knee: Secondary | ICD-10-CM

## 2020-12-25 DIAGNOSIS — M47816 Spondylosis without myelopathy or radiculopathy, lumbar region: Secondary | ICD-10-CM

## 2020-12-25 NOTE — Assessment & Plan Note (Signed)
About 2 weeks ago we had bumped Michaela Dodson up to 60 mg of Cymbalta. She has now been on this for approximately 6 to 8 weeks, we discontinued her Klonopin. Overall she reports fantastic improvements in her sedation, improvements in her mood, she is no longer tearful, her agoraphobia has improved considerably. She is also aware that she will need to keep a close eye out for any manic symptoms considering currently being in the depressed phase of bipolar disorder. No suicidal homicidal ideation, further management of this per primary care provider.

## 2020-12-25 NOTE — Assessment & Plan Note (Signed)
Michaela Dodson just had her second set of bilateral L3-L5 facet medial branch blocks with good temporary relief as expected, she has not yet had her ablation but I do believe this is going to be scheduled shortly.

## 2020-12-25 NOTE — Progress Notes (Signed)
    Procedures performed today:    Procedure: Real-time Ultrasound Guidedinjection of theleft knee Device: Samsung HS60  Verbal informed consent obtained.  Time-out conducted.  Noted no overlying erythema, induration, or other signs of local infection.  Skin prepped in a sterile fashion.  Local anesthesia: Topical Ethyl chloride.  With sterile technique and under real time ultrasound guidance:1 syringe of Synvisc injected into the suprapatellar recess.  Completed without difficulty  Advised to call if fevers/chills, erythema, induration, drainage, or persistent bleeding.  Images permanently stored and available for review in PACS.  Impression: Technically successful ultrasound guided injection.  Procedure: Real-time Ultrasound Guidedinjection of theright knee Device: Samsung HS60  Verbal informed consent obtained.  Time-out conducted.  Noted no overlying erythema, induration, or other signs of local infection.  Skin prepped in a sterile fashion.  Local anesthesia: Topical Ethyl chloride.  With sterile technique and under real time ultrasound guidance:1 syringe of Synvisc injected into the suprapatellar recess.  Completed without difficulty  Advised to call if fevers/chills, erythema, induration, drainage, or persistent bleeding.  Images permanently stored and available for review in PACS.  Impression: Technically successful ultrasound guided injection.  Independent interpretation of notes and tests performed by another provider:   None.  Brief History, Exam, Impression, and Recommendations:    Primary osteoarthritis of both knees Synvisc injection #3 of 3 into both knees, return in 1 month.  Lumbar spondylosis Michaela Dodson just had her second set of bilateral L3-L5 facet medial branch blocks with good temporary relief as expected, she has not yet had her ablation but I do believe this is going to be scheduled shortly.  Bipolar 1 disorder, depressed, moderate  (HCC) About 2 weeks ago we had bumped Michaela Dodson up to 60 mg of Cymbalta. She has now been on this for approximately 6 to 8 weeks, we discontinued her Klonopin. Overall she reports fantastic improvements in her sedation, improvements in her mood, she is no longer tearful, her agoraphobia has improved considerably. She is also aware that she will need to keep a close eye out for any manic symptoms considering currently being in the depressed phase of bipolar disorder. No suicidal homicidal ideation, further management of this per primary care provider.    ___________________________________________ Michaela Dodson. Michaela Dodson, M.D., ABFM., CAQSM. Primary Care and Sports Medicine [facility]  Adjunct Instructor of Family Medicine  [employer]

## 2020-12-25 NOTE — Assessment & Plan Note (Signed)
Synvisc injection #3 of 3 into both knees, return in 1 month.

## 2021-01-04 ENCOUNTER — Other Ambulatory Visit: Payer: Self-pay | Admitting: Sports Medicine

## 2021-01-04 DIAGNOSIS — F3132 Bipolar disorder, current episode depressed, moderate: Secondary | ICD-10-CM

## 2021-01-12 ENCOUNTER — Other Ambulatory Visit: Payer: Self-pay | Admitting: Sports Medicine

## 2021-01-12 DIAGNOSIS — F3132 Bipolar disorder, current episode depressed, moderate: Secondary | ICD-10-CM

## 2021-01-22 ENCOUNTER — Telehealth (INDEPENDENT_AMBULATORY_CARE_PROVIDER_SITE_OTHER): Payer: BC Managed Care – PPO | Admitting: Sports Medicine

## 2021-01-22 DIAGNOSIS — M47816 Spondylosis without myelopathy or radiculopathy, lumbar region: Secondary | ICD-10-CM

## 2021-01-22 DIAGNOSIS — M170 Bilateral primary osteoarthritis of knee: Secondary | ICD-10-CM

## 2021-01-22 DIAGNOSIS — M5412 Radiculopathy, cervical region: Secondary | ICD-10-CM | POA: Diagnosis not present

## 2021-01-22 MED ORDER — TRAMADOL HCL 50 MG PO TABS: 50.0000 mg | ORAL_TABLET | Freq: Three times a day (TID) | ORAL | 0 refills | Status: DC | PRN

## 2021-01-22 NOTE — Progress Notes (Signed)
   Virtual Visit via WebEx/MyChart   I connected with  Michaela Dodson  on 01/22/21 via WebEx/MyChart/Doximity Video and verified that I am speaking with the correct person using two identifiers.   I discussed the limitations, risks, security and privacy concerns of performing an evaluation and management service by WebEx/MyChart/Doximity Video, including the higher likelihood of inaccurate diagnosis and treatment, and the availability of in person appointments.  We also discussed the likely need of an additional face to face encounter for complete and high quality delivery of care.  I also discussed with the patient that there may be a patient responsible charge related to this service. The patient expressed understanding and wishes to proceed.  Provider location is in medical facility. Patient location is at their home, different from provider location. People involved in care of the patient during this telehealth encounter were myself, my nurse/medical assistant, and my front office/scheduling team member.  Review of Systems: No fevers, chills, night sweats, weight loss, chest pain, or shortness of breath.   Objective Findings:    General: Speaking full sentences, no audible heavy breathing.  Sounds alert and appropriately interactive.  Appears well.  Face symmetric.  Extraocular movements intact.  Pupils equal and round.  No nasal flaring or accessory muscle use visualized.  Independent interpretation of tests performed by another provider:   None.  Brief History, Exam, Impression, and Recommendations:    Lumbar spondylosis Bilateral L3-L5 facet medial branch blocks provided good relief, she is getting set up for her ablation however it looks like her insurance company is going to require formal physical therapy first, she is going to look into whether we can do this as physician directed home physical therapy. In addition Dr. Francesco Runner is out and she is looking to see if another provider  can do her radiofrequency ablation. Refilling tramadol in the meantime.  Primary osteoarthritis of both knees Knees are doing really well after series of Synvisc.  Radiculitis of right cervical region Neck pain, known cervical DDD, C6-C7 with a large anterior osteophyte, with right periscapular burning pain. MRI confirms, proceeding with a right C6-C7 interlaminar epidural with [place] [facility] as Dr. Francesco Runner is out for now. Return to see me 1 month after epidural.   I discussed the above assessment and treatment plan with the patient. The patient was provided an opportunity to ask questions and all were answered. The patient agreed with the plan and demonstrated an understanding of the instructions.   The patient was advised to call back or seek an in-person evaluation if the symptoms worsen or if the condition fails to improve as anticipated.   I provided 30 minutes of face to face and non-face-to-face time during this encounter date, time was needed to gather information, review chart, records, communicate/coordinate with staff remotely, as well as complete documentation.   ___________________________________________ Gwen Her. Dianah Field, M.D., ABFM., CAQSM. Primary Care and [place] Instructor of [place] of [facility]

## 2021-01-22 NOTE — Assessment & Plan Note (Signed)
Knees are doing really well after series of Synvisc.

## 2021-01-22 NOTE — Progress Notes (Signed)
Pretty good with the knees. Back and shoulder issues still.

## 2021-01-22 NOTE — Assessment & Plan Note (Signed)
Bilateral L3-L5 facet medial branch blocks provided good relief, she is getting set up for her ablation however it looks like her insurance company is going to require formal physical therapy first, she is going to look into whether we can do this as physician directed home physical therapy. In addition Dr. Francesco Runner is out and she is looking to see if another provider can do her radiofrequency ablation. Refilling tramadol in the meantime.

## 2021-01-22 NOTE — Assessment & Plan Note (Signed)
Neck pain, known cervical DDD, C6-C7 with a large anterior osteophyte, with right periscapular burning pain. MRI confirms, proceeding with a right C6-C7 interlaminar epidural with [place] [facility] as Dr. Francesco Runner is out for now. Return to see me 1 month after epidural.

## 2021-01-30 ENCOUNTER — Ambulatory Visit
Admission: RE | Admit: 2021-01-30 | Discharge: 2021-01-30 | Disposition: A | Discharge status: Home / Self Care | Payer: BC Managed Care – PPO | Source: Ambulatory Visit | Attending: Sports Medicine | Admitting: Sports Medicine

## 2021-01-30 ENCOUNTER — Other Ambulatory Visit: Payer: Self-pay

## 2021-01-30 DIAGNOSIS — M5412 Radiculopathy, cervical region: Secondary | ICD-10-CM

## 2021-01-30 DIAGNOSIS — M47812 Spondylosis without myelopathy or radiculopathy, cervical region: Secondary | ICD-10-CM | POA: Diagnosis not present

## 2021-01-30 MED ORDER — TRIAMCINOLONE ACETONIDE 40 MG/ML IJ SUSP (RADIOLOGY)
60.0000 mg | Freq: Once | INTRAMUSCULAR | Status: AC
  Administered 2021-01-30: 60 mg via EPIDURAL

## 2021-01-30 MED ORDER — IOPAMIDOL (ISOVUE-M 300) INJECTION 61%
1.0000 mL | Freq: Once | INTRAMUSCULAR | Status: AC
  Administered 2021-01-30: 1 mL via EPIDURAL

## 2021-01-30 NOTE — Discharge Instructions (Signed)

## 2021-02-24 DIAGNOSIS — F3110 Bipolar disorder, current episode manic without psychotic features, unspecified: Secondary | ICD-10-CM | POA: Diagnosis not present

## 2021-03-05 ENCOUNTER — Other Ambulatory Visit: Payer: Self-pay

## 2021-03-05 ENCOUNTER — Ambulatory Visit (INDEPENDENT_AMBULATORY_CARE_PROVIDER_SITE_OTHER): Payer: BC Managed Care – PPO | Admitting: Sports Medicine

## 2021-03-05 ENCOUNTER — Ambulatory Visit (INDEPENDENT_AMBULATORY_CARE_PROVIDER_SITE_OTHER): Payer: BC Managed Care – PPO

## 2021-03-05 DIAGNOSIS — M47816 Spondylosis without myelopathy or radiculopathy, lumbar region: Secondary | ICD-10-CM

## 2021-03-05 DIAGNOSIS — M19011 Primary osteoarthritis, right shoulder: Secondary | ICD-10-CM | POA: Diagnosis not present

## 2021-03-05 NOTE — Assessment & Plan Note (Signed)
This pleasant 50 year old female has L3 L5 facet arthritis, she has a medial branch blocks with good relief, was getting set up for ablation however insurance company is denying it requiring some PT. I am going going order this. She will do sessions approximately monthly with home exercises in between, we will do this for another couple of months and then hopefully Dr. Francesco Runner will be back in action and can get the ablation reapproved.

## 2021-03-05 NOTE — Progress Notes (Signed)
    Procedures performed today:    Procedure: Real-time Ultrasound Guided injection of the right glenohumeral joint Device: Samsung HS60  Verbal informed consent obtained.  Time-out conducted.  Noted no overlying erythema, induration, or other signs of local infection.  Skin prepped in a sterile fashion.  Local anesthesia: Topical Ethyl chloride.  With sterile technique and under real time ultrasound guidance:  Noted minimal arthritic changes, 1 cc Kenalog 40, 2 cc lidocaine, 2 cc bupivacaine injected easily Completed without difficulty  Advised to call if fevers/chills, erythema, induration, drainage, or persistent bleeding.  Images permanently stored and available for review in PACS.  Impression: Technically successful ultrasound guided injection.  Independent interpretation of notes and tests performed by another provider:   None.  Brief History, Exam, Impression, and Recommendations:    Right shoulder glenohumeral labral degenerative changes Michaela Dodson returns, she is a pleasant 50 year old female, chronic right shoulder pain, last injection was done on 02/26/2020 glenohumeral, she has had physical therapy. We did obtain an MRI that showed some labral degeneration. Repeat right glenohumeral injection today, she can see Dr. Griffin Basil if she does not get sufficient relief.  Lumbar spondylosis This pleasant 50 year old female has L3 L5 facet arthritis, she has a medial branch blocks with good relief, was getting set up for ablation however insurance company is denying it requiring some PT. I am going going order this. She will do sessions approximately monthly with home exercises in between, we will do this for another couple of months and then hopefully Dr. Francesco Runner will be back in action and can get the ablation reapproved.    ___________________________________________ Gwen Her. Michaela Dodson, M.D., ABFM., CAQSM. Primary Care and [place] Instructor of [place] of [facility]

## 2021-03-05 NOTE — Assessment & Plan Note (Signed)
Michaela Dodson returns, she is a pleasant 50 year old female, chronic right shoulder pain, last injection was done on 02/26/2020 glenohumeral, she has had physical therapy. We did obtain an MRI that showed some labral degeneration. Repeat right glenohumeral injection today, she can see Dr. Griffin Basil if she does not get sufficient relief.

## 2021-03-07 ENCOUNTER — Other Ambulatory Visit: Payer: Self-pay

## 2021-03-07 ENCOUNTER — Ambulatory Visit (INDEPENDENT_AMBULATORY_CARE_PROVIDER_SITE_OTHER): Payer: BC Managed Care – PPO | Admitting: Physical Therapy

## 2021-03-07 ENCOUNTER — Encounter: Payer: Self-pay | Admitting: Physical Therapy

## 2021-03-07 DIAGNOSIS — R6889 Other general symptoms and signs: Secondary | ICD-10-CM

## 2021-03-07 DIAGNOSIS — G8929 Other chronic pain: Secondary | ICD-10-CM | POA: Diagnosis not present

## 2021-03-07 DIAGNOSIS — M6281 Muscle weakness (generalized): Secondary | ICD-10-CM

## 2021-03-07 DIAGNOSIS — M545 Low back pain, unspecified: Secondary | ICD-10-CM

## 2021-03-07 NOTE — Therapy (Signed)
[facility] [place] [address] [place], [place], [address] Phone: [phone]   Fax:  [phone]  Physical Therapy Evaluation  Patient Details  Name: Michaela Dodson MRN: [id number] Date of Birth: Dec 14, 1971 Referring Provider (PT): thekkekandam   Encounter Date: 03/07/2021   PT End of Session - 03/07/21 0925    Visit Number 1    Number of Visits 12    Date for PT Re-Evaluation 05/30/21    PT Start Time 0850    PT Stop Time 0926    PT Time Calculation (min) 36 min    Activity Tolerance Patient tolerated treatment well    Behavior During Therapy [facility] for tasks assessed/performed           Past Medical History:  Diagnosis Date  . Allergy   . Depression   . GERD (gastroesophageal reflux disease)    Pt states that this has gone away since her recent weight loss  . H/O thromboembolism    superficial-left medial thigh lipoma, vericose veins  . Obesity    Pt has lost the weight and is no longer obese    Past Surgical History:  Procedure Laterality Date  . BREAST BIOPSY    . CARPAL TUNNEL RELEASE Right 09/2012   Dr. Orlan Leavens  . CARPAL TUNNEL RELEASE Left 11/2013   Dr. Orlan Leavens  . TUBAL LIGATION    . uterine ablation      There were no vitals filed for this visit.    Subjective Assessment - 03/07/21 0852    Subjective Pt has been having low back pain for years, mostly concentrated on the Right side. Pain increases with lifting, bending, reaching. Pain is relieved when laying down with feet elevated. Pt has been out of work since 12/21 due to back pain. Pt plans to have an ablation but is required to have PT first. Pt has had PT a few years ago but has not had an ablation.    Limitations Lifting;Standing;House hold activities    How long can you stand comfortably? 30 minutes    How long can you walk comfortably? 30 minutes    Diagnostic tests x rays show facet arthritis    Patient Stated Goals decrease pain     Currently in Pain? Yes    Pain Score 4     Pain Location Back    Pain Orientation Right;Lower    Pain Descriptors / Indicators Aching    Pain Type Chronic pain    Pain Onset More than a month ago    Pain Frequency Constant    Aggravating Factors  stand, lift, bend, reach    Pain Relieving Factors decompression    Effect of Pain on Daily Activities unable to work, difficulty with housework, IADLs              [facility] PT Assessment - 03/07/21 0001      Assessment   Medical Diagnosis L3/L5 facet arthritis    Referring Provider (PT) [email or web address]      Balance Screen   Has the patient fallen in the past 6 months No      Prior Function   Level of Independence Independent      ROM / Strength   AROM / PROM / Strength AROM;Strength      AROM   AROM Assessment Site Lumbar    Lumbar Flexion limited 50% pain    Lumbar Extension limited 25% pain    Lumbar - Right Side Bend WFL pain  Lumbar - Left Side Bend WFL    Lumbar - Right Rotation [facility]    Lumbar - Left Rotation [facility]      Strength   Strength Assessment Site Hip    Right/Left Hip Right;Left    Right Hip Flexion 3/5    Right Hip ABduction 4+/5    Left Hip Flexion 3/5    Left Hip ABduction 4/5      Flexibility   Soft Tissue Assessment /Muscle Length yes    Hamstrings decreased bilat    Piriformis decreased Lt > Rt      Palpation   Spinal mobility TTP with L3 CPAs, decreased mobility    Palpation comment increased mm spasticity lumbar paraspinals      Special Tests    Special Tests Lumbar    Lumbar Tests FABER test;Straight Leg Raise      FABER test   findings Negative    Side --   bilat     Straight Leg Raise   Findings Negative    Side  --   bilat                     Objective measurements completed on examination: See above findings.       [facility] Adult PT Treatment/Exercise - 03/07/21 0001      Exercises   Exercises Lumbar      Lumbar Exercises: Stretches   Piriformis Stretch  Left;Right;2 reps;30 seconds      Lumbar Exercises: Supine   Pelvic Tilt 10 reps;5 seconds    Straight Leg Raise 20 reps    Straight Leg Raises Limitations supine and prone bilat                  PT Education - 03/07/21 0923    Education Details PT POC and goals, HEP    Person(s) Educated Patient    Methods Explanation;Demonstration;Handout    Comprehension Returned demonstration;Verbalized understanding               PT Long Term Goals - 03/07/21 0934      PT LONG TERM GOAL #1   Title Pt will be independent with HEP    Time 12    Period Weeks    Status New    Target Date 05/30/21      PT LONG TERM GOAL #2   Title Pt will improve bilat LE strength to 4+/5 to perform IADLs without pain    Time 12    Period Weeks    Status New    Target Date 05/30/21      PT LONG TERM GOAL #3   Title Pt will improve lumbar flexion ROM by 25% to perform bending and reaching activities with decreased pain    Time 12    Period Weeks    Status New    Target Date 05/30/21                  Plan - 03/07/21 0927    Clinical Impression Statement Pt presents with increased back pain, decreased strength, ROM and increased mm spasticity. Pt with decreased ability to perform IADLs and functional mobility. Pt will benefit from skilled PT to address deficits and improve functional mobility    Personal Factors and Comorbidities Past/Current Experience;Time since onset of injury/illness/exacerbation    Examination-Activity Limitations Lift;Locomotion Level;Reach Overhead;Carry;Stand;Squat    Examination-Participation Restrictions Laundry;Cleaning;Shop;Occupation;Yard Work    Stability/Clinical Decision Making Stable/Uncomplicated    Optometrist Low  Rehab Potential Good    PT Frequency 1x / week    PT Duration 12 weeks    PT Treatment/Interventions Aquatic Therapy;Cryotherapy;Moist Heat;Iontophoresis 4mg /ml Dexamethasone;Electrical Stimulation;Neuromuscular  re-education;Therapeutic exercise;Therapeutic activities;Patient/family education;Manual techniques;Taping;Dry needling;Passive range of motion    PT Next Visit Plan assess and progress HEP    PT Home Exercise Plan Access Code: [email or web address]    Consulted and Agree with Plan of Care Patient           Patient will benefit from skilled therapeutic intervention in order to improve the following deficits and impairments:  Pain,Impaired flexibility,Decreased strength,Decreased activity tolerance,Decreased range of motion,Increased muscle spasms,Hypomobility,Difficulty walking,Decreased endurance  Visit Diagnosis: Chronic right-sided low back pain without sciatica - Plan: PT plan of care cert/re-cert  Muscle weakness (generalized) - Plan: PT plan of care cert/re-cert  Decreased activity tolerance - Plan: PT plan of care cert/re-cert     Problem List Patient Active Problem List   Diagnosis Date Noted  . Right shoulder glenohumeral labral degenerative changes 09/18/2019  . Family history of breast cancer in mother 09/06/2018  . Varicose veins of left leg with edema 06/06/2018  . Hyperbilirubinemia, suspect [place] Syndrome 09/16/2017  . Postgastrectomy malabsorption 09/16/2017  . Lipomatosis 04/27/2017  . Bipolar 1 disorder, depressed, moderate (HCC) 03/29/2017  . Primary osteoarthritis of both knees 10/14/2016  . History of sleeve gastrectomy 09/18/2015  . Radiculitis of right cervical region 09/16/2015  . Acute superficial venous thrombosis of left lower extremity 09/16/2015  . Former smoker 07/03/2015  . Acid reflux 04/11/2015  . H/O renal calculi 04/11/2015  . Hypercholesterolemia without hypertriglyceridemia 04/11/2015  . Disordered sleep 04/11/2015  . Snores 04/11/2015  . Lumbar spondylosis 03/06/2015  . Microscopic hematuria 06/06/2013  . Carpal tunnel syndrome 04/04/2012  . URINARY INCONTINENCE, URGE 09/24/2008  . ASTHMA, INTERMITTENT, MILD 02/06/2008  . DEPRESSION, SEVERE  08/25/2007   Timika Muench, PT  [place] 03/07/2021, 9:39 AM  [facility] [address] [place] [address] [place], [place], [address] Phone: [phone]   Fax:  [phone]  Name: ELMIRA SCHRANZ MRN: [id number] Date of Birth: 10-Apr-1971

## 2021-03-07 NOTE — Patient Instructions (Signed)
Access Code: [email or web address] URL: [email or web address] Date: 03/07/2021 Prepared by: [name]  Exercises Supine Posterior Pelvic Tilt - 1 x daily - 7 x weekly - 2 sets - 10 reps - 5 seconds hold Supine March with Posterior Pelvic Tilt - 1 x daily - 7 x weekly - 3 sets - 10 reps Hooklying Heel Slide - 1 x daily - 7 x weekly - 3 sets - 10 reps Supine Piriformis Stretch with Leg Straight - 1 x daily - 7 x weekly - 3 sets - 1 reps - 20-30 seconds hold Supine Active Straight Leg Raise - 1 x daily - 7 x weekly - 3 sets - 10 reps Prone Hip Extension - 1 x daily - 7 x weekly - 3 sets - 10 reps

## 2021-03-11 ENCOUNTER — Encounter: Payer: Self-pay | Admitting: Family Medicine

## 2021-03-11 DIAGNOSIS — R300 Dysuria: Secondary | ICD-10-CM | POA: Diagnosis not present

## 2021-03-19 ENCOUNTER — Other Ambulatory Visit: Payer: Self-pay

## 2021-03-19 ENCOUNTER — Ambulatory Visit (INDEPENDENT_AMBULATORY_CARE_PROVIDER_SITE_OTHER): Payer: BC Managed Care – PPO | Admitting: Physical Therapy

## 2021-03-19 DIAGNOSIS — M6281 Muscle weakness (generalized): Secondary | ICD-10-CM | POA: Diagnosis not present

## 2021-03-19 DIAGNOSIS — R6889 Other general symptoms and signs: Secondary | ICD-10-CM

## 2021-03-19 DIAGNOSIS — M545 Low back pain, unspecified: Secondary | ICD-10-CM

## 2021-03-19 DIAGNOSIS — R293 Abnormal posture: Secondary | ICD-10-CM

## 2021-03-19 DIAGNOSIS — G8929 Other chronic pain: Secondary | ICD-10-CM

## 2021-03-19 DIAGNOSIS — M25511 Pain in right shoulder: Secondary | ICD-10-CM

## 2021-03-19 NOTE — Therapy (Signed)
[place], [place], [address] Phone: [phone]   Fax:  [phone]  Physical Therapy Treatment  Patient Details  Name: Michaela Dodson MRN: [id number] Date of Birth: 1971/10/09 Referring Provider (PT): thekkekandam   Encounter Date: 03/19/2021   PT End of Session - 03/19/21 0921    Visit Number 2    Number of Visits 12    Date for PT Re-Evaluation 05/30/21    PT Start Time 0845    PT Stop Time 0923    PT Time Calculation (min) 38 min    Activity Tolerance Patient tolerated treatment well    Behavior During Therapy [facility] for tasks assessed/performed           Past Medical History:  Diagnosis Date  . Allergy   . Depression   . GERD (gastroesophageal reflux disease)    Pt states that this has gone away since her recent weight loss  . H/O thromboembolism    superficial-left medial thigh lipoma, vericose veins  . Obesity    Pt has lost the weight and is no longer obese    Past Surgical History:  Procedure Laterality Date  . BREAST BIOPSY    . CARPAL TUNNEL RELEASE Right 09/2012   Dr. Apolonio Schneiders  . CARPAL TUNNEL RELEASE Left 11/2013   Dr. Apolonio Schneiders  . TUBAL LIGATION    . uterine ablation      There were no vitals filed for this visit.   Subjective Assessment - 03/19/21 0850    Subjective pt states she has been "down the last 2 days" due to doing a lot of housework. She states she has had to use the heating pad a lot    Patient Stated Goals decrease pain    Currently in Pain? Yes    Pain Score 6     Pain Location Back    Pain Orientation Right;Lower    Pain Descriptors / Indicators Aching                             [facility] Adult PT Treatment/Exercise - 03/19/21 0001      Lumbar Exercises: Stretches   Piriformis Stretch Right;Left;2 reps;30 seconds      Lumbar Exercises: Supine   Pelvic Tilt 10 reps;5 seconds      Manual Therapy   Manual Therapy Joint  mobilization;Soft tissue mobilization;Manual Traction    Joint Mobilization SIJ mobs to decrease pain and improve mobility grade 2-3 PA    Soft tissue mobilization STM lumbar paraspinals to reduce mm spasticity    Manual Traction manual lumbar traction 1 minute x 5                       PT Long Term Goals - 03/07/21 0934      PT LONG TERM GOAL #1   Title Pt will be independent with HEP    Time 12    Period Weeks    Status New    Target Date 05/30/21      PT LONG TERM GOAL #2   Title Pt will improve bilat LE strength to 4+/5 to perform IADLs without pain    Time 12    Period Weeks    Status New    Target Date 05/30/21      PT LONG TERM GOAL #3   Title Pt will improve lumbar flexion ROM by 25% to perform bending  and reaching activities with decreased pain    Time 12    Period Weeks    Status New    Target Date 05/30/21                 Plan - 03/19/21 5885    Clinical Impression Statement pt continues with significant pain in Rt low back. Pt responds well to manual therapy including manual traction and STM. PT educated pt on positioning to decrease pain and recommendation to use home TENs to reduce pain    PT Next Visit Plan assess and progress HEP, manual as indicated    PT Home Exercise Plan Access Code: [email or web address]    Consulted and Agree with Plan of Care Patient           Patient will benefit from skilled therapeutic intervention in order to improve the following deficits and impairments:     Visit Diagnosis: Chronic right-sided low back pain without sciatica  Muscle weakness (generalized)  Decreased activity tolerance  Acute pain of right shoulder  Abnormal posture     Problem List Patient Active Problem List   Diagnosis Date Noted  . Right shoulder glenohumeral labral degenerative changes 09/18/2019  . Family history of breast cancer in mother 09/06/2018  . Varicose veins of left leg with edema 06/06/2018  . Hyperbilirubinemia,  suspect Rosanna Dodson Syndrome 09/16/2017  . Postgastrectomy malabsorption 09/16/2017  . Lipomatosis 04/27/2017  . Bipolar 1 disorder, depressed, moderate ([place]) 03/29/2017  . Primary osteoarthritis of both knees 10/14/2016  . History of sleeve gastrectomy 09/18/2015  . Radiculitis of right cervical region 09/16/2015  . Acute superficial venous thrombosis of left lower extremity 09/16/2015  . Former smoker 07/03/2015  . Acid reflux 04/11/2015  . H/O renal calculi 04/11/2015  . Hypercholesterolemia without hypertriglyceridemia 04/11/2015  . Disordered sleep 04/11/2015  . Snores 04/11/2015  . Lumbar spondylosis 03/06/2015  . Microscopic hematuria 06/06/2013  . Carpal tunnel syndrome 04/04/2012  . URINARY INCONTINENCE, URGE 09/24/2008  . ASTHMA, INTERMITTENT, MILD 02/06/2008  . DEPRESSION, SEVERE 08/25/2007   [name], PT  [facility] 03/19/2021, 9:33 AM  [facility] [place], [place], [address] Phone: [phone]   Fax:  [phone]  Name: [name] MRN: [id number] Date of Birth: 28-Feb-1971

## 2021-03-20 ENCOUNTER — Ambulatory Visit (INDEPENDENT_AMBULATORY_CARE_PROVIDER_SITE_OTHER): Payer: BC Managed Care – PPO | Admitting: Family Medicine

## 2021-03-20 ENCOUNTER — Other Ambulatory Visit: Payer: Self-pay | Admitting: Family Medicine

## 2021-03-20 ENCOUNTER — Encounter: Payer: Self-pay | Admitting: Family Medicine

## 2021-03-20 VITALS — BP 120/62 | HR 102 | Ht 66.0 in | Wt 164.0 lb

## 2021-03-20 DIAGNOSIS — Z0000 Encounter for general adult medical examination without abnormal findings: Secondary | ICD-10-CM

## 2021-03-20 DIAGNOSIS — Z1159 Encounter for screening for other viral diseases: Secondary | ICD-10-CM

## 2021-03-20 DIAGNOSIS — N3000 Acute cystitis without hematuria: Secondary | ICD-10-CM | POA: Diagnosis not present

## 2021-03-20 DIAGNOSIS — Z1211 Encounter for screening for malignant neoplasm of colon: Secondary | ICD-10-CM

## 2021-03-20 DIAGNOSIS — Z1231 Encounter for screening mammogram for malignant neoplasm of breast: Secondary | ICD-10-CM

## 2021-03-20 NOTE — Patient Instructions (Signed)
Preventive Care 84-50 Years Old, Female Preventive care refers to lifestyle choices and visits with your health care provider that can promote health and wellness. This includes:  A yearly physical exam. This is also called an annual wellness visit.  Regular dental and eye exams.  Immunizations.  Screening for certain conditions.  Healthy lifestyle choices, such as: ? Eating a healthy diet. ? Getting regular exercise. ? Not using drugs or products that contain nicotine and tobacco. ? Limiting alcohol use. What can I expect for my preventive care visit? Physical exam Your health care provider will check your:  Height and weight. These may be used to calculate your BMI (body mass index). BMI is a measurement that tells if you are at a healthy weight.  Heart rate and blood pressure.  Body temperature.  Skin for abnormal spots. Counseling Your health care provider may ask you questions about your:  Past medical problems.  Family's medical history.  Alcohol, tobacco, and drug use.  Emotional well-being.  Home life and relationship well-being.  Sexual activity.  Diet, exercise, and sleep habits.  Work and work Statistician.  Access to firearms.  Method of birth control.  Menstrual cycle.  Pregnancy history. What immunizations do I need? Vaccines are usually given at various ages, according to a schedule. Your health care provider will recommend vaccines for you based on your age, medical history, and lifestyle or other factors, such as travel or where you work.   What tests do I need? Blood tests  Lipid and cholesterol levels. These may be checked every 5 years, or more often if you are over 3 years old.  Hepatitis C test.  Hepatitis B test. Screening  Lung cancer screening. You may have this screening every year starting at age 73 if you have a 30-pack-year history of smoking and currently smoke or have quit within the past 15 years.  Colorectal cancer  screening. ? All adults should have this screening starting at age 52 and continuing until age 17. ? Your health care provider may recommend screening at age 49 if you are at increased risk. ? You will have tests every 1-10 years, depending on your results and the type of screening test.  Diabetes screening. ? This is done by checking your blood sugar (glucose) after you have not eaten for a while (fasting). ? You may have this done every 1-3 years.  Mammogram. ? This may be done every 1-2 years. ? Talk with your health care provider about when you should start having regular mammograms. This may depend on whether you have a family history of breast cancer.  BRCA-related cancer screening. This may be done if you have a family history of breast, ovarian, tubal, or peritoneal cancers.  Pelvic exam and Pap test. ? This may be done every 3 years starting at age 10. ? Starting at age 11, this may be done every 5 years if you have a Pap test in combination with an HPV test. Other tests  STD (sexually transmitted disease) testing, if you are at risk.  Bone density scan. This is done to screen for osteoporosis. You may have this scan if you are at high risk for osteoporosis. Talk with your health care provider about your test results, treatment options, and if necessary, the need for more tests. Follow these instructions at home: Eating and drinking  Eat a diet that includes fresh fruits and vegetables, whole grains, lean protein, and low-fat dairy products.  Take vitamin and mineral supplements  as recommended by your health care provider.  Do not drink alcohol if: ? Your health care provider tells you not to drink. ? You are pregnant, may be pregnant, or are planning to become pregnant.  If you drink alcohol: ? Limit how much you have to 0-1 drink a day. ? Be aware of how much alcohol is in your drink. In the [place] one drink equals one 12 oz bottle of beer (355 mL), one 5 oz glass of  wine (148 mL), or one 1 oz glass of hard liquor (44 mL).   Lifestyle  Take daily care of your teeth and gums. Brush your teeth every morning and night with fluoride toothpaste. Floss one time each day.  Stay active. Exercise for at least 30 minutes 5 or more days each week.  Do not use any products that contain nicotine or tobacco, such as cigarettes, e-cigarettes, and chewing tobacco. If you need help quitting, ask your health care provider.  Do not use drugs.  If you are sexually active, practice safe sex. Use a condom or other form of protection to prevent STIs (sexually transmitted infections).  If you do not wish to become pregnant, use a form of birth control. If you plan to become pregnant, see your health care provider for a prepregnancy visit.  If told by your health care provider, take low-dose aspirin daily starting at age 50.  Find healthy ways to cope with stress, such as: ? Meditation, yoga, or listening to music. ? Journaling. ? Talking to a trusted person. ? Spending time with friends and family. Safety  Always wear your seat belt while driving or riding in a vehicle.  Do not drive: ? If you have been drinking alcohol. Do not ride with someone who has been drinking. ? When you are tired or distracted. ? While texting.  Wear a helmet and other protective equipment during sports activities.  If you have firearms in your house, make sure you follow all gun safety procedures. What's next?  Visit your health care provider once a year for an annual wellness visit.  Ask your health care provider how often you should have your eyes and teeth checked.  Stay up to date on all vaccines. This information is not intended to replace advice given to you by your health care provider. Make sure you discuss any questions you have with your health care provider. Document Revised: 09/17/2020 Document Reviewed: 08/25/2018 Elsevier Patient Education  2021 Elsevier Inc.  

## 2021-03-20 NOTE — Progress Notes (Signed)
Established Patient Office Visit  Subjective:  Patient ID: Michaela Dodson, female    DOB: 03-07-1971  Age: 50 y.o. MRN: [id number]  CC:  Chief Complaint  Patient presents with  . Annual Exam    HPI Michaela Dodson presents for CPE.  Overall she is doing okay.  Some of her psychiatric medications are currently being adjusted and weaned because they can no longer get the prescription covered that she has been on for the last decade.  She is a little bit upset about it but is really working with her psychiatrist to find an alternative option.  She has been staying active with her grandchildren.  He is due for her mammogram this month.  Reports she is currently on antibiotics right now, possibly Bactrim, for urinary tract infection being treated by her urologist.  She would like to have a repeat urine culture today just to make sure that it is clearing up.  Past Medical History:  Diagnosis Date  . Allergy   . Depression   . GERD (gastroesophageal reflux disease)    Pt states that this has gone away since her recent weight loss  . H/O thromboembolism    superficial-left medial thigh lipoma, vericose veins  . Obesity    Pt has lost the weight and is no longer obese    Past Surgical History:  Procedure Laterality Date  . BREAST BIOPSY    . CARPAL TUNNEL RELEASE Right 09/2012   Dr. Apolonio Schneiders  . CARPAL TUNNEL RELEASE Left 11/2013   Dr. Apolonio Schneiders  . TUBAL LIGATION    . uterine ablation      Family History  Problem Relation Age of Onset  . Breast cancer Mother   . Bladder Cancer Mother   . Colon cancer Maternal Grandmother     Social History   Socioeconomic History  . Marital status: Married    Spouse name: Not on file  . Number of children: Not on file  . Years of education: Not on file  . Highest education level: Not on file  Occupational History  . Not on file  Tobacco Use  . Smoking status: Former Smoker    Types: Cigarettes    Quit date: 07/18/2015    Years since  quitting: 5.6  . Smokeless tobacco: Never Used  . Tobacco comment: e-cigs  Substance and Sexual Activity  . Alcohol use: Yes  . Drug use: No  . Sexual activity: Yes    Birth control/protection: None  Other Topics Concern  . Not on file  Social History Narrative  . Not on file   Social Determinants of Health   Financial Resource Strain: Not on file  Food Insecurity: Not on file  Transportation Needs: Not on file  Physical Activity: Not on file  Stress: Not on file  Social Connections: Not on file  Intimate Partner Violence: Not on file    Outpatient Medications Prior to Visit  Medication Sig Dispense Refill  . ADDERALL XR 30 MG 24 hr capsule Take 30 mg by mouth every morning.    . Cyanocobalamin (B-12 PO) Take by mouth.    . DULoxetine (CYMBALTA) 60 MG capsule Take 1 capsule (60 mg total) by mouth daily. 90 capsule 3  . Hylan (SYNVISC) 16 MG/2ML SOSY Inject 1 syringe into each knee weekly x3 12 mL 3  . LAMICTAL 150 MG tablet Take 150 mg by mouth daily.  2  . risperiDONE (RISPERDAL) 3 MG tablet Take 1 tablet by mouth daily.    Marland Kitchen  Trospium Chloride 60 MG CP24 Take by mouth.    . traMADol (ULTRAM) 50 MG tablet Take 1-2 tablets (50-100 mg total) by mouth every 8 (eight) hours as needed for moderate pain. Maximum 6 tabs per day. 21 tablet 0   No facility-administered medications prior to visit.    No Known Allergies  ROS Review of Systems    Objective:    Physical Exam  BP 120/62   Pulse (!) 102   Ht 5\' 6"  (1.676 m)   Wt 164 lb (74.4 kg)   SpO2 97%   BMI 26.47 kg/m  Wt Readings from Last 3 Encounters:  03/20/21 164 lb (74.4 kg)  02/06/20 154 lb (69.9 kg)  09/18/19 165 lb (74.8 kg)     Health Maintenance Due  Topic Date Due  . Hepatitis C Screening  Never done  . COLONOSCOPY (Pts 45-55yrs Insurance coverage will need to be confirmed)  Never done    There are no preventive care reminders to display for this patient.  Lab Results  Component Value Date    TSH 1.19 09/06/2018   Lab Results  Component Value Date   WBC 4.6 01/02/2019   HGB 13.1 01/02/2019   HCT 37.7 01/02/2019   MCV 95.0 01/02/2019   PLT 208 01/02/2019   Lab Results  Component Value Date   NA 140 01/02/2019   K 4.2 01/02/2019   CO2 27 01/02/2019   GLUCOSE 96 01/02/2019   BUN 13 01/02/2019   CREATININE 0.79 01/02/2019   BILITOT 2.2 (H) 01/02/2019   ALKPHOS 49 03/29/2017   AST 17 01/02/2019   ALT 17 01/02/2019   PROT 6.5 01/02/2019   ALBUMIN 4.0 03/29/2017   CALCIUM 9.6 01/02/2019   Lab Results  Component Value Date   CHOL 209 (H) 01/02/2019   Lab Results  Component Value Date   HDL 78 01/02/2019   Lab Results  Component Value Date   LDLCALC 110 (H) 01/02/2019   Lab Results  Component Value Date   TRIG 104 01/02/2019   Lab Results  Component Value Date   CHOLHDL 2.7 01/02/2019   No results found for: HGBA1C    Assessment & Plan:   Problem List Items Addressed This Visit   None   Visit Diagnoses    Wellness examination    -  Primary   Relevant Orders   CBC   COMPLETE METABOLIC PANEL WITH GFR   Lipid panel   Hepatitis C antibody   Need for hepatitis C screening test       Relevant Orders   Hepatitis C antibody   Screening for colon cancer       Relevant Orders   Ambulatory referral to Gastroenterology   Screening mammogram for breast cancer       Relevant Orders   MM 3D SCREEN BREAST BILATERAL   Acute cystitis without hematuria       Relevant Orders   Urine Culture     Keep up a regular exercise program and make sure you are eating a healthy diet Try to eat 4 servings of dairy a day, or if you are lactose intolerant take a calcium with vitamin D daily.  Your vaccines are up to date.  Get updated lab work done today. Her for mammogram placed today.  She is also due for colon cancer screening.  Referral placed.   No orders of the defined types were placed in this encounter.   Follow-up: No follow-ups on file.    Barnetta Chapel  Madilyn Fireman, MD

## 2021-03-21 LAB — LIPID PANEL
Cholesterol: 181 mg/dL (ref <200)
HDL: 72 mg/dL (ref >=50)
LDL Cholesterol (Calc): 91 mg/dL (calc)
Non-HDL Cholesterol (Calc): 109 mg/dL (calc) (ref <130)
Total CHOL/HDL Ratio: 2.5 (calc) (ref <5.0)
Triglycerides: 86 mg/dL (ref <150)

## 2021-03-21 LAB — CBC
HCT: 41.9 % (ref 35.0–45.0)
Hemoglobin: 14 g/dL (ref 11.7–15.5)
MCH: 31.7 pg (ref 27.0–33.0)
MCHC: 33.4 g/dL (ref 32.0–36.0)
MCV: 95 fL (ref 80.0–100.0)
MPV: 11.2 fL (ref 7.5–12.5)
Platelets: 201 10*3/uL (ref 140–400)
RBC: 4.41 10*6/uL (ref 3.80–5.10)
RDW: 11.7 % (ref 11.0–15.0)
WBC: 6.1 10*3/uL (ref 3.8–10.8)

## 2021-03-21 LAB — COMPLETE METABOLIC PANEL WITH GFR
AG Ratio: 2.1 (calc) (ref 1.0–2.5)
ALT: 11 U/L (ref 6–29)
AST: 11 U/L (ref 10–35)
Albumin: 4.4 g/dL (ref 3.6–5.1)
Alkaline phosphatase (APISO): 43 U/L (ref 37–153)
BUN: 11 mg/dL (ref 7–25)
CO2: 29 mmol/L (ref 20–32)
Calcium: 9.3 mg/dL (ref 8.6–10.4)
Chloride: 104 mmol/L (ref 98–110)
Creat: 0.72 mg/dL (ref 0.50–1.05)
GFR, Est African American: 113 mL/min/{1.73_m2} (ref >=60)
GFR, Est Non African American: 98 mL/min/{1.73_m2} (ref >=60)
Globulin: 2.1 g/dL (calc) (ref 1.9–3.7)
Glucose, Bld: 94 mg/dL (ref 65–99)
Potassium: 4.1 mmol/L (ref 3.5–5.3)
Sodium: 141 mmol/L (ref 135–146)
Total Bilirubin: 1.1 mg/dL (ref 0.2–1.2)
Total Protein: 6.5 g/dL (ref 6.1–8.1)

## 2021-03-21 LAB — HEPATITIS C ANTIBODY
Hepatitis C Ab: NONREACTIVE
SIGNAL TO CUT-OFF: 0.01 (ref <1.00)

## 2021-03-24 LAB — URINE CULTURE
MICRO NUMBER:: 11690897
Result:: NO GROWTH
SPECIMEN QUALITY:: ADEQUATE

## 2021-03-24 LAB — LIPID PANEL

## 2021-03-24 LAB — HEPATITIS C ANTIBODY

## 2021-03-24 LAB — COMPLETE METABOLIC PANEL WITH GFR

## 2021-03-24 LAB — CBC

## 2021-03-26 ENCOUNTER — Encounter: Payer: BC Managed Care – PPO | Admitting: Family Medicine

## 2021-03-31 ENCOUNTER — Encounter: Payer: Self-pay | Admitting: Rehabilitative and Restorative Service Providers"

## 2021-03-31 ENCOUNTER — Other Ambulatory Visit: Payer: Self-pay

## 2021-03-31 ENCOUNTER — Ambulatory Visit (INDEPENDENT_AMBULATORY_CARE_PROVIDER_SITE_OTHER): Payer: BC Managed Care – PPO | Admitting: Rehabilitative and Restorative Service Providers"

## 2021-03-31 DIAGNOSIS — M6281 Muscle weakness (generalized): Secondary | ICD-10-CM | POA: Diagnosis not present

## 2021-03-31 DIAGNOSIS — R6889 Other general symptoms and signs: Secondary | ICD-10-CM | POA: Diagnosis not present

## 2021-03-31 DIAGNOSIS — G8929 Other chronic pain: Secondary | ICD-10-CM | POA: Diagnosis not present

## 2021-03-31 DIAGNOSIS — M545 Low back pain, unspecified: Secondary | ICD-10-CM | POA: Diagnosis not present

## 2021-03-31 NOTE — Therapy (Signed)
[facility] [place], [place], [address] Phone: [phone]   Fax:  [phone]  Physical Therapy Treatment  Patient Details  Name: Michaela Dodson MRN: [id number] Date of Birth: 1971/06/01 Referring Provider (PT): [email or web address]   Encounter Date: 03/31/2021   PT End of Session - 03/31/21 0808    Visit Number 3    Number of Visits 12    Date for PT Re-Evaluation 05/30/21    PT Start Time 0805    PT Stop Time 0845    PT Time Calculation (min) 40 min    Activity Tolerance Patient tolerated treatment well    Behavior During Therapy [facility] for tasks assessed/performed           Past Medical History:  Diagnosis Date  . Allergy   . Depression   . GERD (gastroesophageal reflux disease)    Pt states that this has gone away since her recent weight loss  . H/O thromboembolism    superficial-left medial thigh lipoma, vericose veins  . Obesity    Pt has lost the weight and is no longer obese    Past Surgical History:  Procedure Laterality Date  . BREAST BIOPSY    . CARPAL TUNNEL RELEASE Right 09/2012   Dr. Apolonio Schneiders  . CARPAL TUNNEL RELEASE Left 11/2013   Dr. Apolonio Schneiders  . TUBAL LIGATION    . uterine ablation      There were no vitals filed for this visit.   Subjective Assessment - 03/31/21 0806    Subjective The patient reports she has been taking prednisone and this is helping low back pain.  She does't want to do anything to flare up pain today.    Patient Stated Goals decrease pain    Currently in Pain? Yes    Pain Score 3     Pain Location Back    Pain Orientation Right;Lower    Pain Descriptors / Indicators Aching    Pain Type Chronic pain    Pain Onset More than a month ago    Aggravating Factors  activity    Pain Relieving Factors decompression              [facility] PT Assessment - 03/31/21 0812      Assessment   Medical Diagnosis L3/L5 facet arthritis    Referring Provider (PT) [email or web address]                          [facility] Adult PT Treatment/Exercise - 03/31/21 0812      Exercises   Exercises Lumbar      Lumbar Exercises: Stretches   Single Knee to Chest Stretch Right;Left;1 rep;20 seconds    Lower Trunk Rotation 1 rep;30 seconds    Other Lumbar Stretch Exercise standing QL stretch in doorframe      Lumbar Exercises: Standing   Other Standing Lumbar Exercises wall plank      Lumbar Exercises: Supine   Pelvic Tilt 5 reps    Bent Knee Raise 5 reps    Isometric Hip Flexion 5 reps;5 seconds    Isometric Hip Flexion Limitations L side feels weaker during this task    Large Ball Oblique Isometric 5 reps    Large Ball Oblique Isometric Limitations within tolerable ROM    Other Supine Lumbar Exercises TA contractions with bent knee fallout      Lumbar Exercises: Sidelying   Other Sidelying Lumbar Exercises hip hike and depression x 10  reps bilaterally working on QL lengthening      Manual Therapy   Manual Therapy Joint mobilization;Soft tissue mobilization;Passive ROM    Manual therapy comments to improve spinal mobility and decrease pain    Joint Mobilization CPA PA mobs grade II-III lumbar spine    Soft tissue mobilization STM to R and L QL and paraspinal musculature    Passive ROM prone passive hip IR/ER                  PT Education - 03/31/21 1303    Education Details HEP progression    Person(s) Educated Patient    Methods Explanation;Demonstration;Handout    Comprehension Returned demonstration;Verbalized understanding               PT Long Term Goals - 03/07/21 0934      PT LONG TERM GOAL #1   Title Pt will be independent with HEP    Time 12    Period Weeks    Status New    Target Date 05/30/21      PT LONG TERM GOAL #2   Title Pt will improve bilat LE strength to 4+/5 to perform IADLs without pain    Time 12    Period Weeks    Status New    Target Date 05/30/21      PT LONG TERM GOAL #3   Title Pt will improve lumbar  flexion ROM by 25% to perform bending and reaching activities with decreased pain    Time 12    Period Weeks    Status New    Target Date 05/30/21                 Plan - 03/31/21 0824    Clinical Impression Statement The patient feels improvement this week, which she attributes to use of steroids she took for a couple of days to reduce pain.  She tolerated ther ex well today.  PT and patient discussed potential to use a pool in May and potentially trial aquatic therapy with our clinic for 1-2 sessions to learn appropriate ther ex.  Progress to patient tolerance.    PT Treatment/Interventions Aquatic Therapy;Cryotherapy;Moist Heat;Iontophoresis 4mg /ml Dexamethasone;Electrical Stimulation;Neuromuscular re-education;Therapeutic exercise;Therapeutic activities;Patient/family education;Manual techniques;Taping;Dry needling;Passive range of motion    PT Next Visit Plan assess and progress HEP, manual as indicated    PT Home Exercise Plan Access Code: [email or web address]    Consulted and Agree with Plan of Care Patient           Patient will benefit from skilled therapeutic intervention in order to improve the following deficits and impairments:     Visit Diagnosis: Chronic right-sided low back pain without sciatica  Muscle weakness (generalized)  Decreased activity tolerance     Problem List Patient Active Problem List   Diagnosis Date Noted  . Right shoulder glenohumeral labral degenerative changes 09/18/2019  . Family history of breast cancer in mother 09/06/2018  . Varicose veins of left leg with edema 06/06/2018  . Hyperbilirubinemia, suspect [name] Syndrome 09/16/2017  . Postgastrectomy malabsorption 09/16/2017  . Lipomatosis 04/27/2017  . Bipolar 1 disorder, depressed, moderate ([place]) 03/29/2017  . Primary osteoarthritis of both knees 10/14/2016  . History of sleeve gastrectomy 09/18/2015  . Radiculitis of right cervical region 09/16/2015  . Acute superficial venous thrombosis  of left lower extremity 09/16/2015  . Former smoker 07/03/2015  . Acid reflux 04/11/2015  . H/O renal calculi 04/11/2015  . Hypercholesterolemia without hypertriglyceridemia 04/11/2015  . Disordered sleep 04/11/2015  .  Snores 04/11/2015  . Lumbar spondylosis 03/06/2015  . DDD (degenerative disc disease), lumbosacral 03/06/2015  . Microscopic hematuria 06/06/2013  . Carpal tunnel syndrome 04/04/2012  . URINARY INCONTINENCE, URGE 09/24/2008  . ASTHMA, INTERMITTENT, MILD 02/06/2008  . DEPRESSION, SEVERE 08/25/2007    [place], PT 03/31/2021, 1:06 PM  [facility] [place], [place], [address] Phone: [phone]   Fax:  [phone]  Name: Michaela Dodson MRN: [id number] Date of Birth: Jul 26, 1971

## 2021-03-31 NOTE — Patient Instructions (Signed)
Access Code: [email or web address] URL: [email or web address] Date: 03/31/2021 Prepared by: Rudell Cobb  Exercises Hooklying Single Knee to Chest Stretch - 2 x daily - 7 x weekly - 1 sets - 3 reps - 20-30 seconds hold Supine Posterior Pelvic Tilt - 1 x daily - 7 x weekly - 2 sets - 10 reps - 5 seconds hold Supine March with Posterior Pelvic Tilt - 1 x daily - 7 x weekly - 3 sets - 10 reps Supine Piriformis Stretch with Leg Straight - 1 x daily - 7 x weekly - 3 sets - 1 reps - 20-30 seconds hold Bent Knee Fallouts with Alternating Legs - 2 x daily - 7 x weekly - 1 sets - 10 reps Supine Active Straight Leg Raise - 1 x daily - 7 x weekly - 3 sets - 10 reps Prone Hip Extension - 1 x daily - 7 x weekly - 3 sets - 10 reps Standing Quadratus Lumborum Stretch with Doorway - 2 x daily - 7 x weekly - 1 sets - 2 reps - 30 seconds hold Standing Quadratus Lumborum Mobilization with Small Ball on Wall - 2 x daily - 7 x weekly - 1 sets - 1 reps - 1 minute hold

## 2021-04-14 ENCOUNTER — Ambulatory Visit (INDEPENDENT_AMBULATORY_CARE_PROVIDER_SITE_OTHER): Payer: BC Managed Care – PPO | Admitting: Rehabilitative and Restorative Service Providers"

## 2021-04-14 ENCOUNTER — Other Ambulatory Visit: Payer: Self-pay

## 2021-04-14 ENCOUNTER — Encounter: Payer: Self-pay | Admitting: Rehabilitative and Restorative Service Providers"

## 2021-04-14 DIAGNOSIS — M6281 Muscle weakness (generalized): Secondary | ICD-10-CM

## 2021-04-14 DIAGNOSIS — M545 Low back pain, unspecified: Secondary | ICD-10-CM

## 2021-04-14 DIAGNOSIS — G8929 Other chronic pain: Secondary | ICD-10-CM | POA: Diagnosis not present

## 2021-04-14 DIAGNOSIS — R6889 Other general symptoms and signs: Secondary | ICD-10-CM

## 2021-04-14 NOTE — Patient Instructions (Signed)
Access Code: [email or web address] URL: [email or web address] Date: 04/14/2021 Prepared by: Rudell Cobb  Exercises Hooklying Single Knee to Chest Stretch - 2 x daily - 7 x weekly - 1 sets - 3 reps - 20-30 seconds hold Supine Posterior Pelvic Tilt - 1 x daily - 7 x weekly - 2 sets - 10 reps - 5 seconds hold Supine March with Posterior Pelvic Tilt - 1 x daily - 7 x weekly - 3 sets - 10 reps Supine Piriformis Stretch with Leg Straight - 1 x daily - 7 x weekly - 3 sets - 1 reps - 20-30 seconds hold Bent Knee Fallouts with Alternating Legs - 2 x daily - 7 x weekly - 1 sets - 10 reps Hooklying Lumbar Traction - 2 x daily - 7 x weekly - 1 sets - 5 reps Supine Active Straight Leg Raise - 1 x daily - 7 x weekly - 3 sets - 10 reps Prone Hip Extension - 1 x daily - 7 x weekly - 3 sets - 10 reps Standing Quadratus Lumborum Stretch with Doorway - 2 x daily - 7 x weekly - 1 sets - 2 reps - 30 seconds hold Standing Quadratus Lumborum Mobilization with Small Ball on Wall - 2 x daily - 7 x weekly - 1 sets - 1 reps - 1 minute hold

## 2021-04-14 NOTE — Therapy (Signed)
[place], [place], [address] Phone: [phone]   Fax:  [phone]  Physical Therapy Treatment  Patient Details  Name: Michaela Dodson MRN: [id number] Date of Birth: September 28, 1971 Referring Provider (PT): Aundria Mems, MD   Encounter Date: 04/14/2021   PT End of Session - 04/14/21 0812    Visit Number 4    Number of Visits 12    Date for PT Re-Evaluation 05/30/21    PT Start Time 0805    PT Stop Time 0845    PT Time Calculation (min) 40 min    Activity Tolerance Patient tolerated treatment well    Behavior During Therapy [facility] for tasks assessed/performed           Past Medical History:  Diagnosis Date  . Allergy   . Depression   . GERD (gastroesophageal reflux disease)    Pt states that this has gone away since her recent weight loss  . H/O thromboembolism    superficial-left medial thigh lipoma, vericose veins  . Obesity    Pt has lost the weight and is no longer obese    Past Surgical History:  Procedure Laterality Date  . BREAST BIOPSY    . CARPAL TUNNEL RELEASE Right 09/2012   Dr. Apolonio Schneiders  . CARPAL TUNNEL RELEASE Left 11/2013   Dr. Apolonio Schneiders  . TUBAL LIGATION    . uterine ablation      There were no vitals filed for this visit.   Subjective Assessment - 04/14/21 0809    Subjective The patient reports she is having L low back pain over the past 1.5 weeks.  Pain is typically on the right and midline.  "I can feel it there, but it's more concentrated on the left."    Patient Stated Goals decrease pain    Currently in Pain? Yes    Pain Score 4     Pain Location Back    Pain Orientation Left    Pain Descriptors / Indicators Aching    Pain Type Chronic pain    Pain Onset More than a month ago    Pain Frequency Constant    Aggravating Factors  activity    Pain Relieving Factors laying in adjustable bed              [facility] PT Assessment - 04/14/21 0812       Assessment   Medical Diagnosis L3/L5 facet arthritis    Referring Provider (PT) Aundria Mems, MD                         [facility] Adult PT Treatment/Exercise - 04/14/21 0816      Exercises   Exercises Lumbar      Lumbar Exercises: Stretches   Quad Stretch 3 reps;30 seconds    Quad Stretch Limitations L quad-- stretching in prone provokes L low back discomfort- had patient self stretch within tolerable ROM    Other Lumbar Stretch Exercise child's pose stretch    Other Lumbar Stretch Exercise rocking laterally on physioball for gentle low back stretch and oblique activation      Lumbar Exercises: Aerobic   Nustep L4 x 4 minutes      Lumbar Exercises: Supine   Large Ball Oblique Isometric 5 reps    Other Supine Lumbar Exercises isometric hip extension (from knee to chest position) and then into ball x 5 reps each (sub maximal contraction is tolerable)    Other  Supine Lumbar Exercises supine hooklying isometrics for gentle self lumbar traction (added to HEP)      Lumbar Exercises: Quadruped   Madcat/Old Horse 10 reps    Madcat/Old Horse Limitations rounding and then to neutral spine    Straight Leg Raise 5 reps      Manual Therapy   Manual Therapy Joint mobilization;Manual Traction;Passive ROM    Manual therapy comments to improve spinal mobility and decrease pain    Joint Mobilization CPA PA mid lumbar spine *provokes pain and hypomobility present    Passive ROM prone passive hip IR/ER    Manual Traction manual traction 1 minute x 3 reps R and L sides in prone                  PT Education - 04/14/21 1717    Education Details HEP progression    Person(s) Educated Patient    Methods Explanation;Demonstration;Handout    Comprehension Returned demonstration;Verbalized understanding               PT Long Term Goals - 03/07/21 0934      PT LONG TERM GOAL #1   Title Pt will be independent with HEP    Time 12    Period Weeks    Status New     Target Date 05/30/21      PT LONG TERM GOAL #2   Title Pt will improve bilat LE strength to 4+/5 to perform IADLs without pain    Time 12    Period Weeks    Status New    Target Date 05/30/21      PT LONG TERM GOAL #3   Title Pt will improve lumbar flexion ROM by 25% to perform bending and reaching activities with decreased pain    Time 12    Period Weeks    Status New    Target Date 05/30/21                 Plan - 04/14/21 1717    Clinical Impression Statement The patient has some pain in L side today.  PT progressed ther ex in clinic and for HEP to patient tolerance.  We discussed benefits of ther ex on mobility and for long term mgmt of low back pain.    PT Treatment/Interventions Aquatic Therapy;Cryotherapy;Moist Heat;Iontophoresis 4mg /ml Dexamethasone;Electrical Stimulation;Neuromuscular re-education;Therapeutic exercise;Therapeutic activities;Patient/family education;Manual techniques;Taping;Dry needling;Passive range of motion    PT Next Visit Plan assess and progress HEP, manual as indicated    PT Home Exercise Plan Access Code: [email or web address]    Consulted and Agree with Plan of Care Patient           Patient will benefit from skilled therapeutic intervention in order to improve the following deficits and impairments:     Visit Diagnosis: Chronic right-sided low back pain without sciatica  Muscle weakness (generalized)  Decreased activity tolerance     Problem List Patient Active Problem List   Diagnosis Date Noted  . Right shoulder glenohumeral labral degenerative changes 09/18/2019  . Family history of breast cancer in mother 09/06/2018  . Varicose veins of left leg with edema 06/06/2018  . Hyperbilirubinemia, suspect [name] Syndrome 09/16/2017  . Postgastrectomy malabsorption 09/16/2017  . Lipomatosis 04/27/2017  . Bipolar 1 disorder, depressed, moderate ([place]) 03/29/2017  . Primary osteoarthritis of both knees 10/14/2016  . History of sleeve  gastrectomy 09/18/2015  . Radiculitis of right cervical region 09/16/2015  . Acute superficial venous thrombosis of left lower extremity 09/16/2015  . Former  smoker 07/03/2015  . Acid reflux 04/11/2015  . H/O renal calculi 04/11/2015  . Hypercholesterolemia without hypertriglyceridemia 04/11/2015  . Disordered sleep 04/11/2015  . Snores 04/11/2015  . Lumbar spondylosis 03/06/2015  . DDD (degenerative disc disease), lumbosacral 03/06/2015  . Microscopic hematuria 06/06/2013  . Carpal tunnel syndrome 04/04/2012  . URINARY INCONTINENCE, URGE 09/24/2008  . ASTHMA, INTERMITTENT, MILD 02/06/2008  . DEPRESSION, SEVERE 08/25/2007    [place], PT 04/14/2021, 5:19 PM  [facility] [place], [place], [address] Phone: [phone]   Fax:  [phone]  Name: HEATHERLY STENNER MRN: [id number] Date of Birth: 1971-06-20

## 2021-04-21 DIAGNOSIS — Z1231 Encounter for screening mammogram for malignant neoplasm of breast: Secondary | ICD-10-CM | POA: Diagnosis not present

## 2021-04-21 LAB — HM MAMMOGRAPHY

## 2021-04-28 ENCOUNTER — Other Ambulatory Visit: Payer: Self-pay

## 2021-04-28 ENCOUNTER — Ambulatory Visit (INDEPENDENT_AMBULATORY_CARE_PROVIDER_SITE_OTHER): Payer: BC Managed Care – PPO | Admitting: Rehabilitative and Restorative Service Providers"

## 2021-04-28 ENCOUNTER — Encounter: Payer: Self-pay | Admitting: Rehabilitative and Restorative Service Providers"

## 2021-04-28 DIAGNOSIS — M545 Low back pain, unspecified: Secondary | ICD-10-CM | POA: Diagnosis not present

## 2021-04-28 DIAGNOSIS — M6281 Muscle weakness (generalized): Secondary | ICD-10-CM

## 2021-04-28 DIAGNOSIS — G8929 Other chronic pain: Secondary | ICD-10-CM | POA: Diagnosis not present

## 2021-04-28 DIAGNOSIS — R6889 Other general symptoms and signs: Secondary | ICD-10-CM

## 2021-04-28 NOTE — Patient Instructions (Signed)
Access Code: [email or web address] URL: [email or web address] Date: 04/28/2021 Prepared by: Rudell Cobb  Exercises Hooklying Single Knee to Chest Stretch - 1 x daily - 7 x weekly - 1 sets - 3 reps - 20-30 seconds hold Supine Lower Trunk Rotation - 1 x daily - 7 x weekly - 1 sets - 5 reps Supine Posterior Pelvic Tilt - 1 x daily - 7 x weekly - 2 sets - 10 reps - 5 seconds hold Supine March with Posterior Pelvic Tilt - 1 x daily - 7 x weekly - 3 sets - 10 reps Bent Knee Fallouts with Alternating Legs - 1 x daily - 7 x weekly - 1 sets - 10 reps Supine Active Straight Leg Raise - 1 x daily - 7 x weekly - 3 sets - 10 reps Supine Piriformis Stretch with Leg Straight - 1 x daily - 7 x weekly - 3 sets - 1 reps - 20-30 seconds hold Standing Quadratus Lumborum Stretch with Doorway - 2 x daily - 7 x weekly - 1 sets - 2 reps - 30 seconds hold Wall Quarter Squat - 1 x daily - 7 x weekly - 1 sets - 10-15 reps Standing Quadratus Lumborum Mobilization with Small Ball on Wall - 1 x daily - 7 x weekly - 1 sets - 1 reps - 1 minute hold

## 2021-04-28 NOTE — Therapy (Addendum)
[place], [place], [address] Phone: [phone]   Fax:  [phone]  Physical Therapy Treatment and Discharge Summary  Patient Details  Name: Michaela Dodson MRN: [id number] Date of Birth: [birth year] Referring Provider (PT): Aundria Mems, MD  PHYSICAL THERAPY DISCHARGE SUMMARY  Visits from Start of Care: 5  Current functional level related to goals / functional outcomes: See goals below.   Remaining deficits: No significant improvement with therapy   Education / Equipment: Has HEP.   Patient agrees to discharge. Patient goals were met.  Patient is being discharged due to not making improvement with current plan.  Encounter Date: 04/28/2021   PT End of Session - 04/28/21 1311     Visit Number 5    Number of Visits 12    Date for PT Re-Evaluation 05/30/21    Authorization Type BCBS    PT Start Time [phone]    PT Stop Time 0925    PT Time Calculation (min) 39 min             Past Medical History:  Diagnosis Date   Allergy    Depression    GERD (gastroesophageal reflux disease)    Pt states that this has gone away since her recent weight loss   H/O thromboembolism    superficial-left medial thigh lipoma, vericose veins   Obesity    Pt has lost the weight and is no longer obese    Past Surgical History:  Procedure Laterality Date   BREAST BIOPSY     CARPAL TUNNEL RELEASE Right 09/2012   Dr. Gershon Cull TUNNEL RELEASE Left 11/2013   Dr. Apolonio Schneiders   TUBAL LIGATION     uterine ablation      There were no vitals filed for this visit.   Subjective Assessment - 04/28/21 0847     Subjective The patient reports she is not noticing a significant change with PT or ther ex.  The patient reports she is sore after PT b/c we are moving things that she does not typically move.  She rests frequently between chores during the day.    Patient Stated Goals decrease pain    Currently  in Pain? Yes    Pain Score 4     Pain Location Back    Pain Orientation Left    Pain Descriptors / Indicators Aching;Sore    Pain Type Chronic pain    Pain Onset More than a month ago    Pain Frequency Constant    Aggravating Factors  activity    Pain Relieving Factors laying in adjustable bed                [facility] PT Assessment - 04/28/21 0855       Assessment   Medical Diagnosis L3/L5 facet arthritis    Referring Provider (PT) Aundria Mems, MD                           [facility] Adult PT Treatment/Exercise - 04/28/21 0855       Exercises   Exercises Lumbar      Lumbar Exercises: Stretches   Lower Trunk Rotation 5 reps    Lower Trunk Rotation Limitations with towel roll under lumbar spine    Piriformis Stretch Right;Left;2 reps;30 seconds    Other Lumbar Stretch Exercise standing QL stretch in door frame      Lumbar Exercises: Standing   Wall  Noted   Right shoulder glenohumeral labral degenerative changes 09/18/2019   Family history of  breast cancer in mother 09/06/2018   Varicose veins of left leg with edema 06/06/2018   Hyperbilirubinemia, suspect [name] Syndrome 09/16/2017   Postgastrectomy malabsorption 09/16/2017   Lipomatosis 04/27/2017   Bipolar 1 disorder, depressed, moderate ([place]) 03/29/2017   Primary osteoarthritis of both knees 10/14/2016   History of sleeve gastrectomy 09/18/2015   Radiculitis of right cervical region 09/16/2015   Acute superficial venous thrombosis of left lower extremity 09/16/2015   Former smoker 07/03/2015   Acid reflux 04/11/2015   H/O renal calculi 04/11/2015   Hypercholesterolemia without hypertriglyceridemia 04/11/2015   Disordered sleep 04/11/2015   Snores 04/11/2015   Lumbar spondylosis 03/06/2015   DDD (degenerative disc disease), lumbosacral 03/06/2015   Microscopic hematuria 06/06/2013   Carpal tunnel syndrome 04/04/2012   URINARY INCONTINENCE, URGE 09/24/2008   ASTHMA, INTERMITTENT, MILD 02/06/2008   DEPRESSION, SEVERE 08/25/2007    Marios Gaiser , PT 04/28/2021, 1:15 PM  [facility] [place] [address] [place], [place], [address] Phone: [phone]   Fax:  [phone]  Name: Michaela Dodson MRN: [id number] Date of Birth: 09-06-1971  [place], [place], [address] Phone: [phone]   Fax:  [phone]  Physical Therapy Treatment and Discharge Summary  Patient Details  Name: Michaela Dodson MRN: [id number] Date of Birth: [birth year] Referring Provider (PT): Aundria Mems, MD  PHYSICAL THERAPY DISCHARGE SUMMARY  Visits from Start of Care: 5  Current functional level related to goals / functional outcomes: See goals below.   Remaining deficits: No significant improvement with therapy   Education / Equipment: Has HEP.   Patient agrees to discharge. Patient goals were met.  Patient is being discharged due to not making improvement with current plan.  Encounter Date: 04/28/2021   PT End of Session - 04/28/21 1311     Visit Number 5    Number of Visits 12    Date for PT Re-Evaluation 05/30/21    Authorization Type BCBS    PT Start Time [phone]    PT Stop Time 0925    PT Time Calculation (min) 39 min             Past Medical History:  Diagnosis Date   Allergy    Depression    GERD (gastroesophageal reflux disease)    Pt states that this has gone away since her recent weight loss   H/O thromboembolism    superficial-left medial thigh lipoma, vericose veins   Obesity    Pt has lost the weight and is no longer obese    Past Surgical History:  Procedure Laterality Date   BREAST BIOPSY     CARPAL TUNNEL RELEASE Right 09/2012   Dr. Gershon Cull TUNNEL RELEASE Left 11/2013   Dr. Apolonio Schneiders   TUBAL LIGATION     uterine ablation      There were no vitals filed for this visit.   Subjective Assessment - 04/28/21 0847     Subjective The patient reports she is not noticing a significant change with PT or ther ex.  The patient reports she is sore after PT b/c we are moving things that she does not typically move.  She rests frequently between chores during the day.    Patient Stated Goals decrease pain    Currently  in Pain? Yes    Pain Score 4     Pain Location Back    Pain Orientation Left    Pain Descriptors / Indicators Aching;Sore    Pain Type Chronic pain    Pain Onset More than a month ago    Pain Frequency Constant    Aggravating Factors  activity    Pain Relieving Factors laying in adjustable bed                [facility] PT Assessment - 04/28/21 0855       Assessment   Medical Diagnosis L3/L5 facet arthritis    Referring Provider (PT) Aundria Mems, MD                           [facility] Adult PT Treatment/Exercise - 04/28/21 0855       Exercises   Exercises Lumbar      Lumbar Exercises: Stretches   Lower Trunk Rotation 5 reps    Lower Trunk Rotation Limitations with towel roll under lumbar spine    Piriformis Stretch Right;Left;2 reps;30 seconds    Other Lumbar Stretch Exercise standing QL stretch in door frame      Lumbar Exercises: Standing   Wall

## 2021-05-08 ENCOUNTER — Encounter: Payer: Self-pay | Admitting: Family Medicine

## 2021-05-12 ENCOUNTER — Encounter: Payer: BC Managed Care – PPO | Admitting: Rehabilitative and Restorative Service Providers"

## 2021-05-12 DIAGNOSIS — F3110 Bipolar disorder, current episode manic without psychotic features, unspecified: Secondary | ICD-10-CM | POA: Diagnosis not present

## 2021-05-28 DIAGNOSIS — M47816 Spondylosis without myelopathy or radiculopathy, lumbar region: Secondary | ICD-10-CM | POA: Diagnosis not present

## 2021-06-02 DIAGNOSIS — N323 Diverticulum of bladder: Secondary | ICD-10-CM | POA: Diagnosis not present

## 2021-06-02 DIAGNOSIS — N390 Urinary tract infection, site not specified: Secondary | ICD-10-CM | POA: Diagnosis not present

## 2021-06-02 DIAGNOSIS — N3946 Mixed incontinence: Secondary | ICD-10-CM | POA: Diagnosis not present

## 2021-06-02 DIAGNOSIS — N200 Calculus of kidney: Secondary | ICD-10-CM | POA: Diagnosis not present

## 2021-06-03 ENCOUNTER — Encounter: Payer: Self-pay | Admitting: Family Medicine

## 2021-06-11 DIAGNOSIS — R319 Hematuria, unspecified: Secondary | ICD-10-CM | POA: Diagnosis not present

## 2021-06-11 DIAGNOSIS — N3946 Mixed incontinence: Secondary | ICD-10-CM | POA: Diagnosis not present

## 2021-06-12 DIAGNOSIS — H40013 Open angle with borderline findings, low risk, bilateral: Secondary | ICD-10-CM | POA: Diagnosis not present

## 2021-06-26 ENCOUNTER — Ambulatory Visit (INDEPENDENT_AMBULATORY_CARE_PROVIDER_SITE_OTHER): Payer: Medicare Other

## 2021-06-26 ENCOUNTER — Ambulatory Visit (INDEPENDENT_AMBULATORY_CARE_PROVIDER_SITE_OTHER): Payer: BC Managed Care – PPO | Admitting: Sports Medicine

## 2021-06-26 ENCOUNTER — Other Ambulatory Visit: Payer: Self-pay

## 2021-06-26 DIAGNOSIS — M47816 Spondylosis without myelopathy or radiculopathy, lumbar region: Secondary | ICD-10-CM | POA: Diagnosis not present

## 2021-06-26 DIAGNOSIS — M19011 Primary osteoarthritis, right shoulder: Secondary | ICD-10-CM

## 2021-06-26 NOTE — Assessment & Plan Note (Signed)
This is a pleasant 50 year old female, chronic right shoulder pain, glenohumeral osteoarthritis and labral degenerative changes. Last injection was March of this year, recurrence of pain, repeat right glenohumeral injection today. If she drops below 3 months of relief we will get her in with Dr. Griffin Basil.

## 2021-06-26 NOTE — Assessment & Plan Note (Signed)
Michaela Dodson has L3-L5 facet arthritis, she finally got her radiofrequency ablation and her pain is significantly better and her back to the point where she can live with it, major victory!

## 2021-06-26 NOTE — Progress Notes (Signed)
    Procedures performed today:    Procedure: Real-time Ultrasound Guided injection of the right glenohumeral joint Device: Samsung HS60  Verbal informed consent obtained.  Time-out conducted.  Noted no overlying erythema, induration, or other signs of local infection.  Skin prepped in a sterile fashion.  Local anesthesia: Topical Ethyl chloride.  With sterile technique and under real time ultrasound guidance: Noted degenerative changes, 1 cc Kenalog 40, 2 cc lidocaine, 2 cc bupivacaine injected easily Completed without difficulty  Advised to call if fevers/chills, erythema, induration, drainage, or persistent bleeding.  Images permanently stored and available for review in PACS.  Impression: Technically successful ultrasound guided injection.  Independent interpretation of notes and tests performed by another provider:   None.  Brief History, Exam, Impression, and Recommendations:    Right shoulder glenohumeral labral degenerative changes This is a pleasant 50 year old female, chronic right shoulder pain, glenohumeral osteoarthritis and labral degenerative changes. Last injection was March of this year, recurrence of pain, repeat right glenohumeral injection today. If she drops below 3 months of relief we will get Dodson in with Dr. Griffin Basil.  Lumbar spondylosis Michaela Dodson has L3-L5 facet arthritis, she finally got Dodson radiofrequency ablation and Dodson pain is significantly better and Dodson back to the point where she can live with it, major victory!    ___________________________________________ Michaela Dodson. Michaela Dodson, M.D., ABFM., CAQSM. Primary Care and [place] Instructor of [place] [employer] [facility] of Medicine

## 2021-07-24 DIAGNOSIS — M47816 Spondylosis without myelopathy or radiculopathy, lumbar region: Secondary | ICD-10-CM | POA: Diagnosis not present

## 2021-07-24 DIAGNOSIS — M5459 Other low back pain: Secondary | ICD-10-CM | POA: Diagnosis not present

## 2021-07-24 DIAGNOSIS — M5136 Other intervertebral disc degeneration, lumbar region: Secondary | ICD-10-CM | POA: Diagnosis not present

## 2021-07-30 DIAGNOSIS — Z903 Acquired absence of stomach [part of]: Secondary | ICD-10-CM | POA: Diagnosis not present

## 2021-07-30 DIAGNOSIS — K912 Postsurgical malabsorption, not elsewhere classified: Secondary | ICD-10-CM | POA: Diagnosis not present

## 2021-08-08 DIAGNOSIS — F32A Depression, unspecified: Secondary | ICD-10-CM | POA: Diagnosis not present

## 2021-08-08 DIAGNOSIS — K912 Postsurgical malabsorption, not elsewhere classified: Secondary | ICD-10-CM | POA: Diagnosis not present

## 2021-08-08 DIAGNOSIS — Z903 Acquired absence of stomach [part of]: Secondary | ICD-10-CM | POA: Diagnosis not present

## 2021-08-08 DIAGNOSIS — Z87891 Personal history of nicotine dependence: Secondary | ICD-10-CM | POA: Diagnosis not present

## 2021-08-14 ENCOUNTER — Encounter: Payer: Self-pay | Admitting: Family Medicine

## 2021-09-02 DIAGNOSIS — F3110 Bipolar disorder, current episode manic without psychotic features, unspecified: Secondary | ICD-10-CM | POA: Diagnosis not present

## 2021-10-16 DIAGNOSIS — L821 Other seborrheic keratosis: Secondary | ICD-10-CM | POA: Diagnosis not present

## 2021-10-16 DIAGNOSIS — L918 Other hypertrophic disorders of the skin: Secondary | ICD-10-CM | POA: Diagnosis not present

## 2021-10-26 ENCOUNTER — Encounter (INDEPENDENT_AMBULATORY_CARE_PROVIDER_SITE_OTHER): Payer: BC Managed Care – PPO

## 2021-10-26 DIAGNOSIS — M19011 Primary osteoarthritis, right shoulder: Secondary | ICD-10-CM | POA: Diagnosis not present

## 2021-10-27 NOTE — Telephone Encounter (Signed)
I spent 5 total minutes of online digital evaluation and management services. 

## 2021-10-28 DIAGNOSIS — M25511 Pain in right shoulder: Secondary | ICD-10-CM | POA: Diagnosis not present

## 2021-10-30 ENCOUNTER — Encounter: Payer: Self-pay | Admitting: Family Medicine

## 2021-11-17 DIAGNOSIS — S43431A Superior glenoid labrum lesion of right shoulder, initial encounter: Secondary | ICD-10-CM | POA: Diagnosis not present

## 2021-11-17 DIAGNOSIS — M7521 Bicipital tendinitis, right shoulder: Secondary | ICD-10-CM | POA: Diagnosis not present

## 2021-11-17 DIAGNOSIS — Y999 Unspecified external cause status: Secondary | ICD-10-CM | POA: Diagnosis not present

## 2021-11-17 DIAGNOSIS — M24111 Other articular cartilage disorders, right shoulder: Secondary | ICD-10-CM | POA: Diagnosis not present

## 2021-11-17 DIAGNOSIS — X58XXXA Exposure to other specified factors, initial encounter: Secondary | ICD-10-CM | POA: Diagnosis not present

## 2021-11-17 DIAGNOSIS — G8918 Other acute postprocedural pain: Secondary | ICD-10-CM | POA: Diagnosis not present

## 2021-11-17 DIAGNOSIS — M7541 Impingement syndrome of right shoulder: Secondary | ICD-10-CM | POA: Diagnosis not present

## 2021-11-17 DIAGNOSIS — M19011 Primary osteoarthritis, right shoulder: Secondary | ICD-10-CM | POA: Diagnosis not present

## 2021-11-26 ENCOUNTER — Encounter: Payer: Self-pay | Admitting: Physical Therapy

## 2021-11-26 ENCOUNTER — Ambulatory Visit (INDEPENDENT_AMBULATORY_CARE_PROVIDER_SITE_OTHER): Payer: Medicare Other | Admitting: Physical Therapy

## 2021-11-26 ENCOUNTER — Other Ambulatory Visit: Payer: Self-pay

## 2021-11-26 DIAGNOSIS — M6281 Muscle weakness (generalized): Secondary | ICD-10-CM

## 2021-11-26 DIAGNOSIS — R6889 Other general symptoms and signs: Secondary | ICD-10-CM | POA: Diagnosis not present

## 2021-11-26 DIAGNOSIS — M25511 Pain in right shoulder: Secondary | ICD-10-CM

## 2021-11-26 DIAGNOSIS — R293 Abnormal posture: Secondary | ICD-10-CM

## 2021-11-26 DIAGNOSIS — R600 Localized edema: Secondary | ICD-10-CM

## 2021-11-26 NOTE — Patient Instructions (Signed)
Access Code: [email or web address] URL: [email or web address] Date: 11/26/2021 Prepared by: [name]  Exercises Circular Shoulder Pendulum with Table Support - 1 x daily - 7 x weekly - 3 sets - 10 reps Horizontal Shoulder Pendulum with Table Support - 1 x daily - 7 x weekly - 3 sets - 10 reps Flexion-Extension Shoulder Pendulum with Table Support - 1 x daily - 7 x weekly - 3 sets - 10 reps Standing Elbow Flexion Extension AROM - 1 x daily - 7 x weekly - 3 sets - 10 reps Supine Shoulder Flexion Extension AAROM with Dowel - 1 x daily - 7 x weekly - 1 sets - 10 reps - 10 seconds hold Supine Shoulder External Rotation with Dowel - 1 x daily - 7 x weekly - 1 sets - 10 reps - 10 seconds hold Seated Scapular Retraction - 1 x daily - 7 x weekly - 3 sets - 10 reps

## 2021-11-26 NOTE — Therapy (Signed)
[facility] [place] [address] [place], [place], [address] Phone: [phone]   Fax:  [phone]  Physical Therapy Evaluation  Patient Details  Name: Michaela Dodson MRN: [id number] Date of Birth: February 24, 1971 Referring Provider (PT): Everardo Pacific   Encounter Date: 11/26/2021   PT End of Session - 11/26/21 0847     Visit Number 1    Number of Visits 12    Date for PT Re-Evaluation 01/07/22    PT Start Time 0800    PT Stop Time 0845    PT Time Calculation (min) 45 min    Activity Tolerance Patient tolerated treatment well    Behavior During Therapy [facility] for tasks assessed/performed             Past Medical History:  Diagnosis Date   Allergy    Depression    GERD (gastroesophageal reflux disease)    Pt states that this has gone away since her recent weight loss   H/O thromboembolism    superficial-left medial thigh lipoma, vericose veins   Obesity    Pt has lost the weight and is no longer obese    Past Surgical History:  Procedure Laterality Date   BREAST BIOPSY     CARPAL TUNNEL RELEASE Right 09/2012   Dr. Mikle Bosworth RELEASE Left 11/2013   Dr. Orlan Leavens   TUBAL LIGATION     uterine ablation      There were no vitals filed for this visit.    Subjective Assessment - 11/26/21 0806     Subjective Pt referred s/p Rt shoulder arthroscopy with subacromial decompression and distal clavicle extension, biceps tenodesis repair    Limitations Lifting;House hold activities    Patient Stated Goals improve ROM and strength    Currently in Pain? Yes    Pain Score 2     Pain Location Shoulder    Pain Orientation Right    Pain Descriptors / Indicators Sore    Pain Type Surgical pain    Pain Onset 1 to 4 weeks ago    Pain Frequency Intermittent    Aggravating Factors  certain movements    Pain Relieving Factors tylenol                [facility] PT Assessment - 11/26/21 0001       Assessment   Medical  Diagnosis Rt shoulder arthroscopy with subacromial decompression with distal clavicle extension, biceps tenodesis    Referring Provider (PT) Everardo Pacific    Onset Date/Surgical Date 11/17/21    Hand Dominance Right    Next MD Visit 11/27/21      Precautions   Precaution Comments per protocol      Balance Screen   Has the patient fallen in the past 6 months No    Has the patient had a decrease in activity level because of a fear of falling?  No    Is the patient reluctant to leave their home because of a fear of falling?  No      Prior Function   Level of Independence Independent      Observation/Other Assessments   Focus on Therapeutic Outcomes (FOTO)  55      Posture/Postural Control   Posture Comments rounded shoulders      ROM / Strength   AROM / PROM / Strength AROM;PROM;Strength      AROM   AROM Assessment Site Shoulder    Right/Left Shoulder Right    Right Shoulder Flexion  50 Degrees    Right Shoulder ABduction --   not assessed due to protocol   Right Shoulder Internal Rotation 56 Degrees    Right Shoulder External Rotation 0 Degrees      PROM   PROM Assessment Site Shoulder    Right/Left Shoulder Right    Right Shoulder Flexion 117 Degrees    Right Shoulder ABduction 60 Degrees    Right Shoulder Internal Rotation 56 Degrees    Right Shoulder External Rotation 20 Degrees      Strength   Strength Assessment Site Elbow    Right/Left Elbow Right    Right Elbow Flexion 3/5   no resistance per protocol   Right Elbow Extension 3/5      Palpation   Palpation comment TTP biceps and anterior shoulder                        Objective measurements completed on examination: See above findings.       [facility] Adult PT Treatment/Exercise - 11/26/21 0001       Exercises   Exercises Shoulder      Shoulder Exercises: Supine   External Rotation 5 reps;AAROM    External Rotation Limitations in scapular plane    Flexion AAROM;5 reps      Shoulder Exercises:  Standing   Retraction 5 reps    Other Standing Exercises elbow AROM flex/ext x 10      Shoulder Exercises: ROM/Strengthening   Pendulum CW/CCW, A/P and horizontal all x 5      Modalities   Modalities Vasopneumatic      Vasopneumatic   Number Minutes Vasopneumatic  10 minutes    Vasopnuematic Location  Shoulder    Vasopneumatic Pressure Low    Vasopneumatic Temperature  34                     PT Education - 11/26/21 0833     Education Details PT POC and goals, HEP    Person(s) Educated Patient    Methods Explanation;Demonstration;Handout    Comprehension Returned demonstration;Verbalized understanding                 PT Long Term Goals - 11/26/21 0851       PT LONG TERM GOAL #1   Title Pt will be independent wiht HEP    Time 6    Period Weeks    Status New    Target Date 01/07/22      PT LONG TERM GOAL #2   Title Pt will improve FOTO to >= to 71 to demo improved functional mobility    Time 6    Period Weeks    Status New    Target Date 01/07/22      PT LONG TERM GOAL #3   Title Pt will improve Rt shoulder ROM to 140 degrees flexion, 60 degrees ER and 60 degrees IR to improve ability to dress without difficulty    Time 6    Period Weeks    Status New    Target Date 01/07/22      PT LONG TERM GOAL #4   Title Pt will improve Rt shoulder strength to 4/5 to perform household activities with decreased pain    Time 6    Period Weeks    Status New    Target Date 01/07/22                    Plan -  11/26/21 0848     Clinical Impression Statement Pt is a 50 y/o female referred s/p Rt shoulder arthorscopy with subacromial decompression, distal clavicle extension and biceps tenodesis repair 11/17/21. She presents with increased pain, decreased strength, ROM, decreased functional mobility and activity tolerance and will benefit from skilled PT to address deficits and improve functional mobility.    Personal Factors and Comorbidities  Past/Current Experience    Examination-Activity Limitations Dressing;Reach Overhead;Lift;Bathing    Examination-Participation Restrictions Cleaning;Community Activity;Meal Prep;Laundry    Stability/Clinical Decision Making Stable/Uncomplicated    Clinical Decision Making Low    Rehab Potential Good    PT Frequency 2x / week    PT Duration 6 weeks    PT Treatment/Interventions Cryotherapy;Moist Heat;Iontophoresis 4mg /ml Dexamethasone;Electrical Stimulation;Neuromuscular re-education;Therapeutic exercise;Therapeutic activities;Patient/family education;Manual techniques;Passive range of motion;Vasopneumatic Device;Dry needling;Taping    PT Next Visit Plan assess HEP, progress per protocol    PT Home Exercise Plan [id number]    Consulted and Agree with Plan of Care Patient             Patient will benefit from skilled therapeutic intervention in order to improve the following deficits and impairments:  Pain, Postural dysfunction, Impaired UE functional use, Decreased strength, Decreased activity tolerance, Decreased range of motion, Increased edema  Visit Diagnosis: Muscle weakness (generalized) - Plan: PT plan of care cert/re-cert  Acute pain of right shoulder - Plan: PT plan of care cert/re-cert  Abnormal posture - Plan: PT plan of care cert/re-cert  Decreased activity tolerance - Plan: PT plan of care cert/re-cert     Problem List Patient Active Problem List   Diagnosis Date Noted   Right shoulder glenohumeral labral degenerative changes 09/18/2019   Family history of breast cancer in mother 09/06/2018   Varicose veins of left leg with edema 06/06/2018   Hyperbilirubinemia, suspect Sullivan Lone Syndrome 09/16/2017   Postgastrectomy malabsorption 09/16/2017   Lipomatosis 04/27/2017   Bipolar 1 disorder, depressed, moderate ([facility]) 03/29/2017   Primary osteoarthritis of both knees 10/14/2016   History of sleeve gastrectomy 09/18/2015   Radiculitis of right cervical region 09/16/2015    Acute superficial venous thrombosis of left lower extremity 09/16/2015   Former smoker 07/03/2015   Acid reflux 04/11/2015   H/O renal calculi 04/11/2015   Hypercholesterolemia without hypertriglyceridemia 04/11/2015   Disordered sleep 04/11/2015   Snores 04/11/2015   Lumbar spondylosis 03/06/2015   DDD (degenerative disc disease), lumbosacral 03/06/2015   Microscopic hematuria 06/06/2013   Carpal tunnel syndrome 04/04/2012   URINARY INCONTINENCE, URGE 09/24/2008   ASTHMA, INTERMITTENT, MILD 02/06/2008   DEPRESSION, SEVERE 08/25/2007    Merial Moritz, PT 11/26/2021, 8:55 AM  [facility] [address] [place], [place], [address] Phone: [phone]   Fax:  [phone]  Name: DAHLIAH GUILLEMETTE MRN: [id number] Date of Birth: November 02, 1971

## 2021-12-01 ENCOUNTER — Other Ambulatory Visit: Payer: Self-pay

## 2021-12-01 ENCOUNTER — Encounter: Payer: Self-pay | Admitting: Physical Therapy

## 2021-12-01 ENCOUNTER — Ambulatory Visit (INDEPENDENT_AMBULATORY_CARE_PROVIDER_SITE_OTHER): Payer: Medicare Other | Admitting: Physical Therapy

## 2021-12-01 DIAGNOSIS — M25511 Pain in right shoulder: Secondary | ICD-10-CM

## 2021-12-01 DIAGNOSIS — R6889 Other general symptoms and signs: Secondary | ICD-10-CM | POA: Diagnosis not present

## 2021-12-01 DIAGNOSIS — R293 Abnormal posture: Secondary | ICD-10-CM | POA: Diagnosis not present

## 2021-12-01 DIAGNOSIS — M6281 Muscle weakness (generalized): Secondary | ICD-10-CM

## 2021-12-01 NOTE — Therapy (Signed)
[place]  [place], [place], [address] Phone: [phone]   Fax:  [phone]  Physical Therapy Treatment  Patient Details  Name: Michaela Dodson MRN: [id number] Date of Birth: 1971/03/18 Referring Provider (PT): Griffin Basil   Encounter Date: 12/01/2021   PT End of Session - 12/01/21 0858     Visit Number 2    Number of Visits 12    Date for PT Re-Evaluation 01/07/22    PT Start Time 0851    PT Stop Time 0931   ice last 10 min   PT Time Calculation (min) 40 min    Activity Tolerance Patient tolerated treatment well    Behavior During Therapy [facility] for tasks assessed/performed             Past Medical History:  Diagnosis Date   Allergy    Depression    GERD (gastroesophageal reflux disease)    Pt states that this has gone away since her recent weight loss   H/O thromboembolism    superficial-left medial thigh lipoma, vericose veins   Obesity    Pt has lost the weight and is no longer obese    Past Surgical History:  Procedure Laterality Date   BREAST BIOPSY     CARPAL TUNNEL RELEASE Right 09/2012   Dr. Loyola Mast RELEASE Left 11/2013   Dr. Apolonio Schneiders   TUBAL LIGATION     uterine ablation      There were no vitals filed for this visit.   Subjective Assessment - 12/01/21 0853     Subjective Pt reports her pain has much better since surgery, only taking Tylenol 500mg , 1x/day.  She is wearing sling during day but not at night.    Currently in Pain? No/denies    Pain Score 0-No pain                [facility] PT Assessment - 12/01/21 0001       Assessment   Medical Diagnosis Rt shoulder arthroscopy with subacromial decompression with distal clavicle extension, biceps tenodesis    Referring Provider (PT) Griffin Basil    Onset Date/Surgical Date 11/17/21    Hand Dominance Right    Next MD Visit --      Precautions   Precaution Comments per protocol      PROM   Right Shoulder  External Rotation 48 Degrees   supported scapular plane              [facility] Adult PT Treatment/Exercise - 12/01/21 0001       Elbow Exercises   Elbow Flexion AROM;Right;10 reps      Shoulder Exercises: Supine   External Rotation AAROM;Right;10 reps   supported supine   Flexion AAROM;Both;10 reps   forward bench press with cane     Shoulder Exercises: Seated   Other Seated Exercises Rt arm flex/ext AROM with hand resting on top of cane; range to tolerance x 10, cues for upright posture.    Other Seated Exercises scap squeeze z 5 sec x 10;  shoulder rolls backwards x 10      Shoulder Exercises: Standing   Other Standing Exercises Rt arm pendulum in side/side, front/back, and CW/CCW with tactile/VC      Hand Exercises   Other Hand Exercises grip with stress ball x 10; Rt wrist flexion/ ext stretch x 15 sec each direction      Modalities   Modalities Cryotherapy      Cryotherapy  Number Minutes Cryotherapy 10 Minutes    Cryotherapy Location Shoulder    Type of Cryotherapy Ice pack      Manual Therapy   Manual Therapy Passive ROM    Passive ROM Rt shoulder flexion and ER, to tissue llimits and no pain.                  PT Long Term Goals - 11/26/21 0851       PT LONG TERM GOAL #1   Title Pt will be independent wiht HEP    Time 6    Period Weeks    Status New    Target Date 01/07/22      PT LONG TERM GOAL #2   Title Pt will improve FOTO to >= to 71 to demo improved functional mobility    Time 6    Period Weeks    Status New    Target Date 01/07/22      PT LONG TERM GOAL #3   Title Pt will improve Rt shoulder ROM to 140 degrees flexion, 60 degrees ER and 60 degrees IR to improve ability to dress without difficulty    Time 6    Period Weeks    Status New    Target Date 01/07/22      PT LONG TERM GOAL #4   Title Pt will improve Rt shoulder strength to 4/5 to perform household activities with decreased pain    Time 6    Period Weeks    Status New     Target Date 01/07/22                   Plan - 12/01/21 0906     Clinical Impression Statement Pt arrived without sling as she stated it was too hard to drive with sling on; encouraged pt to wear sling at all times (per protocol) including at night until Dr d/c's it.  Somewhat guarded with PROM; improved with repetition and cues. She tolerated all exercises well. Goals are ongoing.    Personal Factors and Comorbidities Past/Current Experience    Examination-Activity Limitations Dressing;Reach Overhead;Lift;Bathing    Examination-Participation Restrictions Cleaning;Community Activity;Meal Prep;Laundry    Stability/Clinical Decision Making Stable/Uncomplicated    Rehab Potential Good    PT Frequency 2x / week    PT Duration 6 weeks    PT Treatment/Interventions Cryotherapy;Moist Heat;Iontophoresis 4mg /ml Dexamethasone;Electrical Stimulation;Neuromuscular re-education;Therapeutic exercise;Therapeutic activities;Patient/family education;Manual techniques;Passive range of motion;Vasopneumatic Device;Dry needling;Taping    PT Next Visit Plan progress per protocol.    PT Home Exercise Plan [email or web address]    Consulted and Agree with Plan of Care Patient             Patient will benefit from skilled therapeutic intervention in order to improve the following deficits and impairments:  Pain, Postural dysfunction, Impaired UE functional use, Decreased strength, Decreased activity tolerance, Decreased range of motion, Increased edema  Visit Diagnosis: Muscle weakness (generalized)  Acute pain of right shoulder  Abnormal posture  Decreased activity tolerance     Problem List Patient Active Problem List   Diagnosis Date Noted   Right shoulder glenohumeral labral degenerative changes 09/18/2019   Family history of breast cancer in mother 09/06/2018   Varicose veins of left leg with edema 06/06/2018   Hyperbilirubinemia, suspect [name] Syndrome 09/16/2017   Postgastrectomy  malabsorption 09/16/2017   Lipomatosis 04/27/2017   Bipolar 1 disorder, depressed, moderate ([place]) 03/29/2017   Primary osteoarthritis of both knees 10/14/2016   History of sleeve gastrectomy 09/18/2015  Radiculitis of right cervical region 09/16/2015   Acute superficial venous thrombosis of left lower extremity 09/16/2015   Former smoker 07/03/2015   Acid reflux 04/11/2015   H/O renal calculi 04/11/2015   Hypercholesterolemia without hypertriglyceridemia 04/11/2015   Disordered sleep 04/11/2015   Snores 04/11/2015   Lumbar spondylosis 03/06/2015   DDD (degenerative disc disease), lumbosacral 03/06/2015   Microscopic hematuria 06/06/2013   Carpal tunnel syndrome 04/04/2012   URINARY INCONTINENCE, URGE 09/24/2008   ASTHMA, INTERMITTENT, MILD 02/06/2008   DEPRESSION, SEVERE 08/25/2007   Kerin Perna, PTA 12/01/21 9:36 AM  [place] [address] [place], [place], [address] Phone: [phone]   Fax:  [phone]  Name: Michaela Dodson MRN: [id number] Date of Birth: Aug 24, 1971

## 2021-12-02 DIAGNOSIS — F3110 Bipolar disorder, current episode manic without psychotic features, unspecified: Secondary | ICD-10-CM | POA: Diagnosis not present

## 2021-12-03 ENCOUNTER — Ambulatory Visit (INDEPENDENT_AMBULATORY_CARE_PROVIDER_SITE_OTHER): Payer: Medicare Other | Admitting: Physical Therapy

## 2021-12-03 ENCOUNTER — Other Ambulatory Visit: Payer: Self-pay

## 2021-12-03 DIAGNOSIS — M25511 Pain in right shoulder: Secondary | ICD-10-CM

## 2021-12-03 DIAGNOSIS — M6281 Muscle weakness (generalized): Secondary | ICD-10-CM | POA: Diagnosis not present

## 2021-12-03 DIAGNOSIS — R600 Localized edema: Secondary | ICD-10-CM | POA: Diagnosis not present

## 2021-12-03 NOTE — Therapy (Signed)
Goals - 11/26/21 0851       PT LONG TERM GOAL #1   Title Pt will be independent wiht HEP    Time 6    Period Weeks    Status New    Target Date 01/07/22      PT LONG TERM GOAL #2   Title Pt will improve FOTO to >= to 71 to demo improved functional mobility    Time 6    Period Weeks    Status New    Target Date 01/07/22      PT LONG TERM GOAL #3   Title Pt will improve Rt shoulder ROM to 140 degrees flexion, 60 degrees ER and 60 degrees IR to improve ability to dress without difficulty    Time 6    Period Weeks    Status New    Target Date 01/07/22      PT LONG TERM GOAL #4   Title Pt will improve Rt shoulder strength to 4/5 to perform household activities with decreased pain    Time 6    Period Weeks    Status New    Target Date 01/07/22                   Plan - 12/03/21 0836     Clinical Impression Statement Pt with good tolerance to exercises and AAROM, PROM. Improving shoulder flexion and ER PROM.    PT Next Visit Plan progress per protocol.    PT Home Exercise Plan [email or web address]    Consulted and Agree with Plan of Care Patient             Patient will benefit  from skilled therapeutic intervention in order to improve the following deficits and impairments:     Visit Diagnosis: Muscle weakness (generalized)  Acute pain of right shoulder  Localized edema     Problem List Patient Active Problem List   Diagnosis Date Noted   Right shoulder glenohumeral labral degenerative changes 09/18/2019   Family history of breast cancer in mother 09/06/2018   Varicose veins of left leg with edema 06/06/2018   Hyperbilirubinemia, suspect [name] Syndrome 09/16/2017   Postgastrectomy malabsorption 09/16/2017   Lipomatosis 04/27/2017   Bipolar 1 disorder, depressed, moderate ([place]) 03/29/2017   Primary osteoarthritis of both knees 10/14/2016   History of sleeve gastrectomy 09/18/2015   Radiculitis of right cervical region 09/16/2015   Acute superficial venous thrombosis of left lower extremity 09/16/2015   Former smoker 07/03/2015   Acid reflux 04/11/2015   H/O renal calculi 04/11/2015   Hypercholesterolemia without hypertriglyceridemia 04/11/2015   Disordered sleep 04/11/2015   Snores 04/11/2015   Lumbar spondylosis 03/06/2015   DDD (degenerative disc disease), lumbosacral 03/06/2015   Microscopic hematuria 06/06/2013   Carpal tunnel syndrome 04/04/2012   URINARY INCONTINENCE, URGE 09/24/2008   ASTHMA, INTERMITTENT, MILD 02/06/2008   DEPRESSION, SEVERE 08/25/2007    Marcy Sookdeo, PT 12/03/2021, 8:37 AM  [facility] [address] [place], [place], [address] Phone: [phone]   Fax:  [phone]  Name: MATTILYNN FORRER MRN: [id number] Date of Birth: 1971/02/08  [place], [place], [address] Phone: [phone]   Fax:  [phone]  Physical Therapy Treatment  Patient Details  Name: NAIDELIN GUGLIOTTA MRN: [id number] Date of Birth: 1971-11-28 Referring Provider (PT): Griffin Basil   Encounter Date: 12/03/2021   PT End of Session - 12/03/21 0836     Visit Number 3    Number of Visits 12    Date for PT Re-Evaluation 01/07/22    PT Start Time 0805    PT Stop Time 0845    PT Time Calculation (min) 40 min    Activity Tolerance Patient tolerated treatment well    Behavior During Therapy [facility] for tasks assessed/performed             Past Medical History:  Diagnosis Date   Allergy    Depression    GERD (gastroesophageal reflux disease)    Pt states that this has gone away since her recent weight loss   H/O thromboembolism    superficial-left medial thigh lipoma, vericose veins   Obesity    Pt has lost the weight and is no longer obese    Past Surgical History:  Procedure Laterality Date   BREAST BIOPSY     CARPAL TUNNEL RELEASE Right 09/2012   Dr. Loyola Mast RELEASE Left 11/2013   Dr. Apolonio Schneiders   TUBAL LIGATION     uterine ablation      There were no vitals filed for this visit.   Subjective Assessment - 12/03/21 0811     Subjective Pt reports she is wearing her sling at all times now per MD orders    Patient Stated Goals improve ROM and strength    Currently in Pain? No/denies                [facility] PT Assessment - 12/03/21 0001       Assessment   Medical Diagnosis Rt shoulder arthroscopy with subacromial decompression with distal clavicle extension, biceps tenodesis    Referring Provider (PT) Griffin Basil    Onset Date/Surgical Date 11/17/21    Hand Dominance Right      Precautions   Precaution Comments per protocol                           [facility] Adult PT Treatment/Exercise - 12/03/21 0001       Shoulder  Exercises: Supine   Flexion AAROM;10 reps      Shoulder Exercises: Seated   Other Seated Exercises elbow flex/ext x 10      Shoulder Exercises: Standing   External Rotation AAROM;Right;10 reps    Retraction 20 reps    Other Standing Exercises Rt arm pendulum in side/side, front/back, and CW/CCW with tactile/VC      Hand Exercises   Other Hand Exercises grip with stress ball x 10; Rt wrist flexion/ ext stretch x 15 sec each direction      Modalities   Modalities Vasopneumatic      Vasopneumatic   Number Minutes Vasopneumatic  10 minutes    Vasopnuematic Location  Shoulder    Vasopneumatic Pressure Low    Vasopneumatic Temperature  34      Manual Therapy   Passive ROM Rt shoulder flexion and ER, to tissue llimits and no pain.                          PT Long Term

## 2021-12-03 NOTE — Addendum Note (Signed)
Addended by: Kennith Gain on: 12/03/2021 08:40 AM   Modules accepted: Orders

## 2021-12-04 ENCOUNTER — Encounter: Payer: Self-pay | Admitting: Family Medicine

## 2021-12-08 ENCOUNTER — Other Ambulatory Visit: Payer: Self-pay

## 2021-12-08 ENCOUNTER — Encounter: Payer: Self-pay | Admitting: Physical Therapy

## 2021-12-08 ENCOUNTER — Ambulatory Visit (INDEPENDENT_AMBULATORY_CARE_PROVIDER_SITE_OTHER): Payer: BC Managed Care – PPO | Admitting: Physical Therapy

## 2021-12-08 DIAGNOSIS — R600 Localized edema: Secondary | ICD-10-CM

## 2021-12-08 DIAGNOSIS — M6281 Muscle weakness (generalized): Secondary | ICD-10-CM

## 2021-12-08 DIAGNOSIS — R293 Abnormal posture: Secondary | ICD-10-CM

## 2021-12-08 DIAGNOSIS — M25511 Pain in right shoulder: Secondary | ICD-10-CM | POA: Diagnosis not present

## 2021-12-08 NOTE — Therapy (Signed)
[place], [place], [address] Phone: [phone]   Fax:  [phone]  Physical Therapy Treatment  Patient Details  Name: Michaela Dodson MRN: [id number] Date of Birth: 18-Apr-1971 Referring Provider (PT): Griffin Basil   Encounter Date: 12/08/2021   PT End of Session - 12/08/21 0853     Visit Number 4    Number of Visits 12    Date for PT Re-Evaluation 01/07/22    PT Start Time 0849    PT Stop Time 0932    PT Time Calculation (min) 43 min    Activity Tolerance Patient tolerated treatment well    Behavior During Therapy [facility] for tasks assessed/performed             Past Medical History:  Diagnosis Date   Allergy    Depression    GERD (gastroesophageal reflux disease)    Pt states that this has gone away since her recent weight loss   H/O thromboembolism    superficial-left medial thigh lipoma, vericose veins   Obesity    Pt has lost the weight and is no longer obese    Past Surgical History:  Procedure Laterality Date   BREAST BIOPSY     CARPAL TUNNEL RELEASE Right 09/2012   Dr. Loyola Mast RELEASE Left 11/2013   Dr. Apolonio Schneiders   TUBAL LIGATION     uterine ablation      There were no vitals filed for this visit.   Subjective Assessment - 12/08/21 0853     Subjective Pt reports she is wearing sling to sleep about 50% of time. she is not icing at home, only at PT sessions.   Pt reports no changes since last visit.    Patient Stated Goals improve ROM and strength    Currently in Pain? No/denies    Pain Score 0-No pain                [facility] PT Assessment - 12/08/21 0001       Assessment   Medical Diagnosis Rt shoulder arthroscopy with subacromial decompression with distal clavicle extension, biceps tenodesis    Referring Provider (PT) Griffin Basil    Onset Date/Surgical Date 11/17/21    Hand Dominance Right    Next MD Visit 12/23/21      Precautions   Precaution  Comments per protocol      PROM   Right Shoulder External Rotation 55 Degrees   in supported supine            [facility] Adult PT Treatment/Exercise - 12/08/21 0001       Shoulder Exercises: Supine   External Rotation AAROM;Right;10 reps   supported on pillow, with cane   Flexion AAROM;10 reps   press up and moving towards head.   Other Supine Exercises scap retraction/ thoracic lift x 5 sec x 10    Other Supine Exercises Rt elbow flexion / ext x 10      Shoulder Exercises: Standing   Internal Rotation AAROM;Right;10 reps   cane, to Rt buttocks with elbow straight.   Internal Rotation Limitations range to tolerance    Extension AAROM;Both;10 reps   cane; to tolerance   Other Standing Exercises Rt arm pendulum front/back, and CW/CCW      Vasopneumatic   Number Minutes Vasopneumatic  10 minutes    Vasopnuematic Location  Shoulder    Vasopneumatic Pressure Low    Vasopneumatic Temperature  34  Manual Therapy   Manual Therapy Soft tissue mobilization;Scapular mobilization;Passive ROM    Soft tissue mobilization STM to Rt levator, upper trap, rhomboid, pec major, and biceps brachii to decrease fascial restrictions and improve mobility.    Scapular Mobilization Rt    Passive ROM Rt shoulder flexion and ext, to tissue llimits and no pain.                 PT Long Term Goals - 11/26/21 0851       PT LONG TERM GOAL #1   Title Pt will be independent wiht HEP    Time 6    Period Weeks    Status New    Target Date 01/07/22      PT LONG TERM GOAL #2   Title Pt will improve FOTO to >= to 71 to demo improved functional mobility    Time 6    Period Weeks    Status New    Target Date 01/07/22      PT LONG TERM GOAL #3   Title Pt will improve Rt shoulder ROM to 140 degrees flexion, 60 degrees ER and 60 degrees IR to improve ability to dress without difficulty    Time 6    Period Weeks    Status New    Target Date 01/07/22      PT LONG TERM GOAL #4   Title Pt will  improve Rt shoulder strength to 4/5 to perform household activities with decreased pain    Time 6    Period Weeks    Status New    Target Date 01/07/22                   Plan - 12/08/21 0926     Clinical Impression Statement Pt demonstrated improved Rt shoulder ER AAROM. Tolerated new AAROM in standing (ext/ IR).  Encouraged pt to maintain protocol precautions and wear sling as directed while in protective phase; pt verbalized understanding. Goals are ongoing. Pt moves to next rehab phase on 12/15/21.    Rehab Potential Good    PT Frequency 2x / week    PT Duration 6 weeks    PT Treatment/Interventions Cryotherapy;Moist Heat;Iontophoresis 4mg /ml Dexamethasone;Electrical Stimulation;Neuromuscular re-education;Therapeutic exercise;Therapeutic activities;Patient/family education;Manual techniques;Passive range of motion;Vasopneumatic Device;Dry needling;Taping    PT Next Visit Plan progress per protocol.    PT Home Exercise Plan [id number]    Consulted and Agree with Plan of Care Patient             Patient will benefit from skilled therapeutic intervention in order to improve the following deficits and impairments:  Pain, Postural dysfunction, Impaired UE functional use, Decreased strength, Decreased activity tolerance, Decreased range of motion, Increased edema  Visit Diagnosis: Muscle weakness (generalized)  Acute pain of right shoulder  Localized edema  Abnormal posture     Problem List Patient Active Problem List   Diagnosis Date Noted   Right shoulder glenohumeral labral degenerative changes 09/18/2019   Family history of breast cancer in mother 09/06/2018   Varicose veins of left leg with edema 06/06/2018   Hyperbilirubinemia, suspect [name] Syndrome 09/16/2017   Postgastrectomy malabsorption 09/16/2017   Lipomatosis 04/27/2017   Bipolar 1 disorder, depressed, moderate ([place]) 03/29/2017   Primary osteoarthritis of both knees 10/14/2016   History of sleeve  gastrectomy 09/18/2015   Radiculitis of right cervical region 09/16/2015   Acute superficial venous thrombosis of left lower extremity 09/16/2015   Former smoker 07/03/2015   Acid reflux 04/11/2015  H/O renal calculi 04/11/2015   Hypercholesterolemia without hypertriglyceridemia 04/11/2015   Disordered sleep 04/11/2015   Snores 04/11/2015   Lumbar spondylosis 03/06/2015   DDD (degenerative disc disease), lumbosacral 03/06/2015   Microscopic hematuria 06/06/2013   Carpal tunnel syndrome 04/04/2012   URINARY INCONTINENCE, URGE 09/24/2008   ASTHMA, INTERMITTENT, MILD 02/06/2008   DEPRESSION, SEVERE 08/25/2007   Kerin Perna, PTA 12/08/21 9:29 AM  [place], [place], [address] Phone: [phone]   Fax:  [phone]  Name: Michaela Dodson MRN: [id number] Date of Birth: 04-Nov-1971

## 2021-12-12 ENCOUNTER — Encounter: Payer: BC Managed Care – PPO | Admitting: Physical Therapy

## 2021-12-15 ENCOUNTER — Encounter: Payer: BC Managed Care – PPO | Admitting: Physical Therapy

## 2021-12-17 ENCOUNTER — Ambulatory Visit (INDEPENDENT_AMBULATORY_CARE_PROVIDER_SITE_OTHER): Payer: Medicare Other | Admitting: Physical Therapy

## 2021-12-17 ENCOUNTER — Other Ambulatory Visit: Payer: Self-pay

## 2021-12-17 DIAGNOSIS — R600 Localized edema: Secondary | ICD-10-CM

## 2021-12-17 DIAGNOSIS — M25511 Pain in right shoulder: Secondary | ICD-10-CM | POA: Diagnosis not present

## 2021-12-17 DIAGNOSIS — M6281 Muscle weakness (generalized): Secondary | ICD-10-CM

## 2021-12-17 NOTE — Therapy (Signed)
[place], [place], [address] Phone: [phone]   Fax:  [phone]  Physical Therapy Treatment  Patient Details  Name: Michaela Dodson MRN: [id number] Date of Birth: 1971-06-29 Referring Provider (PT): Griffin Basil   Encounter Date: 12/17/2021   PT End of Session - 12/17/21 0836     Visit Number 5    Number of Visits 12    Date for PT Re-Evaluation 01/07/22    PT Start Time 0800    PT Stop Time 0845    PT Time Calculation (min) 45 min    Activity Tolerance Patient tolerated treatment well    Behavior During Therapy [facility] for tasks assessed/performed             Past Medical History:  Diagnosis Date   Allergy    Depression    GERD (gastroesophageal reflux disease)    Pt states that this has gone away since her recent weight loss   H/O thromboembolism    superficial-left medial thigh lipoma, vericose veins   Obesity    Pt has lost the weight and is no longer obese    Past Surgical History:  Procedure Laterality Date   BREAST BIOPSY     CARPAL TUNNEL RELEASE Right 09/2012   Dr. Loyola Mast RELEASE Left 11/2013   Dr. Apolonio Schneiders   TUBAL LIGATION     uterine ablation      There were no vitals filed for this visit.   Subjective Assessment - 12/17/21 0802     Subjective Pt reports she is feeling much better. She tried to reach behind her in the car and felt some pain, but it eased off quickly    Currently in Pain? No/denies                [facility] PT Assessment - 12/17/21 0001       Assessment   Medical Diagnosis Rt shoulder arthroscopy with subacromial decompression with distal clavicle extension, biceps tenodesis    Referring Provider (PT) Griffin Basil    Onset Date/Surgical Date 11/17/21    Hand Dominance Right    Next MD Visit 12/23/21      Precautions   Precaution Comments per protocol      PROM   Right Shoulder Internal Rotation 56 Degrees    Right Shoulder  External Rotation 65 Degrees                           [facility] Adult PT Treatment/Exercise - 12/17/21 0001       Shoulder Exercises: Supine   Other Supine Exercises scap retraction/ thoracic lift x 5 sec x 10      Shoulder Exercises: Seated   Other Seated Exercises bicep curls 3# x 15      Shoulder Exercises: Pulleys   Flexion 2 minutes      Shoulder Exercises: Isometric Strengthening   Flexion 5X5"    Flexion Limitations reactive isometric red TB    Extension 5X5"    Extension Limitations reactive isometric red TB    External Rotation 5X5"    External Rotation Limitations reactive isometric, red TB    Internal Rotation 5X5"    Internal Rotation Limitations reactive isometric red TB      Vasopneumatic   Number Minutes Vasopneumatic  10 minutes    Vasopnuematic Location  Shoulder    Vasopneumatic Pressure Low    Vasopneumatic Temperature  34  [place], [place], [address] Phone: [phone]   Fax:  [phone]  Physical Therapy Treatment  Patient Details  Name: Michaela Dodson MRN: [id number] Date of Birth: 1971-06-29 Referring Provider (PT): Griffin Basil   Encounter Date: 12/17/2021   PT End of Session - 12/17/21 0836     Visit Number 5    Number of Visits 12    Date for PT Re-Evaluation 01/07/22    PT Start Time 0800    PT Stop Time 0845    PT Time Calculation (min) 45 min    Activity Tolerance Patient tolerated treatment well    Behavior During Therapy [facility] for tasks assessed/performed             Past Medical History:  Diagnosis Date   Allergy    Depression    GERD (gastroesophageal reflux disease)    Pt states that this has gone away since her recent weight loss   H/O thromboembolism    superficial-left medial thigh lipoma, vericose veins   Obesity    Pt has lost the weight and is no longer obese    Past Surgical History:  Procedure Laterality Date   BREAST BIOPSY     CARPAL TUNNEL RELEASE Right 09/2012   Dr. Loyola Mast RELEASE Left 11/2013   Dr. Apolonio Schneiders   TUBAL LIGATION     uterine ablation      There were no vitals filed for this visit.   Subjective Assessment - 12/17/21 0802     Subjective Pt reports she is feeling much better. She tried to reach behind her in the car and felt some pain, but it eased off quickly    Currently in Pain? No/denies                [facility] PT Assessment - 12/17/21 0001       Assessment   Medical Diagnosis Rt shoulder arthroscopy with subacromial decompression with distal clavicle extension, biceps tenodesis    Referring Provider (PT) Griffin Basil    Onset Date/Surgical Date 11/17/21    Hand Dominance Right    Next MD Visit 12/23/21      Precautions   Precaution Comments per protocol      PROM   Right Shoulder Internal Rotation 56 Degrees    Right Shoulder  External Rotation 65 Degrees                           [facility] Adult PT Treatment/Exercise - 12/17/21 0001       Shoulder Exercises: Supine   Other Supine Exercises scap retraction/ thoracic lift x 5 sec x 10      Shoulder Exercises: Seated   Other Seated Exercises bicep curls 3# x 15      Shoulder Exercises: Pulleys   Flexion 2 minutes      Shoulder Exercises: Isometric Strengthening   Flexion 5X5"    Flexion Limitations reactive isometric red TB    Extension 5X5"    Extension Limitations reactive isometric red TB    External Rotation 5X5"    External Rotation Limitations reactive isometric, red TB    Internal Rotation 5X5"    Internal Rotation Limitations reactive isometric red TB      Vasopneumatic   Number Minutes Vasopneumatic  10 minutes    Vasopnuematic Location  Shoulder    Vasopneumatic Pressure Low    Vasopneumatic Temperature  34  05/21/1971 ° ° ° °

## 2021-12-19 ENCOUNTER — Ambulatory Visit (INDEPENDENT_AMBULATORY_CARE_PROVIDER_SITE_OTHER): Payer: Medicare Other | Admitting: Physical Therapy

## 2021-12-19 ENCOUNTER — Other Ambulatory Visit: Payer: Self-pay

## 2021-12-19 ENCOUNTER — Encounter: Payer: Self-pay | Admitting: Physical Therapy

## 2021-12-19 DIAGNOSIS — R600 Localized edema: Secondary | ICD-10-CM

## 2021-12-19 DIAGNOSIS — R293 Abnormal posture: Secondary | ICD-10-CM | POA: Diagnosis not present

## 2021-12-19 DIAGNOSIS — M25511 Pain in right shoulder: Secondary | ICD-10-CM

## 2021-12-19 DIAGNOSIS — M6281 Muscle weakness (generalized): Secondary | ICD-10-CM

## 2021-12-19 NOTE — Therapy (Signed)
[place], [place], [address] Phone: [phone]   Fax:  [phone]  Physical Therapy Treatment  Patient Details  Name: KINZLY PIERRELOUIS MRN: [id number] Date of Birth: [birth year] Referring Provider (PT): Griffin Basil   Encounter Date: 12/19/2021   PT End of Session - 12/19/21 0848     Visit Number 6    Number of Visits 12    Date for PT Re-Evaluation 01/07/22    PT Start Time 0852    PT Stop Time 0923    PT Time Calculation (min) 31 min    Activity Tolerance Patient tolerated treatment well    Behavior During Therapy [facility] for tasks assessed/performed             Past Medical History:  Diagnosis Date   Allergy    Depression    GERD (gastroesophageal reflux disease)    Pt states that this has gone away since her recent weight loss   H/O thromboembolism    superficial-left medial thigh lipoma, vericose veins   Obesity    Pt has lost the weight and is no longer obese    Past Surgical History:  Procedure Laterality Date   BREAST BIOPSY     CARPAL TUNNEL RELEASE Right 09/2012   Dr. Loyola Mast RELEASE Left 11/2013   Dr. Apolonio Schneiders   TUBAL LIGATION     uterine ablation      There were no vitals filed for this visit.   Subjective Assessment - 12/19/21 0854     Subjective "Things are perfect".  No new changes since last visit.  Last time she wore sling was last week.    Patient Stated Goals improve ROM and strength    Currently in Pain? No/denies    Pain Score 0-No pain                [facility] PT Assessment - 12/19/21 0001       Assessment   Medical Diagnosis Rt shoulder arthroscopy with subacromial decompression with distal clavicle extension, biceps tenodesis    Referring Provider (PT) Griffin Basil    Onset Date/Surgical Date 11/17/21    Hand Dominance Right    Next MD Visit 12/23/21      Precautions   Precaution Comments per protocol      Observation/Other Assessments    Focus on Therapeutic Outcomes (FOTO)  54 functional score; goal 71      AROM   Overall AROM Comments taken in standing    Right Shoulder Extension 39 Degrees    Right Shoulder Flexion 110 Degrees    Right Shoulder ABduction --    Right Shoulder Internal Rotation --   thumb to waist band   Right Shoulder External Rotation 77 Degrees   in 45 scaption     PROM   Right Shoulder Extension 42 Degrees   AAROM with cane   Right Shoulder Flexion 127 Degrees   AAROM with pulleys   Right Shoulder ABduction --               [facility] Adult PT Treatment/Exercise - 12/19/21 0001       Elbow Exercises   Elbow Flexion Strengthening;Right;15 reps;Seated   3#     Shoulder Exercises: Standing   Internal Rotation AAROM;Both;10 reps   cane behind back; 2 variations   Flexion AAROM;Right;5 reps   with cane   Extension AAROM;Both;10 reps   cane; to tolerance     Shoulder Exercises: Pulleys  Flexion --   10 reps, 5 sec hold   Scaption --   10 reps, 5 sec hold   Scaption Limitations to tissue tolerance      Shoulder Exercises: Isometric Strengthening   Flexion Limitations reactive isometric red TB   10 reps, 3 sec hold   Extension Limitations reactive isometric red TB   10 reps, 3 sec hold   External Rotation Limitations reactive isometric, red TB   10 reps, 3 sec hold   Internal Rotation Limitations reactive isometric red TB   10 reps, 3 sec hold     Shoulder Exercises: Stretch   Other Shoulder Stretches Rt shoulder ext stretch holding counter x 10 sec x 4 reps; cues to be gentle with range.      Modalities   Modalities --   declined.  will ice at home     Vasopneumatic   Number Minutes Vasopneumatic  --    Vasopnuematic Location  --    Vasopneumatic Pressure --    Vasopneumatic Temperature  --      Manual Therapy   Manual Therapy --   declined                         PT Long Term Goals - 12/19/21 0955       PT LONG TERM GOAL #1   Title Pt will be independent  wiht HEP    Time 6    Period Weeks    Status On-going    Target Date 01/07/22      PT LONG TERM GOAL #2   Title Pt will improve FOTO to >= to 71 to demo improved functional mobility    Time 6    Period Weeks    Status On-going    Target Date 01/07/22      PT LONG TERM GOAL #3   Title Pt will improve Rt shoulder ROM to 140 degrees flexion, 60 degrees ER and 60 degrees IR to improve ability to dress without difficulty    Time 6    Period Weeks    Status On-going    Target Date 01/07/22      PT LONG TERM GOAL #4   Title Pt will improve Rt shoulder strength to 4/5 to perform household activities with decreased pain    Time 6    Period Weeks    Status On-going    Target Date 01/07/22                   Plan - 12/19/21 0948     Clinical Impression Statement Pt's Rt shoulder ROM gradually progressing.  Minor cues for form of exercises; all completed without pain. Reminded pt of rehab precautions and encouraged compliance over next few weeks while in protective phase.  Goals are ongoing.    Rehab Potential Good    PT Frequency 2x / week    PT Duration 6 weeks    PT Treatment/Interventions Cryotherapy;Moist Heat;Iontophoresis 4mg /ml Dexamethasone;Electrical Stimulation;Neuromuscular re-education;Therapeutic exercise;Therapeutic activities;Patient/family education;Manual techniques;Passive range of motion;Vasopneumatic Device;Dry needling;Taping    PT Next Visit Plan progress per protocol.  Next Phase on 01/12/22.    PT Home Exercise Plan [id number]    Consulted and Agree with Plan of Care Patient             Patient will benefit from skilled therapeutic intervention in order to improve the following deficits and impairments:  Pain, Postural dysfunction, Impaired UE functional use, Decreased strength, Decreased  activity tolerance, Decreased range of motion, Increased edema  Visit Diagnosis: Muscle weakness (generalized)  Acute pain of right shoulder  Localized  edema  Abnormal posture     Problem List Patient Active Problem List   Diagnosis Date Noted   Right shoulder glenohumeral labral degenerative changes 09/18/2019   Family history of breast cancer in mother 09/06/2018   Varicose veins of left leg with edema 06/06/2018   Hyperbilirubinemia, suspect [name] Syndrome 09/16/2017   Postgastrectomy malabsorption 09/16/2017   Lipomatosis 04/27/2017   Bipolar 1 disorder, depressed, moderate ([place]) 03/29/2017   Primary osteoarthritis of both knees 10/14/2016   History of sleeve gastrectomy 09/18/2015   Radiculitis of right cervical region 09/16/2015   Acute superficial venous thrombosis of left lower extremity 09/16/2015   Former smoker 07/03/2015   Acid reflux 04/11/2015   H/O renal calculi 04/11/2015   Hypercholesterolemia without hypertriglyceridemia 04/11/2015   Disordered sleep 04/11/2015   Snores 04/11/2015   Lumbar spondylosis 03/06/2015   DDD (degenerative disc disease), lumbosacral 03/06/2015   Microscopic hematuria 06/06/2013   Carpal tunnel syndrome 04/04/2012   URINARY INCONTINENCE, URGE 09/24/2008   ASTHMA, INTERMITTENT, MILD 02/06/2008   DEPRESSION, SEVERE 08/25/2007    Shelbie Hutching 12/19/2021, 9:55 AM  [facility] [address] [place], [place], [address] Phone: [phone]   Fax:  [phone]  Name: TAKEISHA CIANCI MRN: [id number] Date of Birth: 1971-12-04

## 2021-12-23 DIAGNOSIS — M19011 Primary osteoarthritis, right shoulder: Secondary | ICD-10-CM | POA: Diagnosis not present

## 2022-01-02 ENCOUNTER — Other Ambulatory Visit: Payer: Self-pay

## 2022-01-02 ENCOUNTER — Ambulatory Visit: Payer: Medicare Other | Attending: Orthopaedic Surgery | Admitting: Physical Therapy

## 2022-01-02 DIAGNOSIS — M6281 Muscle weakness (generalized): Secondary | ICD-10-CM | POA: Insufficient documentation

## 2022-01-02 DIAGNOSIS — Z9889 Other specified postprocedural states: Secondary | ICD-10-CM | POA: Insufficient documentation

## 2022-01-02 DIAGNOSIS — R600 Localized edema: Secondary | ICD-10-CM | POA: Diagnosis not present

## 2022-01-02 DIAGNOSIS — M25511 Pain in right shoulder: Secondary | ICD-10-CM | POA: Diagnosis not present

## 2022-01-02 NOTE — Therapy (Signed)
[place], [place], [address] Phone: [phone]   Fax:  [phone]  Physical Therapy Treatment and Recertification  Patient Details  Name: Michaela Dodson MRN: [id number] Date of Birth: August 07, 1971 Referring Provider (PT): Griffin Basil   Encounter Date: 01/02/2022   PT End of Session - 01/02/22 1048     Visit Number 7    Number of Visits 18    Date for PT Re-Evaluation 02/13/22    PT Start Time 7412    PT Stop Time 1055    PT Time Calculation (min) 40 min    Activity Tolerance Patient tolerated treatment well    Behavior During Therapy [facility] for tasks assessed/performed             Past Medical History:  Diagnosis Date   Allergy    Depression    GERD (gastroesophageal reflux disease)    Pt states that this has gone away since her recent weight loss   H/O thromboembolism    superficial-left medial thigh lipoma, vericose veins   Obesity    Pt has lost the weight and is no longer obese    Past Surgical History:  Procedure Laterality Date   BREAST BIOPSY     CARPAL TUNNEL RELEASE Right 09/2012   Dr. Loyola Mast RELEASE Left 11/2013   Dr. Apolonio Schneiders   TUBAL LIGATION     uterine ablation      There were no vitals filed for this visit.   Subjective Assessment - 01/02/22 1018     Subjective going good    Patient Stated Goals improve ROM and strength    Currently in Pain? No/denies                [facility] PT Assessment - 01/02/22 0001       Assessment   Medical Diagnosis Rt shoulder arthroscopy with subacromial decompression with distal clavicle extension, biceps tenodesis    Referring Provider (PT) Griffin Basil    Onset Date/Surgical Date 11/17/21    Hand Dominance Right      Precautions   Precaution Comments per protocol      Observation/Other Assessments   Focus on Therapeutic Outcomes (FOTO)  54 functional score; goal 71      AROM   Right Shoulder Flexion 130 Degrees     Right Shoulder ABduction 85 Degrees    Right Shoulder Internal Rotation 57 Degrees    Right Shoulder External Rotation 60 Degrees                           [facility] Adult PT Treatment/Exercise - 01/02/22 0001       Shoulder Exercises: Pulleys   Flexion 2 minutes    Scaption 2 minutes      Shoulder Exercises: Isometric Strengthening   Flexion Limitations reactive isometric red TB   10 reps, 3 sec hold   Extension Limitations reactive isometric red TB   10 reps, 3 sec hold   External Rotation Limitations reactive isometric, red TB   10 reps, 3 sec hold   Internal Rotation Limitations reactive isometric red TB   10 reps, 3 sec hold     Manual Therapy   Passive ROM Rt shoulder flex, ER/IR and abduction                          PT Long Term Goals - 01/02/22 1046  Lumbar spondylosis 03/06/2015   DDD (degenerative disc disease), lumbosacral 03/06/2015   Microscopic hematuria 06/06/2013   Carpal tunnel syndrome 04/04/2012   URINARY INCONTINENCE, URGE 09/24/2008   ASTHMA, INTERMITTENT, MILD 02/06/2008   DEPRESSION, SEVERE 08/25/2007    Caidence Higashi, PT 01/02/2022, 10:54 AM  [facility] [place], [place], [address] Phone: [phone]   Fax:  [phone]  Name: Michaela Dodson MRN: [id number] Date of Birth: 30-Apr-1971  [place], [place], [address] Phone: [phone]   Fax:  [phone]  Physical Therapy Treatment and Recertification  Patient Details  Name: Michaela Dodson MRN: [id number] Date of Birth: August 07, 1971 Referring Provider (PT): Griffin Basil   Encounter Date: 01/02/2022   PT End of Session - 01/02/22 1048     Visit Number 7    Number of Visits 18    Date for PT Re-Evaluation 02/13/22    PT Start Time 7412    PT Stop Time 1055    PT Time Calculation (min) 40 min    Activity Tolerance Patient tolerated treatment well    Behavior During Therapy [facility] for tasks assessed/performed             Past Medical History:  Diagnosis Date   Allergy    Depression    GERD (gastroesophageal reflux disease)    Pt states that this has gone away since her recent weight loss   H/O thromboembolism    superficial-left medial thigh lipoma, vericose veins   Obesity    Pt has lost the weight and is no longer obese    Past Surgical History:  Procedure Laterality Date   BREAST BIOPSY     CARPAL TUNNEL RELEASE Right 09/2012   Dr. Loyola Mast RELEASE Left 11/2013   Dr. Apolonio Schneiders   TUBAL LIGATION     uterine ablation      There were no vitals filed for this visit.   Subjective Assessment - 01/02/22 1018     Subjective going good    Patient Stated Goals improve ROM and strength    Currently in Pain? No/denies                [facility] PT Assessment - 01/02/22 0001       Assessment   Medical Diagnosis Rt shoulder arthroscopy with subacromial decompression with distal clavicle extension, biceps tenodesis    Referring Provider (PT) Griffin Basil    Onset Date/Surgical Date 11/17/21    Hand Dominance Right      Precautions   Precaution Comments per protocol      Observation/Other Assessments   Focus on Therapeutic Outcomes (FOTO)  54 functional score; goal 71      AROM   Right Shoulder Flexion 130 Degrees     Right Shoulder ABduction 85 Degrees    Right Shoulder Internal Rotation 57 Degrees    Right Shoulder External Rotation 60 Degrees                           [facility] Adult PT Treatment/Exercise - 01/02/22 0001       Shoulder Exercises: Pulleys   Flexion 2 minutes    Scaption 2 minutes      Shoulder Exercises: Isometric Strengthening   Flexion Limitations reactive isometric red TB   10 reps, 3 sec hold   Extension Limitations reactive isometric red TB   10 reps, 3 sec hold   External Rotation Limitations reactive isometric, red TB   10 reps, 3 sec hold   Internal Rotation Limitations reactive isometric red TB   10 reps, 3 sec hold     Manual Therapy   Passive ROM Rt shoulder flex, ER/IR and abduction                          PT Long Term Goals - 01/02/22 1046

## 2022-01-05 ENCOUNTER — Ambulatory Visit: Payer: Medicare Other | Admitting: Physical Therapy

## 2022-01-07 ENCOUNTER — Ambulatory Visit: Payer: Medicare Other | Admitting: Physical Therapy

## 2022-01-07 ENCOUNTER — Other Ambulatory Visit: Payer: Self-pay

## 2022-01-07 DIAGNOSIS — M6281 Muscle weakness (generalized): Secondary | ICD-10-CM | POA: Diagnosis not present

## 2022-01-07 DIAGNOSIS — Z9889 Other specified postprocedural states: Secondary | ICD-10-CM | POA: Diagnosis not present

## 2022-01-07 DIAGNOSIS — R600 Localized edema: Secondary | ICD-10-CM

## 2022-01-07 DIAGNOSIS — M25511 Pain in right shoulder: Secondary | ICD-10-CM | POA: Diagnosis not present

## 2022-01-07 NOTE — Therapy (Signed)
[facility] [place] [address] [place] [address] [place], [place], [address] Phone: [phone]   Fax:  [phone]  Physical Therapy Treatment  Patient Details  Name: Michaela Dodson MRN: [id number] Date of Birth: 03/21/1971 Referring Provider (PT): Everardo Pacific   Encounter Date: 01/07/2022   PT End of Session - 01/07/22 0917     Visit Number 8    Number of Visits 18    Date for PT Re-Evaluation 02/13/22    PT Start Time 0845    PT Stop Time 0918    PT Time Calculation (min) 33 min    Activity Tolerance Patient tolerated treatment well    Behavior During Therapy [facility] for tasks assessed/performed             Past Medical History:  Diagnosis Date   Allergy    Depression    GERD (gastroesophageal reflux disease)    Pt states that this has gone away since her recent weight loss   H/O thromboembolism    superficial-left medial thigh lipoma, vericose veins   Obesity    Pt has lost the weight and is no longer obese    Past Surgical History:  Procedure Laterality Date   BREAST BIOPSY     CARPAL TUNNEL RELEASE Right 09/2012   Dr. Mikle Bosworth RELEASE Left 11/2013   Dr. Orlan Leavens   TUBAL LIGATION     uterine ablation      There were no vitals filed for this visit.   Subjective Assessment - 01/07/22 0844     Subjective pt states she feels pain "when I over extend" but that she is feeling good overall    Patient Stated Goals improve ROM and strength    Currently in Pain? No/denies                [facility] PT Assessment - 01/07/22 0001       Assessment   Medical Diagnosis Rt shoulder arthroscopy with subacromial decompression with distal clavicle extension, biceps tenodesis    Referring Provider (PT) Everardo Pacific    Onset Date/Surgical Date 11/17/21    Hand Dominance Right      AROM   Right Shoulder Flexion 133 Degrees                           [facility] Adult PT Treatment/Exercise - 01/07/22 0001        Shoulder Exercises: Pulleys   Flexion 2 minutes    ABduction 2 minutes    ABduction Limitations to tolerance      Shoulder Exercises: Isometric Strengthening   Flexion Limitations reactive isometric red TB   10 reps, 3 sec hold   Extension Limitations reactive isometric red TB   10 reps, 3 sec hold   External Rotation Limitations reactive isometric, red TB   10 reps, 3 sec hold   Internal Rotation Limitations reactive isometric red TB   10 reps, 3 sec hold     Shoulder Exercises: Stretch   External Rotation Stretch 2 reps;30 seconds   doorway   Wall Stretch - ABduction 5 reps;10 seconds    Wall Stretch - ABduction Limitations finger ladder to tolerance      Manual Therapy   Passive ROM Rt shoulder flex, ER/IR and abduction                          PT Long Term Goals - 01/02/22 1046  PT LONG TERM GOAL #1   Title Pt will be independent wiht HEP    Time 6    Period Weeks    Status On-going    Target Date 02/13/22      PT LONG TERM GOAL #2   Title Pt will improve FOTO to >= to 71 to demo improved functional mobility    Baseline 54    Time 6    Period Weeks    Status On-going    Target Date 02/13/22      PT LONG TERM GOAL #3   Title Pt will improve Rt shoulder ROM to 140 degrees flexion, 60 degrees ER and 60 degrees IR to improve ability to dress without difficulty    Baseline shoulder flexion 130    Time 6    Period Weeks    Status On-going    Target Date 02/13/22      PT LONG TERM GOAL #4   Title Pt will improve Rt shoulder strength to 4/5 to perform household activities with decreased pain    Baseline 3/5    Time 6    Period Weeks    Status New    Target Date 02/13/22                   Plan - 01/07/22 0920     Clinical Impression Statement Pt with increased pain today with activies, she states she "overdid it" at home. Pt with slightly improving ROM. Added doorway ER stretch and wall stretch for abduction with good tolerance     PT Next Visit Plan progress per protocol.  Next Phase on 01/12/22.    PT Home Exercise Plan [id number]    Consulted and Agree with Plan of Care Patient             Patient will benefit from skilled therapeutic intervention in order to improve the following deficits and impairments:     Visit Diagnosis: Muscle weakness (generalized)  Acute pain of right shoulder  Localized edema     Problem List Patient Active Problem List   Diagnosis Date Noted   Right shoulder glenohumeral labral degenerative changes 09/18/2019   Family history of breast cancer in mother 09/06/2018   Varicose veins of left leg with edema 06/06/2018   Hyperbilirubinemia, suspect [place] Syndrome 09/16/2017   Postgastrectomy malabsorption 09/16/2017   Lipomatosis 04/27/2017   Bipolar 1 disorder, depressed, moderate (HCC) 03/29/2017   Primary osteoarthritis of both knees 10/14/2016   History of sleeve gastrectomy 09/18/2015   Radiculitis of right cervical region 09/16/2015   Acute superficial venous thrombosis of left lower extremity 09/16/2015   Former smoker 07/03/2015   Acid reflux 04/11/2015   H/O renal calculi 04/11/2015   Hypercholesterolemia without hypertriglyceridemia 04/11/2015   Disordered sleep 04/11/2015   Snores 04/11/2015   Lumbar spondylosis 03/06/2015   DDD (degenerative disc disease), lumbosacral 03/06/2015   Microscopic hematuria 06/06/2013   Carpal tunnel syndrome 04/04/2012   URINARY INCONTINENCE, URGE 09/24/2008   ASTHMA, INTERMITTENT, MILD 02/06/2008   DEPRESSION, SEVERE 08/25/2007    Ambriel Gorelick, PT 01/07/2022, 9:23 AM  [facility] [address] [place], [place], [address] Phone: [phone]   Fax:  [phone]  Name: YSABELLE WEDDING MRN: [id number] Date of Birth: Feb 21, 1971

## 2022-01-12 ENCOUNTER — Other Ambulatory Visit: Payer: Self-pay | Admitting: Sports Medicine

## 2022-01-12 DIAGNOSIS — F3132 Bipolar disorder, current episode depressed, moderate: Secondary | ICD-10-CM

## 2022-01-13 ENCOUNTER — Other Ambulatory Visit: Payer: Self-pay

## 2022-01-13 ENCOUNTER — Ambulatory Visit: Payer: Medicare Other | Admitting: Physical Therapy

## 2022-01-13 DIAGNOSIS — R600 Localized edema: Secondary | ICD-10-CM | POA: Diagnosis not present

## 2022-01-13 DIAGNOSIS — Z9889 Other specified postprocedural states: Secondary | ICD-10-CM | POA: Diagnosis not present

## 2022-01-13 DIAGNOSIS — M6281 Muscle weakness (generalized): Secondary | ICD-10-CM | POA: Diagnosis not present

## 2022-01-13 DIAGNOSIS — M25511 Pain in right shoulder: Secondary | ICD-10-CM

## 2022-01-13 NOTE — Therapy (Addendum)
[place], [place], [address] Phone: [phone]   Fax:  [phone]  Physical Therapy Treatment and Discharge  Patient Details  Name: Michaela Dodson MRN: [id number] Date of Birth: [birth year] Referring Provider (PT): Griffin Basil   Encounter Date: 01/13/2022   PT End of Session - 01/13/22 0831     Visit Number 9    Number of Visits 18    Date for PT Re-Evaluation 02/13/22    PT Start Time 0800    PT Stop Time 0832    PT Time Calculation (min) 32 min    Activity Tolerance Patient tolerated treatment well    Behavior During Therapy [facility] for tasks assessed/performed             Past Medical History:  Diagnosis Date   Allergy    Depression    GERD (gastroesophageal reflux disease)    Pt states that this has gone away since her recent weight loss   H/O thromboembolism    superficial-left medial thigh lipoma, vericose veins   Obesity    Pt has lost the weight and is no longer obese    Past Surgical History:  Procedure Laterality Date   BREAST BIOPSY     CARPAL TUNNEL RELEASE Right 09/2012   Dr. Loyola Mast RELEASE Left 11/2013   Dr. Apolonio Schneiders   TUBAL LIGATION     uterine ablation      There were no vitals filed for this visit.   Subjective Assessment - 01/13/22 0806     Subjective Pt states she is feeling "great". She states she only feels sore when she is cleanign bathtubs    Patient Stated Goals improve ROM and strength    Currently in Pain? No/denies                [facility] PT Assessment - 01/13/22 0001       Assessment   Medical Diagnosis Rt shoulder arthroscopy with subacromial decompression with distal clavicle extension, biceps tenodesis    Referring Provider (PT) Griffin Basil    Onset Date/Surgical Date 11/17/21    Hand Dominance Right      AROM   Right Shoulder Flexion 140 Degrees    Right Shoulder ABduction 85 Degrees    Right Shoulder Internal Rotation 55  Degrees    Right Shoulder External Rotation 60 Degrees                           [facility] Adult PT Treatment/Exercise - 01/13/22 0001       Shoulder Exercises: Pulleys   Flexion 2 minutes      Shoulder Exercises: ROM/Strengthening   UBE (Upper Arm Bike) 2 min alt fwd/bkwd L2      Shoulder Exercises: Stretch   External Rotation Stretch 2 reps;30 seconds   doorway   Table Stretch - Abduction 3 reps;20 seconds    Table Stretch - ABduction Limitations standing with arm on counter    Other Shoulder Stretches shoulder extension holding onto counter 3 x 30 sec      Manual Therapy   Passive ROM Rt shoulder flex, ER/IR and abduction                     PT Education - 01/13/22 0831     Education Details shoulder stretches    Person(s) Educated Patient    Methods Explanation;Demonstration    Comprehension Returned demonstration;Verbalized understanding  PT Long Term Goals - 01/13/22 0816       PT LONG TERM GOAL #1   Title Pt will be independent wiht HEP    Status On-going      PT LONG TERM GOAL #3   Title Pt will improve Rt shoulder ROM to 140 degrees flexion, 60 degrees ER and 60 degrees IR to improve ability to dress without difficulty    Baseline IR 57    Status On-going      PT LONG TERM GOAL #4   Baseline 3+/5    Status On-going                   Plan - 01/13/22 8250     Clinical Impression Statement Pt continiues with increased pain with stretching and manual ROM. PT educated pt on importance of continuing stretching and ice at home    PT Next Visit Plan progress per protocol    PT [place] and Agree with Plan of Care Patient             Patient will benefit from skilled therapeutic intervention in order to improve the following deficits and impairments:     Visit Diagnosis: Muscle weakness (generalized)  Acute pain of right shoulder  Localized  edema     Problem List Patient Active Problem List   Diagnosis Date Noted   Right shoulder glenohumeral labral degenerative changes 09/18/2019   Family history of breast cancer in mother 09/06/2018   Varicose veins of left leg with edema 06/06/2018   Hyperbilirubinemia, suspect [place] Syndrome 09/16/2017   Postgastrectomy malabsorption 09/16/2017   Lipomatosis 04/27/2017   Bipolar 1 disorder, depressed, moderate ([place]) 03/29/2017   Primary osteoarthritis of both knees 10/14/2016   History of sleeve gastrectomy 09/18/2015   Radiculitis of right cervical region 09/16/2015   Acute superficial venous thrombosis of left lower extremity 09/16/2015   Former smoker 07/03/2015   Acid reflux 04/11/2015   H/O renal calculi 04/11/2015   Hypercholesterolemia without hypertriglyceridemia 04/11/2015   Disordered sleep 04/11/2015   Snores 04/11/2015   Lumbar spondylosis 03/06/2015   DDD (degenerative disc disease), lumbosacral 03/06/2015   Microscopic hematuria 06/06/2013   Carpal tunnel syndrome 04/04/2012   URINARY INCONTINENCE, URGE 09/24/2008   ASTHMA, INTERMITTENT, MILD 02/06/2008   DEPRESSION, SEVERE 08/25/2007  PHYSICAL THERAPY DISCHARGE SUMMARY  Visits from Start of Care: 9  Current functional level related to goals / functional outcomes: Improved ROM, strength and functional mobility   Remaining deficits: See above   Education / Equipment: HEP   Patient agrees to discharge. Patient goals were partially met. Patient is being discharged due to being pleased with the current functional level. [name], PT,DPT02/07/238:34 AM    [name], PT 01/13/2022, 8:33 AM  [facility] [place], [place], [address] Phone: [phone]   Fax:  [phone]  Name: ELZABETH MCQUERRY MRN: [id number] Date of Birth: 24-Sep-1971

## 2022-01-27 DIAGNOSIS — M19011 Primary osteoarthritis, right shoulder: Secondary | ICD-10-CM | POA: Diagnosis not present

## 2022-01-31 ENCOUNTER — Encounter: Payer: Self-pay | Admitting: Sports Medicine

## 2022-02-01 ENCOUNTER — Encounter: Payer: Self-pay | Admitting: Family Medicine

## 2022-02-02 ENCOUNTER — Telehealth (INDEPENDENT_AMBULATORY_CARE_PROVIDER_SITE_OTHER): Payer: Medicare Other | Admitting: Family Medicine

## 2022-02-02 ENCOUNTER — Other Ambulatory Visit: Payer: Self-pay

## 2022-02-02 ENCOUNTER — Encounter: Payer: Self-pay | Admitting: Family Medicine

## 2022-02-02 VITALS — Ht 66.0 in | Wt 175.0 lb

## 2022-02-02 DIAGNOSIS — H10022 Other mucopurulent conjunctivitis, left eye: Secondary | ICD-10-CM | POA: Diagnosis not present

## 2022-02-02 DIAGNOSIS — F3132 Bipolar disorder, current episode depressed, moderate: Secondary | ICD-10-CM

## 2022-02-02 MED ORDER — DULOXETINE HCL 60 MG PO CPEP: 60.0000 mg | ORAL_CAPSULE | Freq: Every day | ORAL | 1 refills | Status: DC

## 2022-02-02 MED ORDER — ERYTHROMYCIN 5 MG/GM OP OINT: 1.0000 "application " | TOPICAL_OINTMENT | Freq: Four times a day (QID) | OPHTHALMIC | 0 refills | Status: DC

## 2022-02-02 NOTE — Telephone Encounter (Signed)
Patient has been scheduled for a virtual visit. AM

## 2022-02-02 NOTE — Progress Notes (Signed)
° ° °  Virtual Visit via Video Note  I connected with Michaela Dodson on 02/02/22 at 11:30 AM EST by a video enabled telemedicine application and verified that I am speaking with the correct person using two identifiers.   I discussed the limitations of evaluation and management by telemedicine and the availability of in person appointments. The patient expressed understanding and agreed to proceed.  Patient location: at home Provider location: in office  Subjective:    CC:   Chief Complaint  Patient presents with   Eye Drainage    Left eye, blurry vision, 1 day. Patient states she has been babysitting her grandchildren who always has pink eye.     HPI:  Left eye, blurry vision, 1 day. Patient states she has been babysitting her grandchildren who have had pink eye.  She says it feels watery but she has not necessarily noticed a lot of drainage.  She did warm compresses this morning.  No other upper respiratory or cold symptoms.  Though her grandchildren have been sick with strep throat etc. she says the eye is uncomfortable but not painful.   Past medical history, Surgical history, Family history not pertinant except as noted below, Social history, Allergies, and medications have been entered into the medical record, reviewed, and corrections made.    Objective:    General: Speaking clearly in complete sentences without any shortness of breath.  Alert and oriented x3.  Normal judgment. No apparent acute distress.  Left eyelids look swollen.     Impression and Recommendations:    Problem List Items Addressed This Visit   None Visit Diagnoses     Pink eye disease of left eye    -  Primary   Relevant Medications   erythromycin ophthalmic ointment      Most consistent with conjunctivitis of the left eye-we discussed that this is most often viral.  But we will go ahead and treat with erythromycin ophthalmic ointment.  Call if any problems concerns or not improving.  Did  encourage her to let us know immediately if she develops any severe pain or significant vision changes.  Wash hands well after touching or dressing the eye.  No orders of the defined types were placed in this encounter.   Meds ordered this encounter  Medications   erythromycin ophthalmic ointment    Sig: Place 1 application into the left eye 4 (four) times daily. X 7 days    Dispense:  3.5 g    Refill:  0     I discussed the assessment and treatment plan with the patient. The patient was provided an opportunity to ask questions and all were answered. The patient agreed with the plan and demonstrated an understanding of the instructions.   The patient was advised to call back or seek an in-person evaluation if the symptoms worsen or if the condition fails to improve as anticipated.  I spent 15 minutes on the day of the encounter to include pre-visit record review, face-to-face time with the patient and post visit ordering of test. e  [name], MD

## 2022-02-13 IMAGING — DX DG CERVICAL SPINE COMPLETE 4+V
6 series · 6 of 6 positions shown · non-contrast
Comparison: Cervical spine radiographs 09/16/2015

CLINICAL DATA: Right cervical pain extending to the scapula.

EXAM:
CERVICAL SPINE - COMPLETE 4+ VIEW

[c-spine lat]
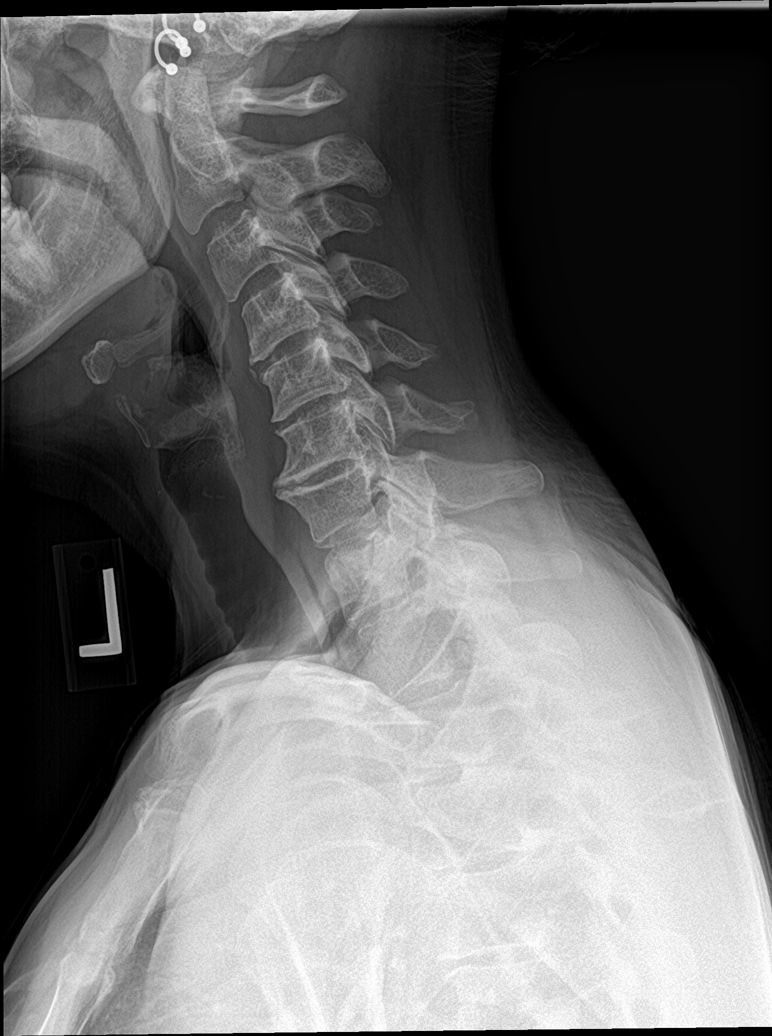

[c-spine obl (1 of 2)]
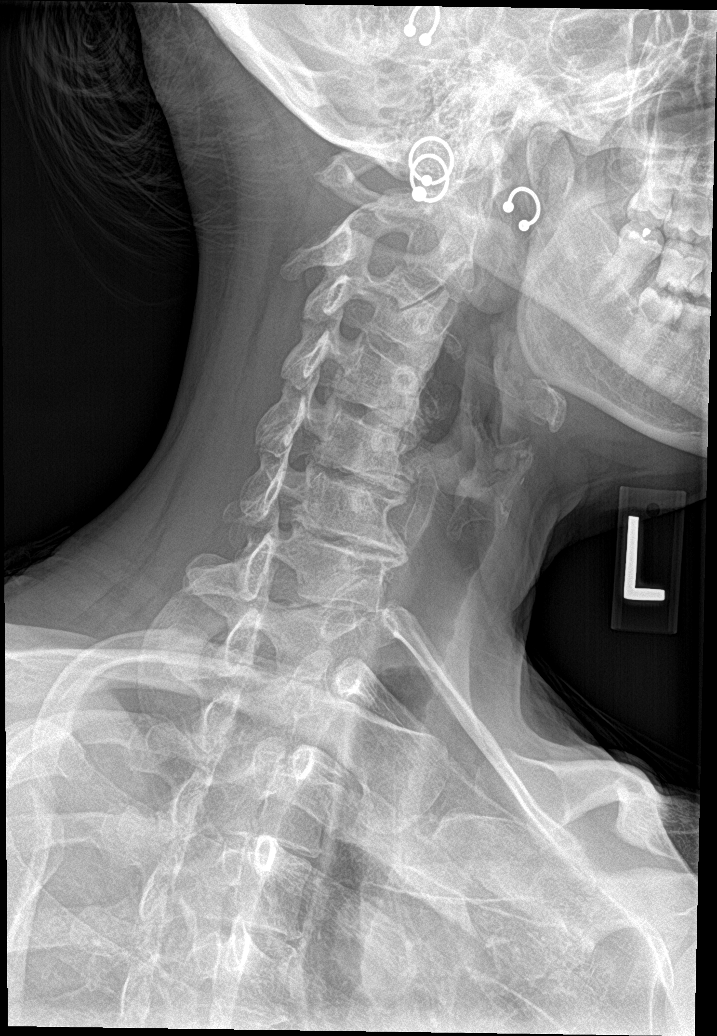

[c-spine obl (2 of 2)]
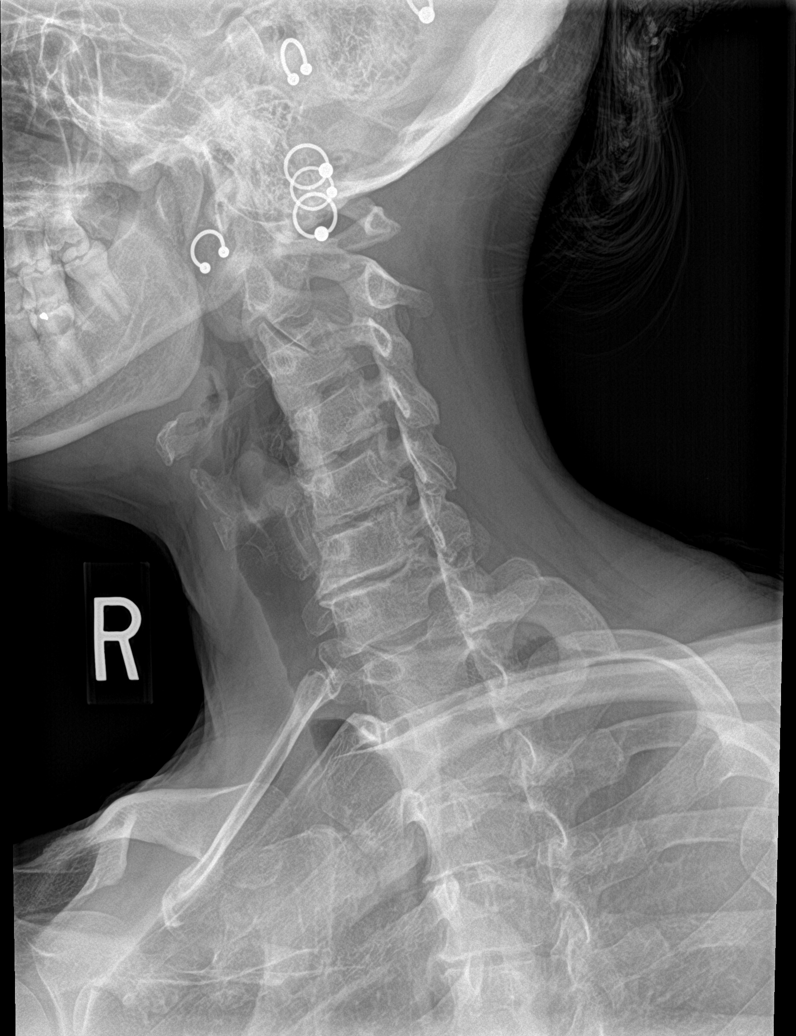

[c-spine ap]
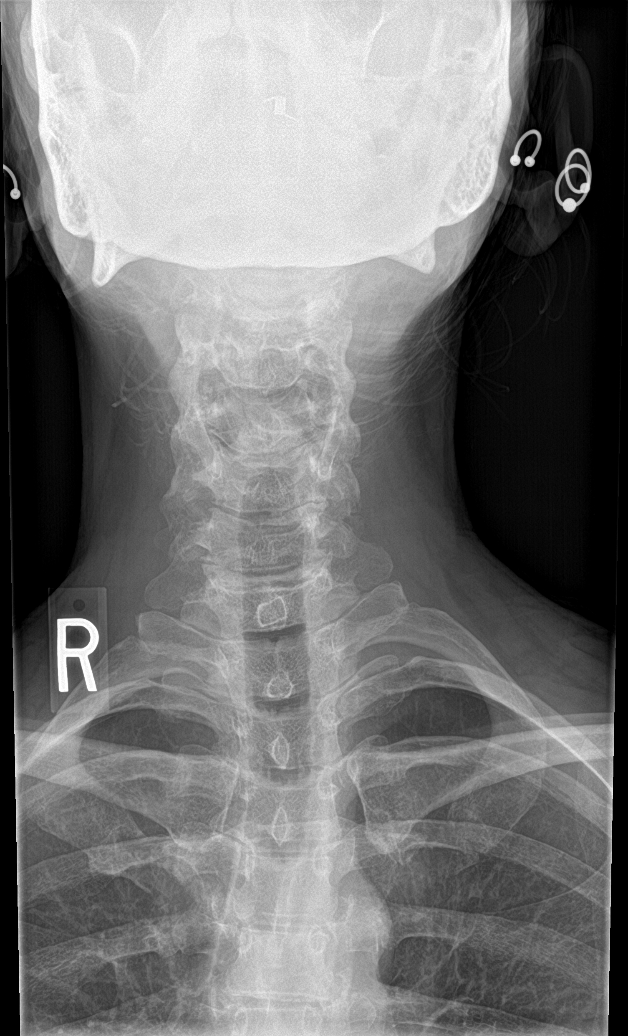

[c-spine open mouth]
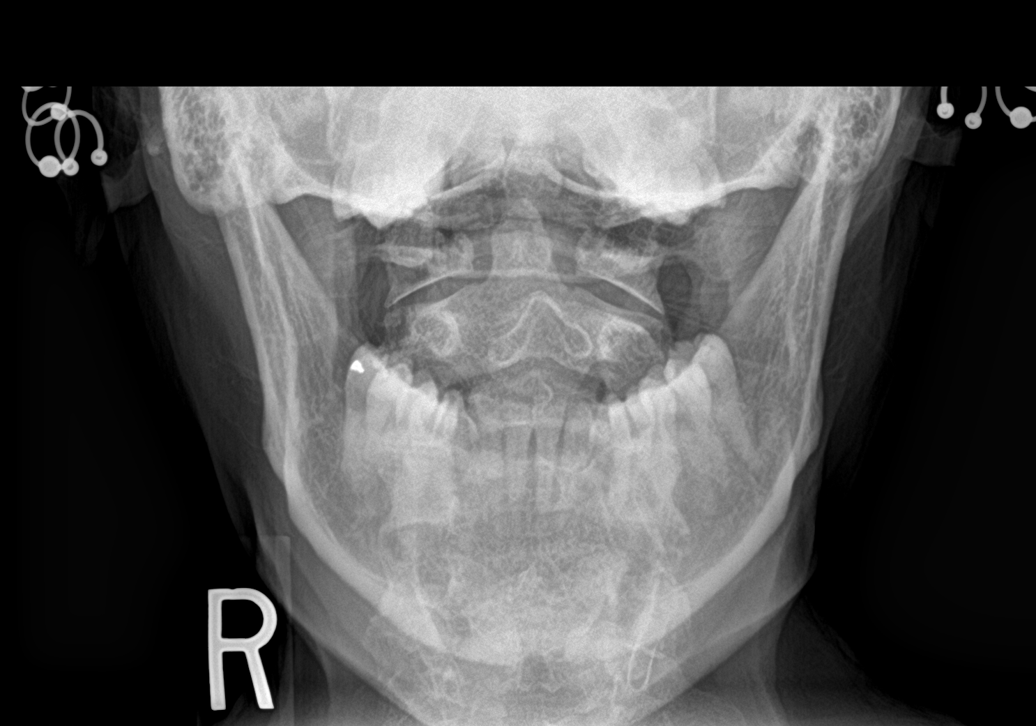

[c-spine swimmers]
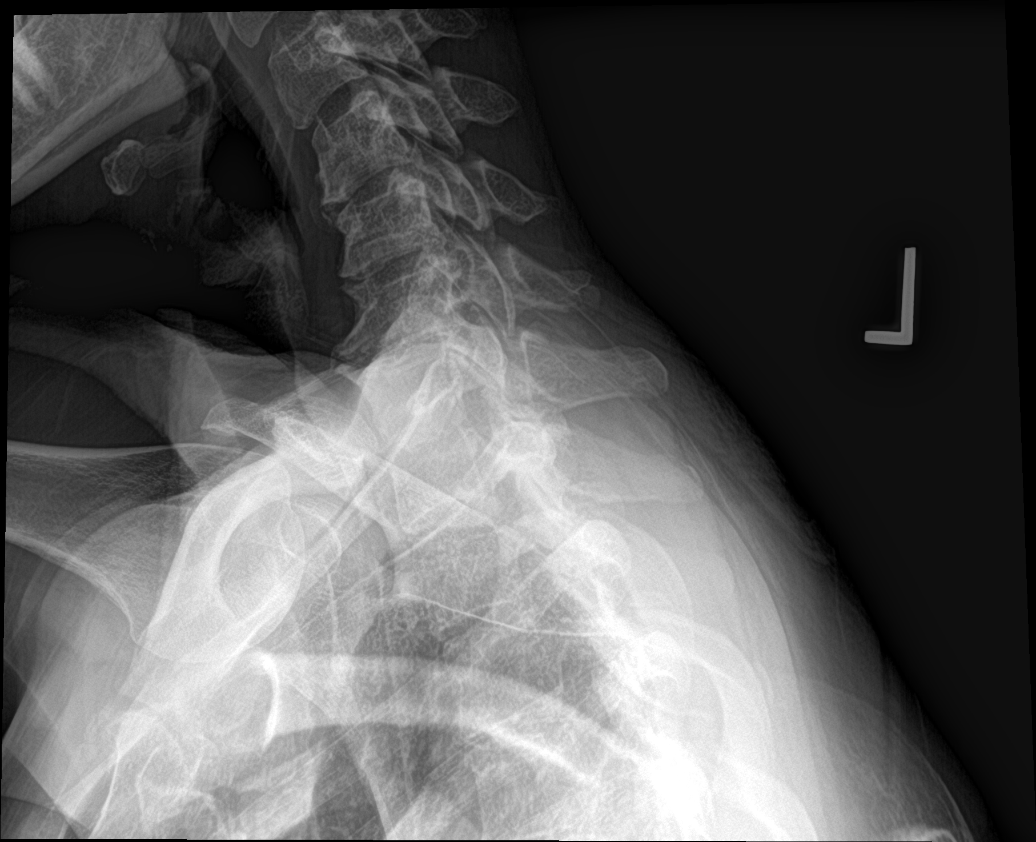

[6 of 6 positions shown; findings below may reference images not displayed]

FINDINGS: The prevertebral soft tissues are within normal limits. No
significant listhesis is present. Straightening of the normal
cervical lordosis again seen. Degenerative disc disease and
uncovertebral spurring is most pronounced at C5-6 and C6-7
progression since prior exam. Osseous foraminal narrowing is worse
on the left at both levels.
IMPRESSION: 1. Progressive degenerative disc disease and uncovertebral spurring
at C5-6 and C6-7.
2. Osseous foraminal narrowing bilaterally at C5-6 and C6-7 is worse
on the left.

## 2022-03-02 DIAGNOSIS — F3110 Bipolar disorder, current episode manic without psychotic features, unspecified: Secondary | ICD-10-CM | POA: Diagnosis not present

## 2022-06-01 DIAGNOSIS — F3110 Bipolar disorder, current episode manic without psychotic features, unspecified: Secondary | ICD-10-CM | POA: Diagnosis not present

## 2022-06-24 DIAGNOSIS — H5213 Myopia, bilateral: Secondary | ICD-10-CM | POA: Diagnosis not present

## 2022-07-28 DIAGNOSIS — K912 Postsurgical malabsorption, not elsewhere classified: Secondary | ICD-10-CM | POA: Diagnosis not present

## 2022-07-28 DIAGNOSIS — Z903 Acquired absence of stomach [part of]: Secondary | ICD-10-CM | POA: Diagnosis not present

## 2022-07-29 ENCOUNTER — Other Ambulatory Visit: Payer: Self-pay | Admitting: Sports Medicine

## 2022-07-29 DIAGNOSIS — F3132 Bipolar disorder, current episode depressed, moderate: Secondary | ICD-10-CM

## 2022-08-04 ENCOUNTER — Ambulatory Visit (INDEPENDENT_AMBULATORY_CARE_PROVIDER_SITE_OTHER): Payer: Medicare Other | Admitting: Sports Medicine

## 2022-08-04 ENCOUNTER — Encounter: Payer: Self-pay | Admitting: Sports Medicine

## 2022-08-04 DIAGNOSIS — M19011 Primary osteoarthritis, right shoulder: Secondary | ICD-10-CM | POA: Diagnosis not present

## 2022-08-04 DIAGNOSIS — F3132 Bipolar disorder, current episode depressed, moderate: Secondary | ICD-10-CM

## 2022-08-04 MED ORDER — DULOXETINE HCL 60 MG PO CPEP: 60.0000 mg | ORAL_CAPSULE | Freq: Every day | ORAL | 3 refills | Status: DC

## 2022-08-04 NOTE — Progress Notes (Signed)
    Procedures performed today:    None.  Independent interpretation of notes and tests performed by another provider:   None.  Brief History, Exam, Impression, and Recommendations:    Right shoulder glenohumeral labral degenerative changes Pleasant 51 year old female returns, I have not seen her in over a year, she has glenohumeral osteoarthritis and labral degenerative changes, we last did an injection in June 2022, she had an arthroscopy with Dr. Griffin Basil and continues to do well, no further injections needed. Return as needed.  Bipolar 1 disorder, depressed, moderate ([place]) Thu has also been on Cymbalta for a long time, symptoms are extremely well controlled, we know she has a diagnosis of bipolar disorder, and she will keep a close eye out for any manic symptoms, I think her PCP can take over prescriptions of the Cymbalta from now on.    ____________________________________________ Gwen Her. Dianah Field, M.D., ABFM., CAQSM., AME. Primary Care and Sports Medicine [facility]  Adjunct Professor of [employer]  Risk manager

## 2022-08-04 NOTE — Assessment & Plan Note (Signed)
Pleasant 51 year old female returns, I have not seen her in over a year, she has glenohumeral osteoarthritis and labral degenerative changes, we last did an injection in June 2022, she had an arthroscopy with Dr. Griffin Basil and continues to do well, no further injections needed. Return as needed.

## 2022-08-04 NOTE — Assessment & Plan Note (Signed)
Michaela Dodson has also been on Cymbalta for a long time, symptoms are extremely well controlled, we know she has a diagnosis of bipolar disorder, and she will keep a close eye out for any manic symptoms, I think her PCP can take over prescriptions of the Cymbalta from now on.

## 2022-08-06 ENCOUNTER — Encounter: Payer: Self-pay | Admitting: Family Medicine

## 2022-08-07 NOTE — Telephone Encounter (Signed)
Can we reach out to [facility] for the Psyh med panel for her. Dx. Bipolar 1

## 2022-08-07 NOTE — Telephone Encounter (Signed)
Michaela Dodson's order has been placed.  We will ship a kit to them within 48 hours.  For more information about [facility], visit [email or web address].  Order placed. Dr. Madilyn Fireman - Juluis Rainier

## 2022-08-11 DIAGNOSIS — K912 Postsurgical malabsorption, not elsewhere classified: Secondary | ICD-10-CM | POA: Diagnosis not present

## 2022-08-11 DIAGNOSIS — N3946 Mixed incontinence: Secondary | ICD-10-CM | POA: Diagnosis not present

## 2022-08-11 DIAGNOSIS — Z903 Acquired absence of stomach [part of]: Secondary | ICD-10-CM | POA: Diagnosis not present

## 2022-08-11 DIAGNOSIS — F3132 Bipolar disorder, current episode depressed, moderate: Secondary | ICD-10-CM | POA: Diagnosis not present

## 2022-08-19 ENCOUNTER — Encounter: Payer: Self-pay | Admitting: General Practice

## 2022-08-24 DIAGNOSIS — F3110 Bipolar disorder, current episode manic without psychotic features, unspecified: Secondary | ICD-10-CM | POA: Diagnosis not present

## 2022-08-27 ENCOUNTER — Telehealth: Payer: Self-pay | Admitting: Family Medicine

## 2022-08-27 NOTE — Telephone Encounter (Signed)
Call patient and let her know that her [facility] report is back.  She is welcome to come pick up a copy if she would like.  Each page has 3 columns.  The column on the left arm medications that she can use as usual.  But calm on the right arm medications that they recommend that she avoid.  And the ones in the middle column can be used with some caution and may just need to be monitored or adjusted differently.  The great news is that she has very few medications that she cannot use.

## 2022-08-28 ENCOUNTER — Encounter: Payer: Self-pay | Admitting: Family Medicine

## 2022-09-02 NOTE — Telephone Encounter (Signed)
Patient was contact on 08/28/22 regarding the results. Informed that a copy is available to be pick up at the front desk. Patient had no other inquiries during the call.

## 2022-09-18 DIAGNOSIS — Z1231 Encounter for screening mammogram for malignant neoplasm of breast: Secondary | ICD-10-CM | POA: Diagnosis not present

## 2022-09-21 LAB — HM MAMMOGRAPHY

## 2022-09-24 ENCOUNTER — Encounter: Payer: Self-pay | Admitting: Family Medicine

## 2022-09-24 NOTE — Progress Notes (Signed)
09/22/ 

## 2022-09-28 ENCOUNTER — Ambulatory Visit (INDEPENDENT_AMBULATORY_CARE_PROVIDER_SITE_OTHER): Payer: Medicare Other | Admitting: Family Medicine

## 2022-09-28 DIAGNOSIS — Z0000 Encounter for general adult medical examination without abnormal findings: Secondary | ICD-10-CM | POA: Diagnosis not present

## 2022-09-28 DIAGNOSIS — Z1211 Encounter for screening for malignant neoplasm of colon: Secondary | ICD-10-CM

## 2022-09-28 NOTE — Patient Instructions (Addendum)
[facility] Maintenance Summary and Written Plan of Care  Michaela Dodson ,  Thank you for allowing me to perform your Medicare Annual Wellness Visit and for your ongoing commitment to your health.   Health Maintenance & Immunization History Health Maintenance  Topic Date Due   COVID-19 Vaccine (1) 10/14/2022 (Originally 07/27/1971)   Zoster Vaccines- Shingrix (1 of 2) 12/29/2022 (Originally 01/26/2021)   COLONOSCOPY (Pts 45-68yr Insurance coverage will need to be confirmed)  09/29/2023 (Originally 01/27/2016)   PAP SMEAR-Modifier  08/16/2024   MAMMOGRAM  09/21/2024   TETANUS/TDAP  09/17/2029   Hepatitis C Screening  Completed   HIV Screening  Completed   Pneumococcal Vaccine 51Years old  Aged Out   HPV VACCINES  Aged Out   INFLUENZA VACCINE  Discontinued   Immunization History  Administered Date(s) Administered   Influenza Whole 09/24/2008   Influenza-Unspecified 09/08/2017   Pneumococcal Polysaccharide-23 04/04/2012   Td 12/28/2005   Tdap 09/18/2019    These are the patient goals that we discussed:  Goals Addressed               This Visit's Progress     Patient Stated (pt-stated)        09/28/2022 AWV Goal: Exercise for General Health  Patient will verbalize understanding of the benefits of increased physical activity: Exercising regularly is important. It will improve your overall fitness, flexibility, and endurance. Regular exercise also will improve your overall health. It can help you control your weight, reduce stress, and improve your bone density. Over the next year, patient will increase physical activity as tolerated with a goal of at least 150 minutes of moderate physical activity per week.  You can tell that you are exercising at a moderate intensity if your heart starts beating faster and you start breathing faster but can still hold a conversation. Moderate-intensity exercise ideas include: Walking 1 mile (1.6 km) in about 15  minutes Biking Hiking Golfing Dancing Water aerobics Patient will verbalize understanding of everyday activities that increase physical activity by providing examples like the following: Yard work, such as: PSales promotion account executiveGardening Washing windows or floors Patient will be able to explain general safety guidelines for exercising:  Before you start a new exercise program, talk with your health care provider. Do not exercise so much that you hurt yourself, feel dizzy, or get very short of breath. Wear comfortable clothes and wear shoes with good support. Drink plenty of water while you exercise to prevent dehydration or heat stroke. Work out until your breathing and your heartbeat get faster.          This is a list of Health Maintenance Items that are overdue or due now: Colorectal cancer screening Shingrix vaccines  Orders/Referrals Placed Today: Orders Placed This Encounter  Procedures   Cologuard   (Contact our referral department at [phone] you have not spoken with someone about your referral appointment within the next 5 days)    Follow-up Plan Follow-up with MHali Marry MD as planned Schedule your shingles vaccine at the pharmacy. Medicare wellness visit in one year.  Patient will access AVS on my chart.      Health Maintenance, Female Adopting a healthy lifestyle and getting preventive care are important in promoting health and wellness. Ask your health care provider about: The right schedule for you to have regular tests and exams. Things you can do on  your own to prevent diseases and keep yourself healthy. What should I know about diet, weight, and exercise? Eat a healthy diet  Eat a diet that includes plenty of vegetables, fruits, low-fat dairy products, and lean protein. Do not eat a lot of foods that are high in solid fats, added sugars, or  sodium. Maintain a healthy weight Body mass index (BMI) is used to identify weight problems. It estimates body fat based on height and weight. Your health care provider can help determine your BMI and help you achieve or maintain a healthy weight. Get regular exercise Get regular exercise. This is one of the most important things you can do for your health. Most adults should: Exercise for at least 150 minutes each week. The exercise should increase your heart rate and make you sweat (moderate-intensity exercise). Do strengthening exercises at least twice a week. This is in addition to the moderate-intensity exercise. Spend less time sitting. Even light physical activity can be beneficial. Watch cholesterol and blood lipids Have your blood tested for lipids and cholesterol at 51 years of age, then have this test every 5 years. Have your cholesterol levels checked more often if: Your lipid or cholesterol levels are high. You are older than 51 years of age. You are at high risk for heart disease. What should I know about cancer screening? Depending on your health history and family history, you may need to have cancer screening at various ages. This may include screening for: Breast cancer. Cervical cancer. Colorectal cancer. Skin cancer. Lung cancer. What should I know about heart disease, diabetes, and high blood pressure? Blood pressure and heart disease High blood pressure causes heart disease and increases the risk of stroke. This is more likely to develop in people who have high blood pressure readings or are overweight. Have your blood pressure checked: Every 3-5 years if you are 51-89 years of age. Every year if you are 51 years old or older. Diabetes Have regular diabetes screenings. This checks your fasting blood sugar level. Have the screening done: Once every three years after age 51 if you are at a normal weight and have a low risk for diabetes. More often and at a younger  age if you are overweight or have a high risk for diabetes. What should I know about preventing infection? Hepatitis B If you have a higher risk for hepatitis B, you should be screened for this virus. Talk with your health care provider to find out if you are at risk for hepatitis B infection. Hepatitis C Testing is recommended for: Everyone born from [birth year] through 1965. Anyone with known risk factors for hepatitis C. Sexually transmitted infections (STIs) Get screened for STIs, including gonorrhea and chlamydia, if: You are sexually active and are younger than 51 years of age. You are older than 51 years of age and your health care provider tells you that you are at risk for this type of infection. Your sexual activity has changed since you were last screened, and you are at increased risk for chlamydia or gonorrhea. Ask your health care provider if you are at risk. Ask your health care provider about whether you are at high risk for HIV. Your health care provider may recommend a prescription medicine to help prevent HIV infection. If you choose to take medicine to prevent HIV, you should first get tested for HIV. You should then be tested every 3 months for as long as you are taking the medicine. Pregnancy If you are about  to stop having your period (premenopausal) and you may become pregnant, seek counseling before you get pregnant. Take 400 to 800 micrograms (mcg) of folic acid every day if you become pregnant. Ask for birth control (contraception) if you want to prevent pregnancy. Osteoporosis and menopause Osteoporosis is a disease in which the bones lose minerals and strength with aging. This can result in bone fractures. If you are 47 years old or older, or if you are at risk for osteoporosis and fractures, ask your health care provider if you should: Be screened for bone loss. Take a calcium or vitamin D supplement to lower your risk of fractures. Be given hormone replacement therapy  (HRT) to treat symptoms of menopause. Follow these instructions at home: Alcohol use Do not drink alcohol if: Your health care provider tells you not to drink. You are pregnant, may be pregnant, or are planning to become pregnant. If you drink alcohol: Limit how much you have to: 0-1 drink a day. Know how much alcohol is in your drink. In the [place] one drink equals one 12 oz bottle of beer (355 mL), one 5 oz glass of wine (148 mL), or one 1 oz glass of hard liquor (44 mL). Lifestyle Do not use any products that contain nicotine or tobacco. These products include cigarettes, chewing tobacco, and vaping devices, such as e-cigarettes. If you need help quitting, ask your health care provider. Do not use street drugs. Do not share needles. Ask your health care provider for help if you need support or information about quitting drugs. General instructions Schedule regular health, dental, and eye exams. Stay current with your vaccines. Tell your health care provider if: You often feel depressed. You have ever been abused or do not feel safe at home. Summary Adopting a healthy lifestyle and getting preventive care are important in promoting health and wellness. Follow your health care provider's instructions about healthy diet, exercising, and getting tested or screened for diseases. Follow your health care provider's instructions on monitoring your cholesterol and blood pressure. This information is not intended to replace advice given to you by your health care provider. Make sure you discuss any questions you have with your health care provider. Document Revised: 05/05/2021 Document Reviewed: 05/05/2021 Elsevier Patient Education  [place].

## 2022-09-28 NOTE — Progress Notes (Signed)
MEDICARE ANNUAL WELLNESS VISIT  09/28/2022  Telephone Visit Disclaimer This Medicare AWV was conducted by telephone due to national recommendations for restrictions regarding the COVID-19 Pandemic (e.g. social distancing).  I verified, using two identifiers, that I am speaking with Michaela Dodson or their authorized healthcare agent. I discussed the limitations, risks, security, and privacy concerns of performing an evaluation and management service by telephone and the potential availability of an in-person appointment in the future. The patient expressed understanding and agreed to proceed.  Location of Patient: Home Location of Provider (nurse):  In the office.  Subjective:    Michaela Dodson is a 51 y.o. female patient of Metheney, Rene Kocher, MD who had a Medicare Annual Wellness Visit today via telephone. Michaela Dodson is Disabled and lives with their family. she has 3 children. she reports that she is socially active and does interact with friends/family regularly. she is minimally physically active and enjoys spending time with grandchildren.  Patient Care Team: Hali Marry, MD as PCP - General (Family Medicine) Sabino Snipes., MD as Referring Physician (Surgery)     09/28/2022    8:06 AM 11/26/2021    8:33 AM 09/20/2019    8:49 AM  Advanced Directives  Does Patient Have a Medical Advance Directive? Yes Yes No  Type of Advance Directive Living will    Does patient want to make changes to medical advance directive? No - Patient declined    Would patient like information on creating a medical advance directive?   No - Patient declined    Hospital Utilization Over the Past 12 Months: # of hospitalizations or ER visits: 0 # of surgeries: 1  Review of Systems    Patient reports that her overall health is better compared to last year.  History obtained from chart review and the patient  Patient Reported Readings (BP, Pulse, CBG, Weight, etc) none  Pain  Assessment Pain : No/denies pain     Current Medications & Allergies (verified) Allergies as of 09/28/2022   No Known Allergies      Medication List        Accurate as of September 28, 2022  8:24 AM. If you have any questions, ask your nurse or doctor.          Adderall XR 30 MG 24 hr capsule Generic drug: amphetamine-dextroamphetamine Take 30 mg by mouth every morning.   B-12 PO Take by mouth.   buPROPion 150 MG 24 hr tablet Commonly known as: WELLBUTRIN XL Take 150 mg by mouth every morning.   DULoxetine 60 MG capsule Commonly known as: CYMBALTA Take 1 capsule (60 mg total) by mouth daily.   propranolol 10 MG tablet Commonly known as: INDERAL Take 10 mg by mouth 2 (two) times daily as needed.   risperiDONE 3 MG tablet Commonly known as: RISPERDAL Take 1.5 tablets by mouth daily.   trospium 20 MG tablet Commonly known as: SANCTURA Take 20 mg by mouth 2 (two) times daily.   Vitamin D (Ergocalciferol) 1.25 MG (50000 UNIT) Caps capsule Commonly known as: DRISDOL Take by mouth once a week.        History (reviewed): Past Medical History:  Diagnosis Date   Allergy    Depression    GERD (gastroesophageal reflux disease)    Pt states that this has gone away since her recent weight loss   H/O thromboembolism    superficial-left medial thigh lipoma, vericose veins   Obesity    Pt has lost  the weight and is no longer obese   Past Surgical History:  Procedure Laterality Date   BREAST BIOPSY     CARPAL TUNNEL RELEASE Right 09/2012   Dr. Loyola Mast RELEASE Left 11/2013   Dr. Apolonio Schneiders   TUBAL LIGATION     uterine ablation     Family History  Problem Relation Age of Onset   Breast cancer Mother    Bladder Cancer Mother    Colon cancer Maternal Grandmother    Social History   Socioeconomic History   Marital status: Married    Spouse name: Jeneen Rinks   Number of children: 3   Years of education: 12   Highest education level: 12th grade   Occupational History   Occupation: Disbability  Tobacco Use   Smoking status: Former    Types: Cigarettes    Quit date: 07/18/2015    Years since quitting: 7.2   Smokeless tobacco: Never   Tobacco comments:    e-cigs  Vaping Use   Vaping Use: Every day   Substances: Nicotine  Substance and Sexual Activity   Alcohol use: Not Currently   Drug use: No   Sexual activity: Yes    Birth control/protection: None  Other Topics Concern   Not on file  Social History Narrative   Lives with her husband and her son. She enjoys spending time with grandchildren.   Social Determinants of Health   Financial Resource Strain: Low Risk  (09/28/2022)   Overall Financial Resource Strain (CARDIA)    Difficulty of Paying Living Expenses: Not hard at all  Food Insecurity: No Food Insecurity (09/28/2022)   Hunger Vital Sign    Worried About Running Out of Food in the Last Year: Never true    Ran Out of Food in the Last Year: Never true  Transportation Needs: No Transportation Needs (09/28/2022)   PRAPARE - Hydrologist (Medical): No    Lack of Transportation (Non-Medical): No  Physical Activity: Inactive (09/28/2022)   Exercise Vital Sign    Days of Exercise per Week: 0 days    Minutes of Exercise per Session: 0 min  Stress: No Stress Concern Present (09/28/2022)   [place]    Feeling of Stress : Not at all  Social Connections: Moderately Isolated (09/28/2022)   Social Connection and Isolation Panel [NHANES]    Frequency of Communication with Friends and Family: Three times a week    Frequency of Social Gatherings with Friends and Family: Three times a week    Attends Religious Services: Never    Active Member of Clubs or Organizations: No    Attends Archivist Meetings: Never    Marital Status: Married    Activities of Daily Living    09/28/2022    8:11 AM  In your present state of  health, do you have any difficulty performing the following activities:  Hearing? 0  Vision? 0  Difficulty concentrating or making decisions? 1  Comment has noticed some memory loss  Walking or climbing stairs? 0  Dressing or bathing? 0  Doing errands, shopping? 0  Preparing Food and eating ? N  Using the Toilet? N  In the past six months, have you accidently leaked urine? Y  Do you have problems with loss of bowel control? N  Managing your Medications? N  Managing your Finances? N  Housekeeping or managing your Housekeeping? N    Patient Education/ Literacy  How often do you need to have someone help you when you read instructions, pamphlets, or other written materials from your doctor or pharmacy?: 1 - Never What is the last grade level you completed in school?: 12th grade  Exercise Current Exercise Habits: The patient does not participate in regular exercise at present, Exercise limited by: None identified  Diet Patient reports consuming 2 meals a day and 0 snack(s) a day Patient reports that her primary diet is: Regular Patient reports that she does have regular access to food.   Depression Screen    09/28/2022    8:07 AM 02/02/2022   10:25 AM 09/18/2019    1:30 PM 12/13/2018    4:11 PM 09/06/2018    3:59 PM 08/17/2017   10:14 AM 03/29/2017   10:34 AM  PHQ 2/9 Scores  PHQ - 2 Score 0 0 '5 2 4 '$ 0 6  PHQ- 9 Score 0  '21 13 18  23     '$ Fall Risk    09/28/2022    8:07 AM 02/02/2022   10:24 AM 12/27/2019    8:18 AM 09/18/2019    1:30 PM  Fall Risk   Falls in the past year? 0 0 0 0  Number falls in past yr: 0 0 0 0  Injury with Fall? 0 0 0 0  Risk for fall due to : No Fall Risks No Fall Risks    Follow up Falls evaluation completed Falls prevention discussed;Falls evaluation completed       Objective:  Michaela Dodson seemed alert and oriented and she participated appropriately during our telephone visit.  Blood Pressure Weight BMI  BP Readings from Last 3 Encounters:   08/04/22 106/71  03/20/21 120/62  01/30/21 129/88   Wt Readings from Last 3 Encounters:  08/04/22 197 lb (89.4 kg)  02/02/22 175 lb (79.4 kg)  03/20/21 164 lb (74.4 kg)   BMI Readings from Last 1 Encounters:  08/04/22 31.80 kg/m    *Unable to obtain current vital signs, weight, and BMI due to telephone visit type  Hearing/Vision  [name] did not seem to have difficulty with hearing/understanding during the telephone conversation Reports that she has had a formal eye exam by an eye care professional within the past year Reports that she has not had a formal hearing evaluation within the past year *Unable to fully assess hearing and vision during telephone visit type  Cognitive Function:    09/28/2022    8:12 AM  6CIT Screen  What Year? 0 points  What month? 0 points  What time? 0 points  Count back from 20 0 points  Months in reverse 0 points  Repeat phrase 0 points  Total Score 0 points   (Normal:0-7, Significant for Dysfunction: >8)  Normal Cognitive Function Screening: Yes   Immunization & Health Maintenance Record Immunization History  Administered Date(s) Administered   Influenza Whole 09/24/2008   Influenza-Unspecified 09/08/2017   Pneumococcal Polysaccharide-23 04/04/2012   Td 12/28/2005   Tdap 09/18/2019    Health Maintenance  Topic Date Due   COVID-19 Vaccine (1) 10/14/2022 (Originally 07/27/1971)   Zoster Vaccines- Shingrix (1 of 2) 12/29/2022 (Originally 01/26/2021)   COLONOSCOPY (Pts 45-34yr Insurance coverage will need to be confirmed)  09/29/2023 (Originally 01/27/2016)   PAP SMEAR-Modifier  08/16/2024   MAMMOGRAM  09/21/2024   TETANUS/TDAP  09/17/2029   Hepatitis C Screening  Completed   HIV Screening  Completed   Pneumococcal Vaccine 158683Years old  Aged Out   HPV  VACCINES  Aged Out   INFLUENZA VACCINE  Discontinued       Assessment  This is a routine wellness examination for [facility].  Health Maintenance: Due or Overdue There  are no preventive care reminders to display for this patient.   Michaela Dodson does not need a referral for Community Assistance: Care Management:   no Social Work:    no Prescription Assistance:  no Nutrition/Diabetes Education:  no   Plan:  Personalized Goals  Goals Addressed               This Visit's Progress     Patient Stated (pt-stated)        09/28/2022 AWV Goal: Exercise for General Health  Patient will verbalize understanding of the benefits of increased physical activity: Exercising regularly is important. It will improve your overall fitness, flexibility, and endurance. Regular exercise also will improve your overall health. It can help you control your weight, reduce stress, and improve your bone density. Over the next year, patient will increase physical activity as tolerated with a goal of at least 150 minutes of moderate physical activity per week.  You can tell that you are exercising at a moderate intensity if your heart starts beating faster and you start breathing faster but can still hold a conversation. Moderate-intensity exercise ideas include: Walking 1 mile (1.6 km) in about 15 minutes Biking Hiking Golfing Dancing Water aerobics Patient will verbalize understanding of everyday activities that increase physical activity by providing examples like the following: Yard work, such as: Sales promotion account executive Gardening Washing windows or floors Patient will be able to explain general safety guidelines for exercising:  Before you start a new exercise program, talk with your health care provider. Do not exercise so much that you hurt yourself, feel dizzy, or get very short of breath. Wear comfortable clothes and wear shoes with good support. Drink plenty of water while you exercise to prevent dehydration or heat stroke. Work out until your breathing and your heartbeat get  faster.        Personalized Health Maintenance & Screening Recommendations  Colorectal cancer screening Shingrix vaccines  Lung Cancer Screening Recommended: no (Low Dose CT Chest recommended if Age 29-80 years, 30 pack-year currently smoking OR have quit w/in past 15 years) Hepatitis C Screening recommended: no HIV Screening recommended: no  Advanced Directives: Written information was not prepared per patient's request.  Referrals & Orders Orders Placed This Encounter  Procedures   Cologuard    Follow-up Plan Follow-up with Hali Marry, MD as planned Schedule your shingles vaccine at the pharmacy. Medicare wellness visit in one year.  Patient will access AVS on my chart.   I have personally reviewed and noted the following in the patient's chart:   Medical and social history Use of alcohol, tobacco or illicit drugs  Current medications and supplements Functional ability and status Nutritional status Physical activity Advanced directives List of other physicians Hospitalizations, surgeries, and ER visits in previous 12 months Vitals Screenings to include cognitive, depression, and falls Referrals and appointments  In addition, I have reviewed and discussed with Michaela Dodson certain preventive protocols, quality metrics, and best practice recommendations. A written personalized care plan for preventive services as well as general preventive health recommendations is available and can be mailed to the patient at her request.      Tinnie Gens, RN BSN  09/28/2022

## 2022-10-11 DIAGNOSIS — Z1211 Encounter for screening for malignant neoplasm of colon: Secondary | ICD-10-CM | POA: Diagnosis not present

## 2022-10-16 ENCOUNTER — Encounter: Payer: Self-pay | Admitting: Family Medicine

## 2022-10-18 LAB — COLOGUARD: COLOGUARD: POSITIVE — AB

## 2022-10-19 ENCOUNTER — Encounter: Payer: Self-pay | Admitting: Family Medicine

## 2022-10-19 DIAGNOSIS — R195 Other fecal abnormalities: Secondary | ICD-10-CM

## 2022-10-19 NOTE — Progress Notes (Signed)
Hi Michaela Dodson, your Cologuard was positive which means you need further work-up with a colonoscopy.  Do you have a preference for provider or location.  Usually recommend digestive health here locally.

## 2022-10-21 LAB — FECAL OCCULT BLOOD, IMMUNOCHEMICAL: IFOBT: NEGATIVE

## 2022-10-22 ENCOUNTER — Telehealth: Payer: Medicare Other | Admitting: Family Medicine

## 2022-10-30 ENCOUNTER — Encounter (INDEPENDENT_AMBULATORY_CARE_PROVIDER_SITE_OTHER): Payer: Medicare Other | Admitting: Sports Medicine

## 2022-10-30 ENCOUNTER — Telehealth: Payer: Self-pay | Admitting: Sports Medicine

## 2022-10-30 DIAGNOSIS — M170 Bilateral primary osteoarthritis of knee: Secondary | ICD-10-CM

## 2022-10-30 NOTE — Telephone Encounter (Signed)
I spent 5 total minutes of online digital evaluation and management services in this patient-initiated request for online care. 

## 2022-10-30 NOTE — Telephone Encounter (Signed)
Hi Christal, please work on Product/process development scientist, bilateral, xray confirmed OA, last done over a year ago with Synvisc.  Failed over 6 weeks MD directed conservative treatment.

## 2022-11-02 NOTE — Telephone Encounter (Signed)
[employer] paperwork faxed to [employer] at [phone] Request is for Orthovisc Pt's insurance prefers Orthovisc Fax confirmation receipt received  

## 2022-11-03 DIAGNOSIS — F3110 Bipolar disorder, current episode manic without psychotic features, unspecified: Secondary | ICD-10-CM | POA: Diagnosis not present

## 2022-11-04 NOTE — Telephone Encounter (Signed)
Benefits Investigation Details received from [employer] Injection: Synvisc  Medical: Deductible does not apply. Once the OOP has been met, pt is covered at 100%.  PA required: Yes PA form faxed to [facility] at [phone]  Fax confirmation received  Pharmacy: Product not covered under pharmacy plan.   Specialty Pharmacy: [facility]  May fill through: [facility] [place] Copay/Coinsurance: [phone] Product Copay: 20% ($280) Administration Coinsurance:  Administration Copay: $0 Deductible:  Out of Pocket Max: [id number] (met: $105)    PA submitted via fax; awaiting response.

## 2022-11-18 DIAGNOSIS — R195 Other fecal abnormalities: Secondary | ICD-10-CM | POA: Diagnosis not present

## 2022-11-18 DIAGNOSIS — Z538 Procedure and treatment not carried out for other reasons: Secondary | ICD-10-CM | POA: Diagnosis not present

## 2022-11-18 DIAGNOSIS — D123 Benign neoplasm of transverse colon: Secondary | ICD-10-CM | POA: Diagnosis not present

## 2022-11-18 DIAGNOSIS — Z1211 Encounter for screening for malignant neoplasm of colon: Secondary | ICD-10-CM | POA: Diagnosis not present

## 2022-11-18 DIAGNOSIS — Z800 Family history of malignant neoplasm of digestive organs: Secondary | ICD-10-CM | POA: Diagnosis not present

## 2022-11-24 NOTE — Telephone Encounter (Signed)
Call to [facility] to check the status of the auth.   Per Rogue Jury, no auth is needed for Synvisc.   Emailed synvisc rep to get the meds shipped to the office.   Patient aware of the above and will wait to hear that we have received the medication in the office so she can schedule her appts.

## 2022-11-25 NOTE — Telephone Encounter (Signed)
Please call pt to schedule injections for Synvisc. Once weekly for 3 weeks. Meds were rec'd today.

## 2022-12-04 ENCOUNTER — Ambulatory Visit: Payer: Medicare Other | Admitting: Sports Medicine

## 2022-12-11 ENCOUNTER — Ambulatory Visit (INDEPENDENT_AMBULATORY_CARE_PROVIDER_SITE_OTHER): Payer: Medicare Other

## 2022-12-11 ENCOUNTER — Ambulatory Visit (INDEPENDENT_AMBULATORY_CARE_PROVIDER_SITE_OTHER): Payer: Medicare Other | Admitting: Sports Medicine

## 2022-12-11 DIAGNOSIS — M170 Bilateral primary osteoarthritis of knee: Secondary | ICD-10-CM | POA: Diagnosis not present

## 2022-12-11 MED ORDER — HYLAN G-F 20 16 MG/2ML IX SOSY
16.0000 mg | PREFILLED_SYRINGE | Freq: Once | INTRA_ARTICULAR | Status: AC
  Administered 2022-12-11: 16 mg via INTRA_ARTICULAR

## 2022-12-11 NOTE — Progress Notes (Signed)
    Procedures performed today:    Procedure: Real-time Ultrasound Guided injection of the left knee Device: Samsung HS60  Verbal informed consent obtained.  Time-out conducted.  Noted no overlying erythema, induration, or other signs of local infection.  Skin prepped in a sterile fashion.  Local anesthesia: Topical Ethyl chloride.  With sterile technique and under real time ultrasound guidance:  1 syringe of Synvisc injected into the suprapatellar recess.   Completed without difficulty  Advised to call if fevers/chills, erythema, induration, drainage, or persistent bleeding.  Images permanently stored and available for review in PACS.  Impression: Technically successful ultrasound guided injection.   Procedure: Real-time Ultrasound Guided injection of the right knee Device: Samsung HS60  Verbal informed consent obtained.  Time-out conducted.  Noted no overlying erythema, induration, or other signs of local infection.  Skin prepped in a sterile fashion.  Local anesthesia: Topical Ethyl chloride.  With sterile technique and under real time ultrasound guidance:  1 syringe of Synvisc injected into the suprapatellar recess.   Completed without difficulty  Advised to call if fevers/chills, erythema, induration, drainage, or persistent bleeding.  Images permanently stored and available for review in PACS.  Impression: Technically successful ultrasound guided injection.  Independent interpretation of notes and tests performed by another provider:   None.  Brief History, Exam, Impression, and Recommendations:    Primary osteoarthritis of both knees Ellawyn responded really well to a series of Synvisc approximately 2 years ago. Now having recurrence of pain, we are starting repeat Synvisc areas, Synvisc No. 1 of 3 both knees, return in 1 week for #2 of 3.    ____________________________________________ Gwen Her. Dianah Field, M.D., ABFM., CAQSM., AME. Primary Care and Sports  Medicine [facility]  Adjunct Professor of [place] of [facility] of Medicine  Risk manager

## 2022-12-11 NOTE — Assessment & Plan Note (Signed)
Marthe responded really well to a series of Synvisc approximately 2 years ago. Now having recurrence of pain, we are starting repeat Synvisc areas, Synvisc No. 1 of 3 both knees, return in 1 week for #2 of 3.

## 2022-12-18 ENCOUNTER — Ambulatory Visit (INDEPENDENT_AMBULATORY_CARE_PROVIDER_SITE_OTHER): Payer: Medicare Other | Admitting: Sports Medicine

## 2022-12-18 ENCOUNTER — Ambulatory Visit (INDEPENDENT_AMBULATORY_CARE_PROVIDER_SITE_OTHER): Payer: Medicare Other

## 2022-12-18 DIAGNOSIS — M47816 Spondylosis without myelopathy or radiculopathy, lumbar region: Secondary | ICD-10-CM | POA: Diagnosis not present

## 2022-12-18 DIAGNOSIS — M170 Bilateral primary osteoarthritis of knee: Secondary | ICD-10-CM

## 2022-12-18 MED ORDER — HYLAN G-F 20 16 MG/2ML IX SOSY
16.0000 mg | PREFILLED_SYRINGE | Freq: Once | INTRA_ARTICULAR | Status: AC
  Administered 2022-12-18: 16 mg via INTRA_ARTICULAR

## 2022-12-18 MED ORDER — PREDNISONE 50 MG PO TABS: 50.0000 mg | ORAL_TABLET | Freq: Every day | ORAL | 0 refills | Status: DC

## 2022-12-18 NOTE — Progress Notes (Signed)
    Procedures performed today:    Procedure: Real-time Ultrasound Guided injection of the left knee Device: Samsung HS60  Verbal informed consent obtained.  Time-out conducted.  Noted no overlying erythema, induration, or other signs of local infection.  Skin prepped in a sterile fashion.  Local anesthesia: Topical Ethyl chloride.  With sterile technique and under real time ultrasound guidance:  1 syringe of Synvisc injected into the suprapatellar recess.   Completed without difficulty  Advised to call if fevers/chills, erythema, induration, drainage, or persistent bleeding.  Images permanently stored and available for review in PACS.  Impression: Technically successful ultrasound guided injection.   Procedure: Real-time Ultrasound Guided injection of the right knee Device: Samsung HS60  Verbal informed consent obtained.  Time-out conducted.  Noted no overlying erythema, induration, or other signs of local infection.  Skin prepped in a sterile fashion.  Local anesthesia: Topical Ethyl chloride.  With sterile technique and under real time ultrasound guidance:  1 syringe of Synvisc injected into the suprapatellar recess.   Completed without difficulty  Advised to call if fevers/chills, erythema, induration, drainage, or persistent bleeding.  Images permanently stored and available for review in PACS.  Impression: Technically successful ultrasound guided injection.  Independent interpretation of notes and tests performed by another provider:   None.  Brief History, Exam, Impression, and Recommendations:    Primary osteoarthritis of both knees Michaela Dodson did really well to a series of Synvisc approximately 2 years ago, we are repeating the series, today we did Synvisc No. 2 of 3 both knees, return in 1 week for #3 of 3.  Lumbar spondylosis Also having increasing left-sided low back pain localized to the quadratus lumborum, she does have L3-L5 facet arthritis, she had a  radiofrequency ablation back in 2022 and pain got a lot better. Now having recurrence of the pain, adding prednisone, lumbar spine conditioning, if worsening of discomfort we will set her back up with L3-L5 facet RFA.  Chronic process with exacerbation and pharmacologic intervention  ____________________________________________ Gwen Her. Dianah Field, M.D., ABFM., CAQSM., AME. Primary Care and Sports Medicine [facility]  Adjunct Professor of [place] of [facility] of Medicine  Risk manager

## 2022-12-18 NOTE — Assessment & Plan Note (Signed)
Shivaun did really well to a series of Synvisc approximately 2 years ago, we are repeating the series, today we did Synvisc No. 2 of 3 both knees, return in 1 week for #3 of 3.

## 2022-12-18 NOTE — Assessment & Plan Note (Signed)
Also having increasing left-sided low back pain localized to the quadratus lumborum, she does have L3-L5 facet arthritis, she had a radiofrequency ablation back in 2022 and pain got a lot better. Now having recurrence of the pain, adding prednisone, lumbar spine conditioning, if worsening of discomfort we will set her back up with L3-L5 facet RFA.

## 2022-12-24 ENCOUNTER — Encounter: Payer: Self-pay | Admitting: Sports Medicine

## 2022-12-24 DIAGNOSIS — M47816 Spondylosis without myelopathy or radiculopathy, lumbar region: Secondary | ICD-10-CM

## 2022-12-25 ENCOUNTER — Ambulatory Visit (INDEPENDENT_AMBULATORY_CARE_PROVIDER_SITE_OTHER): Payer: Medicare Other | Admitting: Sports Medicine

## 2022-12-25 ENCOUNTER — Ambulatory Visit (INDEPENDENT_AMBULATORY_CARE_PROVIDER_SITE_OTHER): Payer: Medicare Other

## 2022-12-25 DIAGNOSIS — M170 Bilateral primary osteoarthritis of knee: Secondary | ICD-10-CM | POA: Diagnosis not present

## 2022-12-25 MED ORDER — HYLAN G-F 20 16 MG/2ML IX SOSY
16.0000 mg | PREFILLED_SYRINGE | Freq: Once | INTRA_ARTICULAR | Status: AC
  Administered 2022-12-25: 16 mg via INTRA_ARTICULAR

## 2022-12-25 NOTE — Assessment & Plan Note (Signed)
Synvisc 3 of 3 both knees, return as needed

## 2022-12-25 NOTE — Progress Notes (Signed)
    Procedures performed today:    Procedure: Real-time Ultrasound Guided injection of the left knee Device: Samsung HS60  Verbal informed consent obtained.  Time-out conducted.  Noted no overlying erythema, induration, or other signs of local infection.  Skin prepped in a sterile fashion.  Local anesthesia: Topical Ethyl chloride.  With sterile technique and under real time ultrasound guidance:  1 syringe of Synvisc injected into the suprapatellar recess.   Completed without difficulty  Advised to call if fevers/chills, erythema, induration, drainage, or persistent bleeding.  Images permanently stored and available for review in PACS.  Impression: Technically successful ultrasound guided injection.   Procedure: Real-time Ultrasound Guided injection of the right knee Device: Samsung HS60  Verbal informed consent obtained.  Time-out conducted.  Noted no overlying erythema, induration, or other signs of local infection.  Skin prepped in a sterile fashion.  Local anesthesia: Topical Ethyl chloride.  With sterile technique and under real time ultrasound guidance:  1 syringe of Synvisc injected into the suprapatellar recess.   Completed without difficulty  Advised to call if fevers/chills, erythema, induration, drainage, or persistent bleeding.  Images permanently stored and available for review in PACS.  Impression: Technically successful ultrasound guided injection.  Independent interpretation of notes and tests performed by another provider:   None.  Brief History, Exam, Impression, and Recommendations:    Primary osteoarthritis of both knees Synvisc 3 of 3 both knees, return as needed    ____________________________________________ Gwen Her. Dianah Field, M.D., ABFM., CAQSM., AME. Primary Care and Sports Medicine [facility]  Adjunct Professor of [place] of [facility] of Medicine  Risk manager

## 2022-12-30 DIAGNOSIS — M47816 Spondylosis without myelopathy or radiculopathy, lumbar region: Secondary | ICD-10-CM | POA: Diagnosis not present

## 2022-12-30 DIAGNOSIS — M5459 Other low back pain: Secondary | ICD-10-CM | POA: Diagnosis not present

## 2023-01-26 DIAGNOSIS — F3110 Bipolar disorder, current episode manic without psychotic features, unspecified: Secondary | ICD-10-CM | POA: Diagnosis not present

## 2023-01-28 DIAGNOSIS — K080 Exfoliation of teeth due to systemic causes: Secondary | ICD-10-CM | POA: Diagnosis not present

## 2023-02-08 DIAGNOSIS — M47816 Spondylosis without myelopathy or radiculopathy, lumbar region: Secondary | ICD-10-CM | POA: Diagnosis not present

## 2023-02-12 ENCOUNTER — Telehealth: Payer: Self-pay | Admitting: *Deleted

## 2023-02-12 MED ORDER — ESOMEPRAZOLE MAGNESIUM 20 MG PO CPDR: 20.0000 mg | DELAYED_RELEASE_CAPSULE | Freq: Every morning | ORAL | 1 refills | Status: DC

## 2023-02-12 NOTE — Telephone Encounter (Signed)
Meds ordered this encounter  Medications   esomeprazole (NEXIUM) 20 MG capsule    Sig: Take 1 capsule (20 mg total) by mouth in the morning.    Dispense:  90 capsule    Refill:  1

## 2023-02-12 NOTE — Telephone Encounter (Signed)
Pt wanted to know if Dr. Madilyn Fireman would send in Nexium to [facility] or does she need an appointment for her to prescribed this? Pt has Hx of acid reflux  Pt was advised that she could by this OTC and it would be cheaper than going thru her insurance.   Will send to pcp.

## 2023-02-15 NOTE — Telephone Encounter (Signed)
Message sent to patient via Mychart.

## 2023-02-24 DIAGNOSIS — Z1211 Encounter for screening for malignant neoplasm of colon: Secondary | ICD-10-CM | POA: Diagnosis not present

## 2023-02-24 DIAGNOSIS — Z800 Family history of malignant neoplasm of digestive organs: Secondary | ICD-10-CM | POA: Diagnosis not present

## 2023-02-24 DIAGNOSIS — R195 Other fecal abnormalities: Secondary | ICD-10-CM | POA: Diagnosis not present

## 2023-03-08 DIAGNOSIS — Z79899 Other long term (current) drug therapy: Secondary | ICD-10-CM | POA: Diagnosis not present

## 2023-03-08 DIAGNOSIS — M47816 Spondylosis without myelopathy or radiculopathy, lumbar region: Secondary | ICD-10-CM | POA: Diagnosis not present

## 2023-03-08 DIAGNOSIS — J45909 Unspecified asthma, uncomplicated: Secondary | ICD-10-CM | POA: Diagnosis not present

## 2023-03-08 DIAGNOSIS — M5459 Other low back pain: Secondary | ICD-10-CM | POA: Diagnosis not present

## 2023-03-08 DIAGNOSIS — F419 Anxiety disorder, unspecified: Secondary | ICD-10-CM | POA: Diagnosis not present

## 2023-03-16 ENCOUNTER — Other Ambulatory Visit (HOSPITAL_COMMUNITY)
Admission: RE | Admit: 2023-03-16 | Discharge: 2023-03-16 | Disposition: A | Discharge status: Home / Self Care | Payer: Medicare Other | Source: Ambulatory Visit | Attending: Family Medicine | Admitting: Family Medicine

## 2023-03-16 ENCOUNTER — Ambulatory Visit (INDEPENDENT_AMBULATORY_CARE_PROVIDER_SITE_OTHER): Payer: Medicare Other | Admitting: Family Medicine

## 2023-03-16 ENCOUNTER — Encounter: Payer: Self-pay | Admitting: Family Medicine

## 2023-03-16 VITALS — BP 133/73 | HR 111 | Wt 203.0 lb

## 2023-03-16 DIAGNOSIS — Z1151 Encounter for screening for human papillomavirus (HPV): Secondary | ICD-10-CM | POA: Diagnosis not present

## 2023-03-16 DIAGNOSIS — R319 Hematuria, unspecified: Secondary | ICD-10-CM | POA: Diagnosis not present

## 2023-03-16 DIAGNOSIS — Z01419 Encounter for gynecological examination (general) (routine) without abnormal findings: Secondary | ICD-10-CM | POA: Diagnosis not present

## 2023-03-16 DIAGNOSIS — Z124 Encounter for screening for malignant neoplasm of cervix: Secondary | ICD-10-CM | POA: Diagnosis not present

## 2023-03-16 DIAGNOSIS — R82998 Other abnormal findings in urine: Secondary | ICD-10-CM

## 2023-03-16 DIAGNOSIS — N920 Excessive and frequent menstruation with regular cycle: Secondary | ICD-10-CM

## 2023-03-16 LAB — POCT URINALYSIS DIP (CLINITEK)
Bilirubin, UA: NEGATIVE
Glucose, UA: NEGATIVE mg/dL
Ketones, POC UA: NEGATIVE mg/dL
Leukocytes, UA: NEGATIVE
Nitrite, UA: NEGATIVE
POC PROTEIN,UA: NEGATIVE
Spec Grav, UA: 1.015 (ref 1.010–1.025)
Urobilinogen, UA: 0.2 E.U./dL
pH, UA: 7 (ref 5.0–8.0)

## 2023-03-16 NOTE — Progress Notes (Signed)
   Established Patient Office Visit  Subjective   Patient ID: Michaela Dodson, female    DOB: 11-27-1971  Age: 52 y.o. MRN: [id number]  Chief Complaint  Patient presents with   Gynecologic Exam    HPI  Here for pap smear today.  Last one was about 4.5 years ago.  She had an endometrial ablation done well over a decade ago but more recently has started having some spotting again.  She is not had any other perimenopausal postmenopausal symptoms such as hot flashes. No new sex partners.     ROS    Objective:     BP 133/73   Pulse (!) 111   Wt 203 lb (92.1 kg)   SpO2 99%   BMI 32.77 kg/m    Physical Exam Exam conducted with a chaperone present.  Genitourinary:    General: Normal vulva.     Labia:        Right: No rash.        Left: No rash.      Vagina: Normal.     Cervix: Normal.     Uterus: Normal.      Adnexa: Right adnexa normal and left adnexa normal.     Rectum: Normal.      Results for orders placed or performed in visit on 03/16/23  POCT URINALYSIS DIP (CLINITEK)  Result Value Ref Range   Color, UA yellow yellow   Clarity, UA clear clear   Glucose, UA negative negative mg/dL   Bilirubin, UA negative negative   Ketones, POC UA negative negative mg/dL   Spec Grav, UA 1.015 1.010 - 1.025   Blood, UA small (A) negative   pH, UA 7.0 5.0 - 8.0   POC PROTEIN,UA negative negative, trace   Urobilinogen, UA 0.2 0.2 or 1.0 E.U./dL   Nitrite, UA Negative Negative   Leukocytes, UA Negative Negative      The 10-year ASCVD risk score (Arnett DK, et al., 2019) is: 1.2%    Assessment & Plan:   Problem List Items Addressed This Visit   None Visit Diagnoses     Screening for cervical cancer    -  Primary   Relevant Orders   Cytology - PAP   Spotting       Relevant Orders   Progesterone   Estradiol   Follicle stimulating hormone   Luteinizing hormone   Dark urine       Relevant Orders   POCT URINALYSIS DIP (CLINITEK) (Completed)   Hematuria,  unspecified type       Relevant Orders   Urinalysis, microscopic only      Spotting-unclear if it is just spotting years after her endometrial ablation or if she may actually be having postmenopausal spotting since she is 52.  So we will check some hormone level as well.  We did go ahead and do her Pap smear today as well as STD testing sure thing looks clear.  Will call with results once available.  Urine-urinalysis did show some trace blood so we will send for microscopic review.  No sign of infection.  No follow-ups on file.    [name], MD

## 2023-03-17 ENCOUNTER — Other Ambulatory Visit: Payer: Self-pay | Admitting: *Deleted

## 2023-03-17 DIAGNOSIS — R3129 Other microscopic hematuria: Secondary | ICD-10-CM

## 2023-03-17 LAB — URINALYSIS, MICROSCOPIC ONLY
Hyaline Cast: NONE SEEN /LPF
RBC / HPF: NONE SEEN /HPF (ref 0–2)
Squamous Epithelial / HPF: NONE SEEN /HPF (ref <=5)

## 2023-03-17 LAB — PROGESTERONE: Progesterone: 0.7 ng/mL

## 2023-03-17 LAB — FOLLICLE STIMULATING HORMONE: FSH: 17.7 m[IU]/mL

## 2023-03-17 LAB — LUTEINIZING HORMONE: LH: 12.1 m[IU]/mL

## 2023-03-17 LAB — ESTRADIOL: Estradiol: 57 pg/mL

## 2023-03-17 NOTE — Progress Notes (Signed)
Hi Tanasha, no whole red blood cells in the urine which is reassuring.  They had picked up on some of the dipstick yesterday but again no whole red blood cells on the microscopic review.  They said they did see a few bacteria now these could just be skin bacteria so organ to call and see if they might be able to do a culture.  You do not look like you are in menopause yet.  So that might explain the spotting.  If the bleeding becomes more heavy and [facility] happy to refer you back to GYN.

## 2023-03-17 NOTE — Progress Notes (Signed)
Ok , can you see if she is willing to run by and see if she can do sample for culture.

## 2023-03-18 LAB — CYTOLOGY - PAP
Chlamydia: NEGATIVE
Comment: NEGATIVE
Comment: NEGATIVE
Comment: NORMAL
Diagnosis: NEGATIVE
High risk HPV: NEGATIVE
Neisseria Gonorrhea: NEGATIVE

## 2023-03-19 DIAGNOSIS — R3129 Other microscopic hematuria: Secondary | ICD-10-CM | POA: Diagnosis not present

## 2023-03-19 NOTE — Progress Notes (Signed)
[name], Pap smear looks great no worrisome findings everything is normal negative for HPV and negative for STDs.  Plan to repeat Pap smear in 5 years.  In regards to the spotting I would just see how you do over the next couple of months if it continues to be persistent then we can always get you in with a GYN to discuss treatment but it looks like you are not postmenopausal yet so you can still spot bleed here and there.  As long as it is not heavy or it is not constant then we can continue to keep an eye on it.

## 2023-03-22 DIAGNOSIS — H5213 Myopia, bilateral: Secondary | ICD-10-CM | POA: Diagnosis not present

## 2023-03-22 LAB — URINE CULTURE
MICRO NUMBER:: 14730183
SPECIMEN QUALITY:: ADEQUATE

## 2023-03-22 NOTE — Progress Notes (Signed)
Hi Michaela Dodson UTI shows just a little bit of bacteria not enough that we would normally treat with an antibiotic.  Are you feeling any better or are you still having symptoms?

## 2023-03-26 ENCOUNTER — Encounter: Payer: Self-pay | Admitting: Sports Medicine

## 2023-04-05 ENCOUNTER — Other Ambulatory Visit (INDEPENDENT_AMBULATORY_CARE_PROVIDER_SITE_OTHER): Payer: Medicare Other

## 2023-04-05 ENCOUNTER — Ambulatory Visit (INDEPENDENT_AMBULATORY_CARE_PROVIDER_SITE_OTHER): Payer: Medicare Other | Admitting: Sports Medicine

## 2023-04-05 DIAGNOSIS — M170 Bilateral primary osteoarthritis of knee: Secondary | ICD-10-CM | POA: Diagnosis not present

## 2023-04-05 NOTE — Assessment & Plan Note (Signed)
We finished a series of Synvisc in December of last year, increasing pain right knee, adding a steroid injection for now until we can restart Synvisc.

## 2023-04-05 NOTE — Progress Notes (Signed)
    Procedures performed today:    Procedure: Real-time Ultrasound Guided injection of the right knee Device: Samsung HS60  Verbal informed consent obtained.  Time-out conducted.  Noted no overlying erythema, induration, or other signs of local infection.  Skin prepped in a sterile fashion.  Local anesthesia: Topical Ethyl chloride.  With sterile technique and under real time ultrasound guidance: Mild effusion noted, 1 cc Kenalog 40, 2 cc lidocaine, 2 cc bupivacaine injected easily Completed without difficulty  Advised to call if fevers/chills, erythema, induration, drainage, or persistent bleeding.  Images permanently stored and available for review in PACS.  Impression: Technically successful ultrasound guided injection.  Independent interpretation of notes and tests performed by another provider:   None.  Brief History, Exam, Impression, and Recommendations:    Primary osteoarthritis of both knees We finished a series of Synvisc in December of last year, increasing pain right knee, adding a steroid injection for now until we can restart Synvisc.    ____________________________________________ Ihor Austin. Benjamin Stain, M.D., ABFM., CAQSM., AME. Primary Care and Sports Medicine [facility]  Adjunct Professor of Family Medicine  [place] [employer] [facility] [employer]  Restaurant manager, fast food

## 2023-04-20 DIAGNOSIS — F3110 Bipolar disorder, current episode manic without psychotic features, unspecified: Secondary | ICD-10-CM | POA: Diagnosis not present

## 2023-04-21 DIAGNOSIS — M47816 Spondylosis without myelopathy or radiculopathy, lumbar region: Secondary | ICD-10-CM | POA: Diagnosis not present

## 2023-04-21 DIAGNOSIS — M5459 Other low back pain: Secondary | ICD-10-CM | POA: Diagnosis not present

## 2023-05-21 ENCOUNTER — Encounter: Payer: Self-pay | Admitting: Sports Medicine

## 2023-06-11 DIAGNOSIS — N3946 Mixed incontinence: Secondary | ICD-10-CM | POA: Diagnosis not present

## 2023-06-17 ENCOUNTER — Encounter: Payer: Self-pay | Admitting: Family Medicine

## 2023-07-05 DIAGNOSIS — H40013 Open angle with borderline findings, low risk, bilateral: Secondary | ICD-10-CM | POA: Diagnosis not present

## 2023-07-12 NOTE — Progress Notes (Signed)
Stool is negative for blood which is great.

## 2023-07-16 ENCOUNTER — Encounter (INDEPENDENT_AMBULATORY_CARE_PROVIDER_SITE_OTHER): Payer: Medicare Other | Admitting: Sports Medicine

## 2023-07-16 ENCOUNTER — Ambulatory Visit (INDEPENDENT_AMBULATORY_CARE_PROVIDER_SITE_OTHER): Payer: Medicare Other

## 2023-07-16 DIAGNOSIS — M170 Bilateral primary osteoarthritis of knee: Secondary | ICD-10-CM

## 2023-07-16 DIAGNOSIS — M25561 Pain in right knee: Secondary | ICD-10-CM

## 2023-07-16 DIAGNOSIS — M25562 Pain in left knee: Secondary | ICD-10-CM | POA: Diagnosis not present

## 2023-07-16 DIAGNOSIS — G8929 Other chronic pain: Secondary | ICD-10-CM | POA: Diagnosis not present

## 2023-07-16 NOTE — Telephone Encounter (Signed)
Please see the MyChart message reply(ies) for my assessment and plan.    This patient gave consent for this Medical Advice Message and is aware that it may result in a bill to their insurance company, as well as the possibility of receiving a bill for a co-payment or deductible. They are an established patient, but are not seeking medical advice exclusively about a problem treated during an in person or video visit in the last seven days. I did not recommend an in person or video visit within seven days of my reply.    I spent a total of 5 minutes cumulative time within 7 days through MyChart messaging.  Thomas Thekkekandam, MD   

## 2023-07-16 NOTE — Addendum Note (Signed)
Addended by: Monica Becton on: 07/16/2023 10:21 AM   Modules accepted: Orders

## 2023-08-03 DIAGNOSIS — K080 Exfoliation of teeth due to systemic causes: Secondary | ICD-10-CM | POA: Diagnosis not present

## 2023-08-06 ENCOUNTER — Other Ambulatory Visit: Payer: Self-pay | Admitting: Family Medicine

## 2023-08-13 DIAGNOSIS — K912 Postsurgical malabsorption, not elsewhere classified: Secondary | ICD-10-CM | POA: Diagnosis not present

## 2023-08-13 DIAGNOSIS — Z903 Acquired absence of stomach [part of]: Secondary | ICD-10-CM | POA: Diagnosis not present

## 2023-08-16 DIAGNOSIS — F3110 Bipolar disorder, current episode manic without psychotic features, unspecified: Secondary | ICD-10-CM | POA: Diagnosis not present

## 2023-08-31 ENCOUNTER — Other Ambulatory Visit: Payer: Self-pay | Admitting: Sports Medicine

## 2023-08-31 DIAGNOSIS — Z903 Acquired absence of stomach [part of]: Secondary | ICD-10-CM | POA: Diagnosis not present

## 2023-08-31 DIAGNOSIS — E7800 Pure hypercholesterolemia, unspecified: Secondary | ICD-10-CM | POA: Diagnosis not present

## 2023-08-31 DIAGNOSIS — F3132 Bipolar disorder, current episode depressed, moderate: Secondary | ICD-10-CM

## 2023-08-31 DIAGNOSIS — K219 Gastro-esophageal reflux disease without esophagitis: Secondary | ICD-10-CM | POA: Diagnosis not present

## 2023-08-31 DIAGNOSIS — K912 Postsurgical malabsorption, not elsewhere classified: Secondary | ICD-10-CM | POA: Diagnosis not present

## 2023-09-02 DIAGNOSIS — M47816 Spondylosis without myelopathy or radiculopathy, lumbar region: Secondary | ICD-10-CM | POA: Diagnosis not present

## 2023-09-02 DIAGNOSIS — M5459 Other low back pain: Secondary | ICD-10-CM | POA: Diagnosis not present

## 2023-09-02 DIAGNOSIS — M7918 Myalgia, other site: Secondary | ICD-10-CM | POA: Diagnosis not present

## 2023-09-02 DIAGNOSIS — G894 Chronic pain syndrome: Secondary | ICD-10-CM | POA: Diagnosis not present

## 2023-10-04 ENCOUNTER — Ambulatory Visit: Payer: Medicare Other | Admitting: Family Medicine

## 2023-10-04 DIAGNOSIS — Z0000 Encounter for general adult medical examination without abnormal findings: Secondary | ICD-10-CM | POA: Diagnosis not present

## 2023-10-04 NOTE — Patient Instructions (Addendum)
MEDICARE ANNUAL WELLNESS VISIT Health Maintenance Summary and Written Plan of Care  Michaela Dodson ,  Thank you for allowing me to perform your Medicare Annual Wellness Visit and for your ongoing commitment to your health.   Health Maintenance & Immunization History Health Maintenance  Topic Date Due  . COVID-19 Vaccine (1 - 2023-24 season) 10/20/2023 (Originally 08/29/2023)  . Zoster Vaccines- Shingrix (1 of 2) 01/04/2024 (Originally 01/26/2021)  . INFLUENZA VACCINE  03/27/2024 (Originally 07/29/2023)  . MAMMOGRAM  09/21/2024  . Medicare Annual Wellness (AWV)  10/03/2024  . Cervical Cancer Screening (HPV/Pap Cotest)  03/15/2028  . DTaP/Tdap/Td (3 - Td or Tdap) 09/17/2029  . Colonoscopy  02/24/2033  . Hepatitis C Screening  Completed  . HIV Screening  Completed  . Pneumococcal Vaccine 22-5 Years old  Aged Out  . HPV VACCINES  Aged Out   Immunization History  Administered Date(s) Administered  . Influenza Whole 09/24/2008  . Influenza-Unspecified 09/08/2017  . Pneumococcal Polysaccharide-23 04/04/2012  . Td 12/28/2005  . Tdap 09/18/2019    These are the patient goals that we discussed:  Goals Addressed              This Visit's Progress   .  Patient Stated (pt-stated)        10/04/2023 AWV Goal: Exercise for General Health  Patient will verbalize understanding of the benefits of increased physical activity: Exercising regularly is important. It will improve your overall fitness, flexibility, and endurance. Regular exercise also will improve your overall health. It can help you control your weight, reduce stress, and improve your bone density. Over the next year, patient will increase physical activity as tolerated with a goal of at least 150 minutes of moderate physical activity per week.  You can tell that you are exercising at a moderate intensity if your heart starts beating faster and you start breathing faster but can still hold a conversation. Moderate-intensity exercise  ideas include: Walking 1 mile (1.6 km) in about 15 minutes Biking Hiking Golfing Dancing Water aerobics Patient will verbalize understanding of everyday activities that increase physical activity by providing examples like the following: Yard work, such as: Insurance underwriter Gardening Washing windows or floors Patient will be able to explain general safety guidelines for exercising:  Before you start a new exercise program, talk with your health care provider. Do not exercise so much that you hurt yourself, feel dizzy, or get very short of breath. Wear comfortable clothes and wear shoes with good support. Drink plenty of water while you exercise to prevent dehydration or heat stroke. Work out until your breathing and your heartbeat get faster.         This is a list of Health Maintenance Items that are overdue or due now: Influenza vaccine Shingles vaccine Mammogram- scheduled at the end of the month.  Declined influenza and shingles vaccine.   Orders/Referrals Placed Today: No orders of the defined types were placed in this encounter.  (Contact our referral department at [phone] if you have not spoken with someone about your referral appointment within the next 5 days)    Follow-up Plan Follow-up with Agapito Games, MD as planned Medicare wellness visit in one year.  Patient will access AVS on my chart.      Health Maintenance, Female Adopting a healthy lifestyle and getting preventive care are important in promoting health and wellness. Ask your health care provider about: The  right schedule for you to have regular tests and exams. Things you can do on your own to prevent diseases and keep yourself healthy. What should I know about diet, weight, and exercise? Eat a healthy diet  Eat a diet that includes plenty of vegetables, fruits, low-fat dairy products, and lean  protein. Do not eat a lot of foods that are high in solid fats, added sugars, or sodium. Maintain a healthy weight Body mass index (BMI) is used to identify weight problems. It estimates body fat based on height and weight. Your health care provider can help determine your BMI and help you achieve or maintain a healthy weight. Get regular exercise Get regular exercise. This is one of the most important things you can do for your health. Most adults should: Exercise for at least 150 minutes each week. The exercise should increase your heart rate and make you sweat (moderate-intensity exercise). Do strengthening exercises at least twice a week. This is in addition to the moderate-intensity exercise. Spend less time sitting. Even light physical activity can be beneficial. Watch cholesterol and blood lipids Have your blood tested for lipids and cholesterol at 52 years of age, then have this test every 5 years. Have your cholesterol levels checked more often if: Your lipid or cholesterol levels are high. You are older than 52 years of age. You are at high risk for heart disease. What should I know about cancer screening? Depending on your health history and family history, you may need to have cancer screening at various ages. This may include screening for: Breast cancer. Cervical cancer. Colorectal cancer. Skin cancer. Lung cancer. What should I know about heart disease, diabetes, and high blood pressure? Blood pressure and heart disease High blood pressure causes heart disease and increases the risk of stroke. This is more likely to develop in people who have high blood pressure readings or are overweight. Have your blood pressure checked: Every 3-5 years if you are 63-71 years of age. Every year if you are 41 years old or older. Diabetes Have regular diabetes screenings. This checks your fasting blood sugar level. Have the screening done: Once every three years after age 72 if you are at  a normal weight and have a low risk for diabetes. More often and at a younger age if you are overweight or have a high risk for diabetes. What should I know about preventing infection? Hepatitis B If you have a higher risk for hepatitis B, you should be screened for this virus. Talk with your health care provider to find out if you are at risk for hepatitis B infection. Hepatitis C Testing is recommended for: Everyone born from [birth year] through 1965. Anyone with known risk factors for hepatitis C. Sexually transmitted infections (STIs) Get screened for STIs, including gonorrhea and chlamydia, if: You are sexually active and are younger than 52 years of age. You are older than 52 years of age and your health care provider tells you that you are at risk for this type of infection. Your sexual activity has changed since you were last screened, and you are at increased risk for chlamydia or gonorrhea. Ask your health care provider if you are at risk. Ask your health care provider about whether you are at high risk for HIV. Your health care provider may recommend a prescription medicine to help prevent HIV infection. If you choose to take medicine to prevent HIV, you should first get tested for HIV. You should then be tested every 3  months for as long as you are taking the medicine. Pregnancy If you are about to stop having your period (premenopausal) and you may become pregnant, seek counseling before you get pregnant. Take 400 to 800 micrograms (mcg) of folic acid every day if you become pregnant. Ask for birth control (contraception) if you want to prevent pregnancy. Osteoporosis and menopause Osteoporosis is a disease in which the bones lose minerals and strength with aging. This can result in bone fractures. If you are 39 years old or older, or if you are at risk for osteoporosis and fractures, ask your health care provider if you should: Be screened for bone loss. Take a calcium or vitamin D  supplement to lower your risk of fractures. Be given hormone replacement therapy (HRT) to treat symptoms of menopause. Follow these instructions at home: Alcohol use Do not drink alcohol if: Your health care provider tells you not to drink. You are pregnant, may be pregnant, or are planning to become pregnant. If you drink alcohol: Limit how much you have to: 0-1 drink a day. Know how much alcohol is in your drink. In the [place] one drink equals one 12 oz bottle of beer (355 mL), one 5 oz glass of wine (148 mL), or one 1 oz glass of hard liquor (44 mL). Lifestyle Do not use any products that contain nicotine or tobacco. These products include cigarettes, chewing tobacco, and vaping devices, such as e-cigarettes. If you need help quitting, ask your health care provider. Do not use street drugs. Do not share needles. Ask your health care provider for help if you need support or information about quitting drugs. General instructions Schedule regular health, dental, and eye exams. Stay current with your vaccines. Tell your health care provider if: You often feel depressed. You have ever been abused or do not feel safe at home. Summary Adopting a healthy lifestyle and getting preventive care are important in promoting health and wellness. Follow your health care provider's instructions about healthy diet, exercising, and getting tested or screened for diseases. Follow your health care provider's instructions on monitoring your cholesterol and blood pressure. This information is not intended to replace advice given to you by your health care provider. Make sure you discuss any questions you have with your health care provider. Document Revised: 05/05/2021 Document Reviewed: 05/05/2021 Elsevier Patient Education  2024 [employer].

## 2023-10-04 NOTE — Progress Notes (Signed)
MEDICARE ANNUAL WELLNESS VISIT  10/04/2023  Telephone Visit Disclaimer This Medicare AWV was conducted by telephone due to national recommendations for restrictions regarding the COVID-19 Pandemic (e.g. social distancing).  I verified, using two identifiers, that I am speaking with Michaela Dodson or their authorized healthcare agent. I discussed the limitations, risks, security, and privacy concerns of performing an evaluation and management service by telephone and the potential availability of an in-person appointment in the future. The patient expressed understanding and agreed to proceed.  Location of Patient: Home Location of Provider (nurse):  In the office.  Subjective:    Michaela Dodson is a 52 y.o. female patient of Metheney, Barbarann Ehlers, MD who had a Medicare Annual Wellness Visit today via telephone. Janat is Disabled and lives with their family. she has 3 children. she reports that she is socially active and does interact with friends/family regularly. she is moderately physically active and enjoys spending time with grandchildren.  Patient Care Team: Agapito Games, MD as PCP - General (Family Medicine) Livia Snellen., MD as Referring Physician (Surgery)     10/04/2023    8:09 AM 09/28/2022    8:06 AM 11/26/2021    8:33 AM 09/20/2019    8:49 AM  Advanced Directives  Does Patient Have a Medical Advance Directive? Yes Yes Yes No  Type of Advance Directive Living will Living will    Does patient want to make changes to medical advance directive? No - Patient declined No - Patient declined    Would patient like information on creating a medical advance directive?    No - Patient declined    Hospital Utilization Over the Past 12 Months: # of hospitalizations or ER visits: 0 # of surgeries: 0  Review of Systems    Patient reports that her overall health is unchanged compared to last year.  History obtained from chart review and the patient  Patient  Reported Readings (BP, Pulse, CBG, Weight, etc) none Per patient no change in vitals since last visit, unable to obtain new vitals due to telehealth visit  Pain Assessment Pain : No/denies pain     Current Medications & Allergies (verified) Allergies as of 10/04/2023   No Known Allergies      Medication List        Accurate as of October 04, 2023  8:23 AM. If you have any questions, ask your nurse or doctor.          STOP taking these medications    Adderall XR 30 MG 24 hr capsule Generic drug: amphetamine-dextroamphetamine   B-12 PO   predniSONE 50 MG tablet Commonly known as: DELTASONE   propranolol 10 MG tablet Commonly known as: INDERAL       TAKE these medications    buPROPion 150 MG 24 hr tablet Commonly known as: WELLBUTRIN XL Take 150 mg by mouth every morning.   DULoxetine 60 MG capsule Commonly known as: CYMBALTA Take 1 capsule by mouth once daily   esomeprazole 20 MG capsule Commonly known as: NEXIUM Take 1 capsule by mouth in the morning   phentermine 37.5 MG tablet Commonly known as: ADIPEX-P Take 37.5 mg by mouth every morning.   risperiDONE 3 MG tablet Commonly known as: RISPERDAL Take 1.5 tablets by mouth daily. 1.5 mg daily   trospium 20 MG tablet Commonly known as: SANCTURA Take 20 mg by mouth daily.   Vitamin D (Ergocalciferol) 1.25 MG (50000 UNIT) Caps capsule Commonly known as: DRISDOL Take  by mouth once a week.        History (reviewed): Past Medical History:  Diagnosis Date   Allergy    Depression    GERD (gastroesophageal reflux disease)    Pt states that this has gone away since her recent weight loss   H/O thromboembolism    superficial-left medial thigh lipoma, vericose veins   Obesity    Pt has lost the weight and is no longer obese   Past Surgical History:  Procedure Laterality Date   BREAST BIOPSY     CARPAL TUNNEL RELEASE Right 09/2012   Dr. Mikle Bosworth RELEASE Left 11/2013   Dr.  Orlan Leavens   TUBAL LIGATION     uterine ablation     Family History  Problem Relation Age of Onset   Breast cancer Mother    Bladder Cancer Mother    Colon cancer Maternal Grandmother    Social History   Socioeconomic History   Marital status: Married    Spouse name: Michaela Dodson   Number of children: 3   Years of education: 12   Highest education level: 12th grade  Occupational History   Occupation: Disbability  Tobacco Use   Smoking status: Former    Current packs/day: 0.00    Types: Cigarettes    Quit date: 07/18/2015    Years since quitting: 8.2   Smokeless tobacco: Never   Tobacco comments:    e-cigs  Vaping Use   Vaping status: Every Day   Substances: Nicotine  Substance and Sexual Activity   Alcohol use: Not Currently   Drug use: No   Sexual activity: Yes    Birth control/protection: None  Other Topics Concern   Not on file  Social History Narrative   Lives with her husband and her son. She enjoys spending time with grandchildren.   Social Determinants of Health   Financial Resource Strain: Low Risk  (10/04/2023)   Overall Financial Resource Strain (CARDIA)    Difficulty of Paying Living Expenses: Not very hard  Food Insecurity: No Food Insecurity (10/04/2023)   Hunger Vital Sign    Worried About Running Out of Food in the Last Year: Never true    Ran Out of Food in the Last Year: Never true  Transportation Needs: No Transportation Needs (10/04/2023)   PRAPARE - Administrator, Civil Service (Medical): No    Lack of Transportation (Non-Medical): No  Physical Activity: Inactive (10/04/2023)   Exercise Vital Sign    Days of Exercise per Week: 0 days    Minutes of Exercise per Session: 0 min  Stress: No Stress Concern Present (10/04/2023)   [employer] of Occupational Health - Occupational Stress Questionnaire    Feeling of Stress : Not at all  Social Connections: Moderately Isolated (10/04/2023)   Social Connection and Isolation Panel [NHANES]     Frequency of Communication with Friends and Family: More than three times a week    Frequency of Social Gatherings with Friends and Family: More than three times a week    Attends Religious Services: Never    Database administrator or Organizations: No    Attends Banker Meetings: Never    Marital Status: Married    Activities of Daily Living    10/04/2023    8:18 AM  In your present state of health, do you have any difficulty performing the following activities:  Hearing? 0  Vision? 1  Comment some vision issues; has an appt  scheduled  Difficulty concentrating or making decisions? 1  Walking or climbing stairs? 1  Comment stairs are difficult  Dressing or bathing? 0  Doing errands, shopping? 0  Preparing Food and eating ? N  Using the Toilet? N  In the past six months, have you accidently leaked urine? Y  Do you have problems with loss of bowel control? N  Managing your Medications? N  Managing your Finances? N  Housekeeping or managing your Housekeeping? N    Patient Education/ Literacy How often do you need to have someone help you when you read instructions, pamphlets, or other written materials from your doctor or pharmacy?: 1 - Never What is the last grade level you completed in school?: 12th grade  Exercise    Diet Patient reports consuming 2 meals a day and 0 snack(s) a day Patient reports that her primary diet is: Regular Patient reports that she does have regular access to food.   Depression Screen    10/04/2023    8:10 AM 09/28/2022    8:07 AM 02/02/2022   10:25 AM 09/18/2019    1:30 PM 12/13/2018    4:11 PM 09/06/2018    3:59 PM 08/17/2017   10:14 AM  PHQ 2/9 Scores  PHQ - 2 Score 2 0 0 5 2 4  0  PHQ- 9 Score 9 0  21 13 18       Fall Risk    10/04/2023    8:09 AM 03/16/2023   11:17 AM 09/28/2022    8:07 AM 02/02/2022   10:24 AM 12/27/2019    8:18 AM  Fall Risk   Falls in the past year? 0 0 0 0 0  Number falls in past yr: 0 0 0 0 0  Injury  with Fall? 0 0 0 0 0  Risk for fall due to : No Fall Risks No Fall Risks No Fall Risks No Fall Risks   Follow up Falls evaluation completed Falls evaluation completed Falls evaluation completed Falls prevention discussed;Falls evaluation completed      Objective:  JAMMIE TROUP seemed alert and oriented and she participated appropriately during our telephone visit.  Blood Pressure Weight BMI  BP Readings from Last 3 Encounters:  03/16/23 133/73  08/04/22 106/71  03/20/21 120/62   Wt Readings from Last 3 Encounters:  03/16/23 203 lb (92.1 kg)  08/04/22 197 lb (89.4 kg)  02/02/22 175 lb (79.4 kg)   BMI Readings from Last 1 Encounters:  03/16/23 32.77 kg/m    *Unable to obtain current vital signs, weight, and BMI due to telephone visit type  Hearing/Vision  Kyana did not seem to have difficulty with hearing/understanding during the telephone conversation Reports that she has had a formal eye exam by an eye care professional within the past year Reports that she has not had a formal hearing evaluation within the past year *Unable to fully assess hearing and vision during telephone visit type  Cognitive Function:    10/04/2023    8:20 AM 09/28/2022    8:12 AM  6CIT Screen  What Year? 0 points 0 points  What month? 0 points 0 points  What time? 0 points 0 points  Count back from 20 0 points 0 points  Months in reverse 0 points 0 points  Repeat phrase 0 points 0 points  Total Score 0 points 0 points   (Normal:0-7, Significant for Dysfunction: >8)  Normal Cognitive Function Screening: Yes   Immunization & Health Maintenance Record Immunization History  Administered  Date(s) Administered   Influenza Whole 09/24/2008   Influenza-Unspecified 09/08/2017   Pneumococcal Polysaccharide-23 04/04/2012   Td 12/28/2005   Tdap 09/18/2019    Health Maintenance  Topic Date Due   COVID-19 Vaccine (1 - 2023-24 season) 10/20/2023 (Originally 08/29/2023)   Zoster Vaccines-  Shingrix (1 of 2) 01/04/2024 (Originally 01/26/2021)   INFLUENZA VACCINE  03/27/2024 (Originally 07/29/2023)   MAMMOGRAM  09/21/2024   Medicare Annual Wellness (AWV)  10/03/2024   Cervical Cancer Screening (HPV/Pap Cotest)  03/15/2028   DTaP/Tdap/Td (3 - Td or Tdap) 09/17/2029   Colonoscopy  02/24/2033   Hepatitis C Screening  Completed   HIV Screening  Completed   Pneumococcal Vaccine 51-35 Years old  Aged Out   HPV VACCINES  Aged Out       Assessment  This is a routine wellness examination for [employer].  Health Maintenance: Due or Overdue There are no preventive care reminders to display for this patient.   Michaela Dodson does not need a referral for Community Assistance: Care Management:   no Social Work:    no Prescription Assistance:  no Nutrition/Diabetes Education:  no   Plan:  Personalized Goals  Goals Addressed               This Visit's Progress     Patient Stated (pt-stated)        10/04/2023 AWV Goal: Exercise for General Health  Patient will verbalize understanding of the benefits of increased physical activity: Exercising regularly is important. It will improve your overall fitness, flexibility, and endurance. Regular exercise also will improve your overall health. It can help you control your weight, reduce stress, and improve your bone density. Over the next year, patient will increase physical activity as tolerated with a goal of at least 150 minutes of moderate physical activity per week.  You can tell that you are exercising at a moderate intensity if your heart starts beating faster and you start breathing faster but can still hold a conversation. Moderate-intensity exercise ideas include: Walking 1 mile (1.6 km) in about 15 minutes Biking Hiking Golfing Dancing Water aerobics Patient will verbalize understanding of everyday activities that increase physical activity by providing examples like the following: Yard work, such as: Land Gardening Washing windows or floors Patient will be able to explain general safety guidelines for exercising:  Before you start a new exercise program, talk with your health care provider. Do not exercise so much that you hurt yourself, feel dizzy, or get very short of breath. Wear comfortable clothes and wear shoes with good support. Drink plenty of water while you exercise to prevent dehydration or heat stroke. Work out until your breathing and your heartbeat get faster.        Personalized Health Maintenance & Screening Recommendations  Influenza vaccine Shingles vaccine Mammogram- scheduled at the end of the month.  Declined influenza and shingles vaccine.   Lung Cancer Screening Recommended: no (Low Dose CT Chest recommended if Age 43-80 years, 20 pack-year currently smoking OR have quit w/in past 15 years) Hepatitis C Screening recommended: no HIV Screening recommended: no  Advanced Directives: Written information was not prepared per patient's request.  Referrals & Orders No orders of the defined types were placed in this encounter.   Follow-up Plan Follow-up with Agapito Games, MD as planned Medicare wellness visit in one year.  Patient will access AVS on my  chart.   I have personally reviewed and noted the following in the patient's chart:   Medical and social history Use of alcohol, tobacco or illicit drugs  Current medications and supplements Functional ability and status Nutritional status Physical activity Advanced directives List of other physicians Hospitalizations, surgeries, and ER visits in previous 12 months Vitals Screenings to include cognitive, depression, and falls Referrals and appointments  In addition, I have reviewed and discussed with Michaela Dodson certain preventive protocols, quality metrics, and best practice recommendations. A  written personalized care plan for preventive services as well as general preventive health recommendations is available and can be mailed to the patient at her request.      Modesto Charon, RN BSN  10/04/2023

## 2023-10-06 DIAGNOSIS — M5459 Other low back pain: Secondary | ICD-10-CM | POA: Diagnosis not present

## 2023-10-06 DIAGNOSIS — M47816 Spondylosis without myelopathy or radiculopathy, lumbar region: Secondary | ICD-10-CM | POA: Diagnosis not present

## 2023-10-19 ENCOUNTER — Encounter: Payer: Self-pay | Admitting: Sports Medicine

## 2023-10-21 ENCOUNTER — Telehealth: Payer: Self-pay | Admitting: Sports Medicine

## 2023-10-21 NOTE — Telephone Encounter (Signed)
[facility] approval please, right knee only, she did well with Synvisc in December 2023.  Phone note associated with this was on 10/30/2022.

## 2023-10-22 DIAGNOSIS — Z1231 Encounter for screening mammogram for malignant neoplasm of breast: Secondary | ICD-10-CM | POA: Diagnosis not present

## 2023-10-22 DIAGNOSIS — H40003 Preglaucoma, unspecified, bilateral: Secondary | ICD-10-CM | POA: Diagnosis not present

## 2023-10-22 DIAGNOSIS — H40033 Anatomical narrow angle, bilateral: Secondary | ICD-10-CM | POA: Diagnosis not present

## 2023-10-22 DIAGNOSIS — R92333 Mammographic heterogeneous density, bilateral breasts: Secondary | ICD-10-CM | POA: Diagnosis not present

## 2023-10-22 LAB — HM MAMMOGRAPHY

## 2023-10-25 ENCOUNTER — Encounter: Payer: Self-pay | Admitting: Family Medicine

## 2023-11-01 DIAGNOSIS — Z6830 Body mass index (BMI) 30.0-30.9, adult: Secondary | ICD-10-CM | POA: Diagnosis not present

## 2023-11-01 DIAGNOSIS — R634 Abnormal weight loss: Secondary | ICD-10-CM | POA: Diagnosis not present

## 2023-11-01 DIAGNOSIS — E66811 Obesity, class 1: Secondary | ICD-10-CM | POA: Diagnosis not present

## 2023-11-01 DIAGNOSIS — Z903 Acquired absence of stomach [part of]: Secondary | ICD-10-CM | POA: Diagnosis not present

## 2023-11-03 NOTE — Telephone Encounter (Signed)
PA information submitted via [email or web address] for Orthovisc Paperwork has been printed and given to Dr. T for signatures. Once obtained, information will be faxed to [employer] at [phone]  

## 2023-11-05 NOTE — Telephone Encounter (Signed)
Patient notified of  approval and 155 copay per week and will get it billed and will call back and get scheduled .

## 2023-11-08 DIAGNOSIS — F3110 Bipolar disorder, current episode manic without psychotic features, unspecified: Secondary | ICD-10-CM | POA: Diagnosis not present

## 2023-11-11 ENCOUNTER — Telehealth: Payer: Self-pay | Admitting: Sports Medicine

## 2023-11-11 NOTE — Telephone Encounter (Signed)
Patient called she is asking why her co payment was 155.00 for [facility] would like an explantation please contact patient at  [phone]

## 2023-11-13 ENCOUNTER — Other Ambulatory Visit: Payer: Self-pay | Admitting: Family Medicine

## 2023-11-15 MED ORDER — ESOMEPRAZOLE MAGNESIUM 20 MG PO CPDR: 20.0000 mg | DELAYED_RELEASE_CAPSULE | Freq: Every morning | ORAL | 0 refills | Status: DC

## 2023-11-22 DIAGNOSIS — M47816 Spondylosis without myelopathy or radiculopathy, lumbar region: Secondary | ICD-10-CM | POA: Diagnosis not present

## 2023-11-22 DIAGNOSIS — G894 Chronic pain syndrome: Secondary | ICD-10-CM | POA: Diagnosis not present

## 2023-11-22 DIAGNOSIS — M5459 Other low back pain: Secondary | ICD-10-CM | POA: Diagnosis not present

## 2023-12-19 ENCOUNTER — Encounter: Payer: Self-pay | Admitting: Family Medicine

## 2023-12-27 ENCOUNTER — Telehealth: Payer: Self-pay

## 2023-12-27 NOTE — Telephone Encounter (Signed)
Copied from CRM [id number]. Topic: Clinical - Medical Advice >> Dec 27, 2023  3:30 PM Almira Coaster wrote: Reason for CRM: Patient is calling with complaints of a UTI would like to know if she can just pass by tomorrow to see if she can leave a sample, advised patient that an appointment with a provider would need to be scheduled;however, there were no openings. She is free to come in anytime tomorrow after 2:30pm.

## 2023-12-27 NOTE — Telephone Encounter (Signed)
 Delta to put on nurse schedule

## 2023-12-28 ENCOUNTER — Ambulatory Visit (INDEPENDENT_AMBULATORY_CARE_PROVIDER_SITE_OTHER): Payer: Medicare Other | Admitting: Family Medicine

## 2023-12-28 ENCOUNTER — Encounter: Payer: Self-pay | Admitting: Family Medicine

## 2023-12-28 VITALS — BP 118/80 | HR 89 | Ht 66.0 in

## 2023-12-28 DIAGNOSIS — R109 Unspecified abdominal pain: Secondary | ICD-10-CM

## 2023-12-28 LAB — POCT URINALYSIS DIP (CLINITEK)
Bilirubin, UA: NEGATIVE
Glucose, UA: NEGATIVE mg/dL
Ketones, POC UA: NEGATIVE mg/dL
Leukocytes, UA: NEGATIVE
Nitrite, UA: POSITIVE — AB
POC PROTEIN,UA: NEGATIVE
Spec Grav, UA: 1.015 (ref 1.010–1.025)
Urobilinogen, UA: 0.2 U/dL
pH, UA: 6 (ref 5.0–8.0)

## 2023-12-28 MED ORDER — NITROFURANTOIN MONOHYD MACRO 100 MG PO CAPS: 100.0000 mg | ORAL_CAPSULE | Freq: Two times a day (BID) | ORAL | 0 refills | Status: DC

## 2023-12-28 NOTE — Progress Notes (Signed)
 HPI  Patient comes in today with left side flank pain since sun. No fever, chills, medication problems. Obtained a urine.                     Assessment and Plan:  Reported results to Dr. Alvan, she is going to send in an antibiotic for pt to start. Pt is going to f/u with Dr. Alvan on thrus 12/29/22 if pain is no better.

## 2023-12-28 NOTE — Progress Notes (Signed)
UA positive for nitrites.  No dysuria or hematuria will treat with Macrobid and send for culture if not improving please let us know as there are other causes for left-sided flank pain.

## 2023-12-30 ENCOUNTER — Encounter: Payer: Self-pay | Admitting: Family Medicine

## 2023-12-30 ENCOUNTER — Ambulatory Visit: Payer: Medicare Other

## 2023-12-30 ENCOUNTER — Ambulatory Visit (INDEPENDENT_AMBULATORY_CARE_PROVIDER_SITE_OTHER): Payer: Medicare Other | Admitting: Family Medicine

## 2023-12-30 VITALS — BP 123/59 | HR 95 | Ht 66.0 in | Wt 201.0 lb

## 2023-12-30 DIAGNOSIS — R1032 Left lower quadrant pain: Secondary | ICD-10-CM

## 2023-12-30 DIAGNOSIS — D259 Leiomyoma of uterus, unspecified: Secondary | ICD-10-CM | POA: Diagnosis not present

## 2023-12-30 DIAGNOSIS — R102 Pelvic and perineal pain: Secondary | ICD-10-CM | POA: Diagnosis not present

## 2023-12-30 DIAGNOSIS — N888 Other specified noninflammatory disorders of cervix uteri: Secondary | ICD-10-CM | POA: Diagnosis not present

## 2023-12-30 DIAGNOSIS — R233 Spontaneous ecchymoses: Secondary | ICD-10-CM

## 2023-12-30 NOTE — Progress Notes (Signed)
   Acute Office Visit  Subjective:     Patient ID: Michaela Dodson, female    DOB: 24-May-1971, 53 y.o.   MRN: [id number]  Chief Complaint  Patient presents with   Abdominal Pain    HPI Patient is in today for LLQ pain  x 5 days - No pain with eating.  No change in BM.  Tender to palpitations.  No nausea vomiting or dysuria.  She initially had come in 2 days ago for possible UTI thinking it could be that.  Urinalysis did show some leukocytes so we sent in a prescription for nitrofurantoin  but she is not feeling any better in that regard.  No hematuria or blood in the stool.  Had a uterine ablation.  1 to let me know she has been on phentermine  for weight loss and says it really has decreased her appetite so she has not been eating much but she really is not losing any weight.  ROS      Objective:    BP (!) 123/59   Pulse 95   Ht 5' 6 (1.676 m)   Wt 201 lb (91.2 kg)   SpO2 100%   BMI 32.44 kg/m    Physical Exam Vitals reviewed.  Constitutional:      Appearance: Normal appearance.  HENT:     Head: Normocephalic.  Pulmonary:     Effort: Pulmonary effort is normal.  Abdominal:     Palpations: Abdomen is soft.     Tenderness: There is abdominal tenderness in the right lower quadrant, suprapubic area and left lower quadrant.     Comments: Very tender in the left lower quadrant only mildly tender in the right lower quadrant and mildly tender over the suprapubic area.  Neurological:     Mental Status: She is alert and oriented to person, place, and time.  Psychiatric:        Mood and Affect: Mood normal.        Behavior: Behavior normal.     No results found for any visits on 12/30/23.      Assessment & Plan:   Problem List Items Addressed This Visit   None Visit Diagnoses       LLQ pain    -  Primary   Relevant Orders   US  Pelvic Complete With Transvaginal   DG Abd 1 View       Left lower quadrant pain-we discussed further workup with a KUB to rule out  significant stool burden even though she says she is going regularly and does not feel constipated.  Still consider secondary to UTI but her pain is definitely much more left-sided localized and we are waiting on the culture to confirm that she is on the correct antibiotic.  Also consider ovarian cyst etc. will get her scheduled for pelvic ultrasound for further workup.  No orders of the defined types were placed in this encounter.   No follow-ups on file.  Dorothyann Byars, MD

## 2023-12-31 NOTE — Telephone Encounter (Signed)
Orders Placed This Encounter  Procedures   CBC with Differential/Platelet   CMP14+EGFR   INR/PT

## 2024-01-01 LAB — URINE CULTURE

## 2024-01-03 ENCOUNTER — Encounter: Payer: Self-pay | Admitting: Family Medicine

## 2024-01-03 DIAGNOSIS — R233 Spontaneous ecchymoses: Secondary | ICD-10-CM | POA: Diagnosis not present

## 2024-01-03 DIAGNOSIS — R1032 Left lower quadrant pain: Secondary | ICD-10-CM | POA: Diagnosis not present

## 2024-01-03 DIAGNOSIS — E66811 Obesity, class 1: Secondary | ICD-10-CM | POA: Diagnosis not present

## 2024-01-03 DIAGNOSIS — Z6831 Body mass index (BMI) 31.0-31.9, adult: Secondary | ICD-10-CM | POA: Diagnosis not present

## 2024-01-03 DIAGNOSIS — Z903 Acquired absence of stomach [part of]: Secondary | ICD-10-CM | POA: Diagnosis not present

## 2024-01-03 NOTE — Progress Notes (Signed)
 Hi Michaela Dodson, your urine culture did come back positive.  The antibiotic that I put you on should have cleared up based on the culture report so hopefully you are feeling better.  If not you are still having that discomfort in the left side please let me know.

## 2024-01-03 NOTE — Progress Notes (Signed)
 Hi Michaela Dodson, pelvic ultrasound shows some fibroids in the uterus.  And a possible luteal cyst on the left ovary.  This to the time that these form they do not really cause pain or problem but if they get a little larger it can cause some discomfort.  The good news is is that it usually resolves on its own within about 2 weeks.  I think the cyst is most likely causing your pain.  Since it does seem to be a little better than it was lets give it through this week to see if you continue to improve, and if not then please let me know.

## 2024-01-04 LAB — CMP14+EGFR
ALT: 7 [IU]/L (ref 0–32)
AST: 11 [IU]/L (ref 0–40)
Albumin: 4.1 g/dL (ref 3.8–4.9)
Alkaline Phosphatase: 65 [IU]/L (ref 44–121)
BUN/Creatinine Ratio: 14 (ref 9–23)
BUN: 9 mg/dL (ref 6–24)
Bilirubin Total: 0.8 mg/dL (ref 0.0–1.2)
CO2: 22 mmol/L (ref 20–29)
Calcium: 9.3 mg/dL (ref 8.7–10.2)
Chloride: 105 mmol/L (ref 96–106)
Creatinine, Ser: 0.65 mg/dL (ref 0.57–1.00)
Globulin, Total: 2.1 g/dL (ref 1.5–4.5)
Glucose: 90 mg/dL (ref 70–99)
Potassium: 4.9 mmol/L (ref 3.5–5.2)
Sodium: 142 mmol/L (ref 134–144)
Total Protein: 6.2 g/dL (ref 6.0–8.5)
eGFR: 106 mL/min/{1.73_m2} (ref >59)

## 2024-01-04 LAB — CBC WITH DIFFERENTIAL/PLATELET
Basophils Absolute: 0 10*3/uL (ref 0.0–0.2)
Basos: 1 %
EOS (ABSOLUTE): 0.2 10*3/uL (ref 0.0–0.4)
Eos: 2 %
Hematocrit: 42 % (ref 34.0–46.6)
Hemoglobin: 13.8 g/dL (ref 11.1–15.9)
Immature Grans (Abs): 0 10*3/uL (ref 0.0–0.1)
Immature Granulocytes: 0 %
Lymphocytes Absolute: 3.9 10*3/uL — ABNORMAL HIGH (ref 0.7–3.1)
Lymphs: 44 %
MCH: 31.6 pg (ref 26.6–33.0)
MCHC: 32.9 g/dL (ref 31.5–35.7)
MCV: 96 fL (ref 79–97)
Monocytes Absolute: 0.7 10*3/uL (ref 0.1–0.9)
Monocytes: 8 %
Neutrophils Absolute: 4 10*3/uL (ref 1.4–7.0)
Neutrophils: 45 %
Platelets: 275 10*3/uL (ref 150–450)
RBC: 4.37 x10E6/uL (ref 3.77–5.28)
RDW: 11.9 % (ref 11.7–15.4)
WBC: 8.8 10*3/uL (ref 3.4–10.8)

## 2024-01-04 LAB — PROTIME-INR
INR: 1 (ref 0.9–1.2)
Prothrombin Time: 10.9 s (ref 9.1–12.0)

## 2024-01-05 NOTE — Progress Notes (Signed)
 Your lab work is within acceptable range and there are no concerning findings.   ?

## 2024-01-07 ENCOUNTER — Encounter: Payer: Self-pay | Admitting: Family Medicine

## 2024-01-07 DIAGNOSIS — R399 Unspecified symptoms and signs involving the genitourinary system: Secondary | ICD-10-CM

## 2024-01-07 NOTE — Progress Notes (Signed)
 Hi Barrington Ellison looks OK no worrisome finding.  Bowel gas pattern looks normal no sign of constipation or obstruction.  Are you feeling any better?

## 2024-01-15 ENCOUNTER — Encounter: Payer: Self-pay | Admitting: Sports Medicine

## 2024-01-17 NOTE — Telephone Encounter (Signed)
K to order culture and she can just see the lab person to have done.

## 2024-01-19 DIAGNOSIS — R399 Unspecified symptoms and signs involving the genitourinary system: Secondary | ICD-10-CM | POA: Diagnosis not present

## 2024-01-21 LAB — URINE CULTURE

## 2024-01-24 ENCOUNTER — Encounter: Payer: Self-pay | Admitting: Family Medicine

## 2024-01-24 MED ORDER — SULFAMETHOXAZOLE-TRIMETHOPRIM 800-160 MG PO TABS: 1.0000 | ORAL_TABLET | Freq: Two times a day (BID) | ORAL | 0 refills | Status: DC

## 2024-01-24 NOTE — Progress Notes (Signed)
Hi Michaela Dodson, urine specimen came back positive for E. coli.  I am going to send over a course of Bactrim to cleared up based on the culture this should absolutely work to take care of the specific bacteria.

## 2024-02-03 ENCOUNTER — Other Ambulatory Visit (INDEPENDENT_AMBULATORY_CARE_PROVIDER_SITE_OTHER): Payer: Medicare Other

## 2024-02-03 ENCOUNTER — Ambulatory Visit (INDEPENDENT_AMBULATORY_CARE_PROVIDER_SITE_OTHER): Payer: Medicare Other | Admitting: Sports Medicine

## 2024-02-03 DIAGNOSIS — M170 Bilateral primary osteoarthritis of knee: Secondary | ICD-10-CM

## 2024-02-03 MED ORDER — HYALURONAN 30 MG/2ML IX SOSY
30.0000 mg | PREFILLED_SYRINGE | Freq: Once | INTRA_ARTICULAR | Status: AC
  Administered 2024-02-03: 30 mg via INTRA_ARTICULAR

## 2024-02-03 NOTE — Progress Notes (Signed)
    Procedures performed today:    Procedure: Real-time Ultrasound Guided injection of the right knee Device: Samsung HS60  Verbal informed consent obtained.  Time-out conducted.  Noted no overlying erythema, induration, or other signs of local infection.  Skin prepped in a sterile fashion.  Local anesthesia: Topical Ethyl chloride.  With sterile technique and under real time ultrasound guidance: Mild effusion noted, injected 1 cc kenalog  40, 1 cc lidocaine, syringe switched and 30 mg/2 mL of OrthoVisc (sodium hyaluronate) in a prefilled syringe was injected easily into the knee through a 22-gauge needle. Completed without difficulty  Advised to call if fevers/chills, erythema, induration, drainage, or persistent bleeding.  Images permanently stored and available for review in PACS.  Impression: Technically successful ultrasound guided injection.  Independent interpretation of notes and tests performed by another provider:   None.  Brief History, Exam, Impression, and Recommendations:    Primary osteoarthritis of both knees Did well with Synvisc in 2023 bilateral. Right knee steroid injection was done in April of this year. Due to increasing pain we did a steroid injection and started Orthovisc No. 1 right knee, return in 1 week for #2 of 4.    ____________________________________________ Debby PARAS. Curtis, M.D., ABFM., CAQSM., AME. Primary Care and Sports Medicine [place] [facility]  Adjunct Professor of [facility] Medicine  [employer]  Restaurant Manager, [employer]

## 2024-02-03 NOTE — Assessment & Plan Note (Addendum)
 Did well with Synvisc in 2023 bilateral. Right knee steroid injection was done in April of this year. Due to increasing pain we did a steroid injection and started Orthovisc No. 1 right knee, return in 1 week for #2 of 4.

## 2024-02-03 NOTE — Addendum Note (Signed)
 Addended by: Arvel Lather A on: 02/03/2024 04:25 PM   Modules accepted: Orders

## 2024-02-03 NOTE — Code Documentation (Signed)
 done

## 2024-02-07 DIAGNOSIS — F3110 Bipolar disorder, current episode manic without psychotic features, unspecified: Secondary | ICD-10-CM | POA: Diagnosis not present

## 2024-02-09 ENCOUNTER — Other Ambulatory Visit: Payer: Self-pay | Admitting: Physician Assistant

## 2024-02-10 ENCOUNTER — Other Ambulatory Visit (INDEPENDENT_AMBULATORY_CARE_PROVIDER_SITE_OTHER): Payer: Medicare Other

## 2024-02-10 ENCOUNTER — Ambulatory Visit (INDEPENDENT_AMBULATORY_CARE_PROVIDER_SITE_OTHER): Payer: Medicare Other | Admitting: Sports Medicine

## 2024-02-10 DIAGNOSIS — M170 Bilateral primary osteoarthritis of knee: Secondary | ICD-10-CM | POA: Diagnosis not present

## 2024-02-10 NOTE — Progress Notes (Signed)
    Procedures performed today:    Procedure: Real-time Ultrasound Guided injection of the right knee Device: Samsung HS60  Verbal informed consent obtained.  Time-out conducted.  Noted no overlying erythema, induration, or other signs of local infection.  Skin prepped in a sterile fashion.  Local anesthesia: Topical Ethyl chloride.  With sterile technique and under real time ultrasound guidance: Mild effusion noted, injected 1 cc kenalog 40, 1 cc lidocaine, syringe switched and 30 mg/2 mL of OrthoVisc (sodium hyaluronate) in a prefilled syringe was injected easily into the knee through a 22-gauge needle. Completed without difficulty  Advised to call if fevers/chills, erythema, induration, drainage, or persistent bleeding.  Images permanently stored and available for review in PACS.  Impression: Technically successful ultrasound guided injection.  Independent interpretation of notes and tests performed by another provider:   None.  Brief History, Exam, Impression, and Recommendations:    Primary osteoarthritis of both knees Did well send this 2023 bilateral. Right knee steroid injection February 2025 with the first Orthovisc. Today we did Orthovisc 2 of 4 right knee, return in 1 week for #3 of 4 right knee    ____________________________________________ Ihor Austin. Benjamin Stain, M.D., ABFM., CAQSM., AME. Primary Care and Sports Medicine [facility]  Adjunct Professor of Family Medicine  [place] [employer] [facility] [employer]  Restaurant manager, fast food

## 2024-02-10 NOTE — Assessment & Plan Note (Signed)
Did well send this 2023 bilateral. Right knee steroid injection February 2025 with the first Orthovisc. Today we did Orthovisc 2 of 4 right knee, return in 1 week for #3 of 4 right knee

## 2024-02-11 DIAGNOSIS — M170 Bilateral primary osteoarthritis of knee: Secondary | ICD-10-CM

## 2024-02-11 MED ORDER — HYALURONAN 30 MG/2ML IX SOSY
30.0000 mg | PREFILLED_SYRINGE | Freq: Once | INTRA_ARTICULAR | Status: AC
  Administered 2024-02-11: 30 mg via INTRA_ARTICULAR

## 2024-02-11 NOTE — Addendum Note (Signed)
Addended by: Carren Rang A on: 02/11/2024 10:22 AM   Modules accepted: Orders

## 2024-02-17 ENCOUNTER — Ambulatory Visit (INDEPENDENT_AMBULATORY_CARE_PROVIDER_SITE_OTHER): Payer: Medicare Other | Admitting: Sports Medicine

## 2024-02-17 ENCOUNTER — Other Ambulatory Visit: Payer: Self-pay

## 2024-02-17 ENCOUNTER — Ambulatory Visit: Payer: Medicare Other

## 2024-02-17 DIAGNOSIS — M47816 Spondylosis without myelopathy or radiculopathy, lumbar region: Secondary | ICD-10-CM

## 2024-02-17 DIAGNOSIS — M170 Bilateral primary osteoarthritis of knee: Secondary | ICD-10-CM

## 2024-02-17 DIAGNOSIS — M5134 Other intervertebral disc degeneration, thoracic region: Secondary | ICD-10-CM | POA: Diagnosis not present

## 2024-02-17 DIAGNOSIS — M5136 Other intervertebral disc degeneration, lumbar region with discogenic back pain only: Secondary | ICD-10-CM | POA: Diagnosis not present

## 2024-02-17 MED ORDER — HYALURONAN 30 MG/2ML IX SOSY
30.0000 mg | PREFILLED_SYRINGE | Freq: Once | INTRA_ARTICULAR | Status: AC
  Administered 2024-02-17: 30 mg via INTRA_ARTICULAR

## 2024-02-17 MED ORDER — PREDNISONE 50 MG PO TABS
ORAL_TABLET | ORAL | 0 refills | Status: DC
Start: 2024-02-17 — End: 2024-03-29

## 2024-02-17 NOTE — Assessment & Plan Note (Addendum)
Michaela Dodson also had a fall recently, she does have a history of L3-L5 facet arthropathy, she has had several facet RFAs with Dr. Laurian Brim.  05/28/2021 bilateral L3-4 L4-5 RFA 02/08/23 - right L4-5, L4-S1 RFA 03/08/23 - left L4-5, L5-S2 RFA 10/06/23 - left L5-S1 RFA   After the fall she developed pain left quadratus lumborum. I would like some x-rays, we will do a burst of prednisone and she can contact Dr. Laurian Brim for further follow-up treatment.

## 2024-02-17 NOTE — Assessment & Plan Note (Signed)
Michaela Dodson returns, she did well with Orthovisc bilaterally in 2023. We did a right knee steroid injection February 2025 with her first Orthovisc. Today we did Orthovisc No. 3 of 4 right knee. Return in 1 week for #4 of 4.

## 2024-02-17 NOTE — Progress Notes (Signed)
    Procedures performed today:    Procedure: Real-time Ultrasound Guided injection of the right knee Device: Samsung HS60  Verbal informed consent obtained.  Time-out conducted.  Noted no overlying erythema, induration, or other signs of local infection.  Skin prepped in a sterile fashion.  Local anesthesia: Topical Ethyl chloride.  With sterile technique and under real time ultrasound guidance: Mild effusion noted, 30 mg/2 mL of OrthoVisc (sodium hyaluronate) in a prefilled syringe was injected easily into the knee through a 22-gauge needle. Completed without difficulty  Advised to call if fevers/chills, erythema, induration, drainage, or persistent bleeding.  Images permanently stored and available for review in PACS.  Impression: Technically successful ultrasound guided injection.  Independent interpretation of notes and tests performed by another provider:   None.  Brief History, Exam, Impression, and Recommendations:    Primary osteoarthritis of both knees Michaela Dodson returns, she did well with Orthovisc bilaterally in 2023. We did a right knee steroid injection February 2025 with her first Orthovisc. Today we did Orthovisc No. 3 of 4 right knee. Return in 1 week for #4 of 4.  Lumbar spondylosis Michaela Dodson also had a fall recently, she does have a history of L3-L5 facet arthropathy, she has had several facet RFAs with Dr. Laurian Brim.  05/28/2021 bilateral L3-4 L4-5 RFA 02/08/23 - right L4-5, L4-S1 RFA 03/08/23 - left L4-5, L5-S2 RFA 10/06/23 - left L5-S1 RFA   After the fall she developed pain left quadratus lumborum. I would like some x-rays, we will do a burst of prednisone and she can contact Dr. Laurian Brim for further follow-up treatment.  Chronic process with exacerbation and pharmacologic intervention  ____________________________________________ Michaela Dodson. Michaela Dodson, M.D., ABFM., CAQSM., AME. Primary Care and Sports Medicine  [facility]  Adjunct  Professor of Family Medicine  [place] [employer] [facility] [employer]  Restaurant manager, fast food

## 2024-02-22 DIAGNOSIS — G8929 Other chronic pain: Secondary | ICD-10-CM | POA: Diagnosis not present

## 2024-02-22 DIAGNOSIS — M545 Low back pain, unspecified: Secondary | ICD-10-CM | POA: Diagnosis not present

## 2024-02-22 DIAGNOSIS — M7918 Myalgia, other site: Secondary | ICD-10-CM | POA: Diagnosis not present

## 2024-02-24 ENCOUNTER — Encounter: Payer: Self-pay | Admitting: Sports Medicine

## 2024-02-24 ENCOUNTER — Ambulatory Visit (INDEPENDENT_AMBULATORY_CARE_PROVIDER_SITE_OTHER): Payer: Medicare Other | Admitting: Sports Medicine

## 2024-02-24 ENCOUNTER — Other Ambulatory Visit (INDEPENDENT_AMBULATORY_CARE_PROVIDER_SITE_OTHER): Payer: Self-pay

## 2024-02-24 DIAGNOSIS — M170 Bilateral primary osteoarthritis of knee: Secondary | ICD-10-CM

## 2024-02-24 MED ORDER — HYALURONAN 30 MG/2ML IX SOSY
30.0000 mg | PREFILLED_SYRINGE | Freq: Once | INTRA_ARTICULAR | Status: AC
  Administered 2024-02-24: 30 mg via INTRA_ARTICULAR

## 2024-02-24 NOTE — Assessment & Plan Note (Signed)
 Michaela Dodson returns, she did well with Orthovisc bilaterally in 2023. Steroid injection done in February 2025, today we did Orthovisc No. 4 of 4 right knee.

## 2024-02-24 NOTE — Addendum Note (Signed)
 Addended by: Samule Dry on: 02/24/2024 11:17 AM   Modules accepted: Orders

## 2024-02-24 NOTE — Progress Notes (Signed)
    Procedures performed today:    Procedure: Real-time Ultrasound Guided injection of the right knee Device: Samsung HS60  Verbal informed consent obtained.  Time-out conducted.  Noted no overlying erythema, induration, or other signs of local infection.  Skin prepped in a sterile fashion.  Local anesthesia: Topical Ethyl chloride.  With sterile technique and under real time ultrasound guidance: No effusion noted, 30 mg/2 mL of OrthoVisc (sodium hyaluronate) in a prefilled syringe was injected easily into the knee through a 22-gauge needle. Completed without difficulty  Advised to call if fevers/chills, erythema, induration, drainage, or persistent bleeding.  Images permanently stored and available for review in PACS.  Impression: Technically successful ultrasound guided injection.  Independent interpretation of notes and tests performed by another provider:   None.  Brief History, Exam, Impression, and Recommendations:    Primary osteoarthritis of both knees Michaela Dodson returns, she did well with Orthovisc bilaterally in 2023. Steroid injection done in February 2025, today we did Orthovisc No. 4 of 4 right knee.    ____________________________________________ Ihor Austin. Benjamin Stain, M.D., ABFM., CAQSM., AME. Primary Care and Sports Medicine [place] [facility]  Adjunct Professor of Family Medicine  [place] [employer]  Restaurant manager, fast food

## 2024-03-16 DIAGNOSIS — M5459 Other low back pain: Secondary | ICD-10-CM | POA: Diagnosis not present

## 2024-03-16 DIAGNOSIS — M7918 Myalgia, other site: Secondary | ICD-10-CM | POA: Diagnosis not present

## 2024-03-16 DIAGNOSIS — M5126 Other intervertebral disc displacement, lumbar region: Secondary | ICD-10-CM | POA: Diagnosis not present

## 2024-03-16 DIAGNOSIS — M48061 Spinal stenosis, lumbar region without neurogenic claudication: Secondary | ICD-10-CM | POA: Diagnosis not present

## 2024-03-16 DIAGNOSIS — M47816 Spondylosis without myelopathy or radiculopathy, lumbar region: Secondary | ICD-10-CM | POA: Diagnosis not present

## 2024-03-16 DIAGNOSIS — M5136 Other intervertebral disc degeneration, lumbar region with discogenic back pain only: Secondary | ICD-10-CM | POA: Diagnosis not present

## 2024-03-20 DIAGNOSIS — E66811 Obesity, class 1: Secondary | ICD-10-CM | POA: Diagnosis not present

## 2024-03-20 DIAGNOSIS — K912 Postsurgical malabsorption, not elsewhere classified: Secondary | ICD-10-CM | POA: Diagnosis not present

## 2024-03-20 DIAGNOSIS — Z903 Acquired absence of stomach [part of]: Secondary | ICD-10-CM | POA: Diagnosis not present

## 2024-03-20 DIAGNOSIS — Z6831 Body mass index (BMI) 31.0-31.9, adult: Secondary | ICD-10-CM | POA: Diagnosis not present

## 2024-03-25 ENCOUNTER — Encounter: Payer: Self-pay | Admitting: Family Medicine

## 2024-03-27 NOTE — Telephone Encounter (Signed)
OK for appt.

## 2024-03-28 ENCOUNTER — Ambulatory Visit (HOSPITAL_BASED_OUTPATIENT_CLINIC_OR_DEPARTMENT_OTHER)
Admission: RE | Admit: 2024-03-28 | Discharge: 2024-03-28 | Disposition: A | Discharge status: Home / Self Care | Source: Ambulatory Visit | Attending: Family Medicine | Admitting: Family Medicine

## 2024-03-28 ENCOUNTER — Encounter: Payer: Self-pay | Admitting: Family Medicine

## 2024-03-28 ENCOUNTER — Ambulatory Visit (INDEPENDENT_AMBULATORY_CARE_PROVIDER_SITE_OTHER): Admitting: Family Medicine

## 2024-03-28 VITALS — BP 138/81 | HR 90 | Ht 67.5 in | Wt 212.0 lb

## 2024-03-28 DIAGNOSIS — I8002 Phlebitis and thrombophlebitis of superficial vessels of left lower extremity: Secondary | ICD-10-CM | POA: Diagnosis not present

## 2024-03-28 DIAGNOSIS — R2242 Localized swelling, mass and lump, left lower limb: Secondary | ICD-10-CM | POA: Insufficient documentation

## 2024-03-28 DIAGNOSIS — L539 Erythematous condition, unspecified: Secondary | ICD-10-CM | POA: Diagnosis not present

## 2024-03-28 DIAGNOSIS — M79605 Pain in left leg: Secondary | ICD-10-CM

## 2024-03-28 NOTE — Progress Notes (Signed)
   Acute Office Visit  Subjective:     Patient ID: Michaela Dodson, female    DOB: Jan 04, 1971, 53 y.o.   MRN: [id number]  Chief Complaint  Patient presents with   Leg Pain    C/o left thigh pain x  1 week and left calf pain x 2 days - tender to touch and prolonged standing causes throbbing sensation.     HPI Patient is in today for pain in the left inner thigh that started about a week ago she felt a palpable lump.  And then about 2 days ago noticed a palpable lump and tenderness in the left medial calf.  She denies any injury or trauma.  No prolonged sitting such as traveling etc.  She reports that she did have a blood clot maybe in her 20s and was actually on injectable blood thinners for a little while but has not had any problems since then.  Chest pain shortness of breath or palpitations.  ROS      Objective:    BP 138/81   Pulse 90   Ht 5' 7.5" (1.715 m)   Wt 212 lb (96.2 kg)   SpO2 99%   BMI 32.71 kg/m    Physical Exam Vitals reviewed.  Constitutional:      Appearance: Normal appearance.  HENT:     Head: Normocephalic.  Pulmonary:     Effort: Pulmonary effort is normal.  Musculoskeletal:     Comments: Palpable what feels like a little bit more of a round lump on that left inner thigh on the left medial calf.  She has a somewhat linear horizontally swollen area with a thickness of may be around 2 cm that is very tender on exam she also has several varicose veins on both legs.  Neurological:     Mental Status: She is alert and oriented to person, place, and time.  Psychiatric:        Mood and Affect: Mood normal.        Behavior: Behavior normal.     No results found for any visits on 03/28/24.      Assessment & Plan:   Problem List Items Addressed This Visit   None Visit Diagnoses       Left leg pain    -  Primary   Relevant Orders   US Venous Img Lower Unilateral Left     Skin lump of leg, left       Relevant Orders   US Venous Img Lower  Unilateral Left      Concern for possible SVT versus DVT.  Will get Doppler for further evaluation if it is more consistent with SVT will put her on anti-inflammatory and compression until it improves and then add like to get her to a vein specialist to have more definitive treatment.  No orders of the defined types were placed in this encounter.   No follow-ups on file.  Nani Gasser, MD

## 2024-03-29 ENCOUNTER — Encounter: Payer: Self-pay | Admitting: Family Medicine

## 2024-03-29 ENCOUNTER — Other Ambulatory Visit: Payer: Self-pay | Admitting: Family Medicine

## 2024-03-29 DIAGNOSIS — H52223 Regular astigmatism, bilateral: Secondary | ICD-10-CM | POA: Diagnosis not present

## 2024-03-29 DIAGNOSIS — I8312 Varicose veins of left lower extremity with inflammation: Secondary | ICD-10-CM

## 2024-03-29 NOTE — Progress Notes (Signed)
 Hi Neysha, so the good news is is there is no DVT there is with a dangerous kind.  But you do have the inflamed blood vessels with clot that are superficial just meaning that there towards the surface of the skin.  And they do correspond to the areas that feel swollen.  It is definitely the better case scenario.  So the recommendation is to put you on an anti-inflammatory for about 5 to 7 days so would like you to take naproxen 1 tab twice a day every 12 hours.  Make sure to take it with Prilosec or Pepcid or something like that is how protect her stomach while you are taking it.  Ankle compression is super helpful.  I know you mentioned yesterday that you have compression stockings that would be perfect.  The other option would be an Ace wrap if you would prefer to do that or you feel like it is just more comfortable.  I am to go ahead and place a referral to one of our vein specialist.  It will probably be a couple weeks out before you get an appointment which is perfectly fine.  But I want them to address your vein issues.

## 2024-04-11 DIAGNOSIS — M5459 Other low back pain: Secondary | ICD-10-CM | POA: Diagnosis not present

## 2024-04-11 DIAGNOSIS — M47816 Spondylosis without myelopathy or radiculopathy, lumbar region: Secondary | ICD-10-CM | POA: Diagnosis not present

## 2024-04-18 DIAGNOSIS — M79604 Pain in right leg: Secondary | ICD-10-CM | POA: Diagnosis not present

## 2024-04-18 DIAGNOSIS — I87393 Chronic venous hypertension (idiopathic) with other complications of bilateral lower extremity: Secondary | ICD-10-CM | POA: Diagnosis not present

## 2024-04-18 DIAGNOSIS — M79662 Pain in left lower leg: Secondary | ICD-10-CM | POA: Diagnosis not present

## 2024-04-18 DIAGNOSIS — M79605 Pain in left leg: Secondary | ICD-10-CM | POA: Diagnosis not present

## 2024-04-18 DIAGNOSIS — M79661 Pain in right lower leg: Secondary | ICD-10-CM | POA: Diagnosis not present

## 2024-04-25 DIAGNOSIS — M79605 Pain in left leg: Secondary | ICD-10-CM | POA: Diagnosis not present

## 2024-04-25 DIAGNOSIS — M7989 Other specified soft tissue disorders: Secondary | ICD-10-CM | POA: Diagnosis not present

## 2024-05-01 DIAGNOSIS — F3110 Bipolar disorder, current episode manic without psychotic features, unspecified: Secondary | ICD-10-CM | POA: Diagnosis not present

## 2024-05-04 ENCOUNTER — Other Ambulatory Visit: Payer: Self-pay | Admitting: Physician Assistant

## 2024-05-08 DIAGNOSIS — M7989 Other specified soft tissue disorders: Secondary | ICD-10-CM | POA: Diagnosis not present

## 2024-05-08 DIAGNOSIS — I87392 Chronic venous hypertension (idiopathic) with other complications of left lower extremity: Secondary | ICD-10-CM | POA: Diagnosis not present

## 2024-05-08 DIAGNOSIS — I8002 Phlebitis and thrombophlebitis of superficial vessels of left lower extremity: Secondary | ICD-10-CM | POA: Diagnosis not present

## 2024-05-09 DIAGNOSIS — M48061 Spinal stenosis, lumbar region without neurogenic claudication: Secondary | ICD-10-CM | POA: Diagnosis not present

## 2024-05-09 DIAGNOSIS — M47816 Spondylosis without myelopathy or radiculopathy, lumbar region: Secondary | ICD-10-CM | POA: Diagnosis not present

## 2024-05-09 DIAGNOSIS — M5459 Other low back pain: Secondary | ICD-10-CM | POA: Diagnosis not present

## 2024-05-15 DIAGNOSIS — N3946 Mixed incontinence: Secondary | ICD-10-CM | POA: Diagnosis not present

## 2024-05-15 DIAGNOSIS — N3000 Acute cystitis without hematuria: Secondary | ICD-10-CM | POA: Diagnosis not present

## 2024-05-30 DIAGNOSIS — R600 Localized edema: Secondary | ICD-10-CM | POA: Diagnosis not present

## 2024-05-30 DIAGNOSIS — R252 Cramp and spasm: Secondary | ICD-10-CM | POA: Diagnosis not present

## 2024-05-30 DIAGNOSIS — I83892 Varicose veins of left lower extremities with other complications: Secondary | ICD-10-CM | POA: Diagnosis not present

## 2024-05-30 DIAGNOSIS — I87392 Chronic venous hypertension (idiopathic) with other complications of left lower extremity: Secondary | ICD-10-CM | POA: Diagnosis not present

## 2024-05-30 DIAGNOSIS — N3000 Acute cystitis without hematuria: Secondary | ICD-10-CM | POA: Diagnosis not present

## 2024-06-01 DIAGNOSIS — M47816 Spondylosis without myelopathy or radiculopathy, lumbar region: Secondary | ICD-10-CM | POA: Diagnosis not present

## 2024-06-01 DIAGNOSIS — M48061 Spinal stenosis, lumbar region without neurogenic claudication: Secondary | ICD-10-CM | POA: Diagnosis not present

## 2024-06-09 ENCOUNTER — Encounter: Payer: Self-pay | Admitting: Family Medicine

## 2024-06-09 DIAGNOSIS — N390 Urinary tract infection, site not specified: Secondary | ICD-10-CM

## 2024-06-21 DIAGNOSIS — I83892 Varicose veins of left lower extremities with other complications: Secondary | ICD-10-CM | POA: Diagnosis not present

## 2024-06-26 ENCOUNTER — Ambulatory Visit

## 2024-06-26 DIAGNOSIS — N390 Urinary tract infection, site not specified: Secondary | ICD-10-CM | POA: Diagnosis not present

## 2024-06-29 ENCOUNTER — Ambulatory Visit (INDEPENDENT_AMBULATORY_CARE_PROVIDER_SITE_OTHER): Admitting: Family Medicine

## 2024-06-29 VITALS — BP 118/70 | HR 103 | Temp 99.0°F | Ht 68.0 in | Wt 213.1 lb

## 2024-06-29 DIAGNOSIS — N390 Urinary tract infection, site not specified: Secondary | ICD-10-CM

## 2024-06-29 LAB — POCT URINALYSIS DIP (CLINITEK)
Bilirubin, UA: NEGATIVE
Glucose, UA: NEGATIVE mg/dL
Ketones, POC UA: NEGATIVE mg/dL
Leukocytes, UA: NEGATIVE
Nitrite, UA: POSITIVE — AB
POC PROTEIN,UA: NEGATIVE
Spec Grav, UA: 1.02 (ref 1.010–1.025)
Urobilinogen, UA: 0.2 U/dL
pH, UA: 6 (ref 5.0–8.0)

## 2024-06-29 MED ORDER — NITROFURANTOIN MONOHYD MACRO 100 MG PO CAPS
100.0000 mg | ORAL_CAPSULE | Freq: Two times a day (BID) | ORAL | 0 refills | Status: DC
Start: 2024-06-29 — End: 2024-10-04

## 2024-06-29 NOTE — Progress Notes (Signed)
 UA is pso for nitrites.  Will tx. call if not better in 1 week.  Meds ordered this encounter  Medications   nitrofurantoin , macrocrystal-monohydrate, (MACROBID ) 100 MG capsule    Sig: Take 1 capsule (100 mg total) by mouth 2 (two) times daily.    Dispense:  10 capsule    Refill:  0

## 2024-06-29 NOTE — Addendum Note (Signed)
 Addended by: Daylan Juhnke D on: 06/29/2024 04:52 PM   Modules accepted: Orders

## 2024-06-29 NOTE — Progress Notes (Signed)
 Patient is here for UTI. Patient reports no recent antibiotic use or recent catheterization. Patient has taken Azo. Denies flank pain, pelvic pain, fever, chills or sweats.    Urine culture collected and sent to [facility].

## 2024-07-03 ENCOUNTER — Ambulatory Visit: Payer: Self-pay | Admitting: Family Medicine

## 2024-07-03 DIAGNOSIS — I83892 Varicose veins of left lower extremities with other complications: Secondary | ICD-10-CM | POA: Diagnosis not present

## 2024-07-03 DIAGNOSIS — Z09 Encounter for follow-up examination after completed treatment for conditions other than malignant neoplasm: Secondary | ICD-10-CM | POA: Diagnosis not present

## 2024-07-03 NOTE — Telephone Encounter (Signed)
 Please send her ultrasound results to Dr. Norleen Sharps in [place], urology

## 2024-07-03 NOTE — Telephone Encounter (Signed)
 Results sent to Dr Claudene.

## 2024-07-03 NOTE — Progress Notes (Signed)
 Hi Michaela Dodson, there is little bit of mild swelling in both kidneys called pelviectasis.  I think they had noted this back in 2019 as well.  I cannot remember if you had seen a urologist about this before?  Looks like you have a small cluster of blood vessels called a hemangioma in the right lobe of the liver.  This is not concerning or worrisome it something you were probably born with.

## 2024-07-04 ENCOUNTER — Ambulatory Visit: Payer: Self-pay | Admitting: Family Medicine

## 2024-07-04 LAB — URINE CULTURE

## 2024-07-04 NOTE — Progress Notes (Signed)
 Hi Lagina, final urine culture came back positive for E. coli.  The great thing is its pansensitive.  So the antibiotic that we chose should be correct based on the sensitivities.  Fully you are feeling much better.

## 2024-07-24 DIAGNOSIS — M5459 Other low back pain: Secondary | ICD-10-CM | POA: Diagnosis not present

## 2024-07-24 DIAGNOSIS — M47816 Spondylosis without myelopathy or radiculopathy, lumbar region: Secondary | ICD-10-CM | POA: Diagnosis not present

## 2024-07-24 DIAGNOSIS — M48061 Spinal stenosis, lumbar region without neurogenic claudication: Secondary | ICD-10-CM | POA: Diagnosis not present

## 2024-07-24 DIAGNOSIS — G894 Chronic pain syndrome: Secondary | ICD-10-CM | POA: Diagnosis not present

## 2024-07-25 DIAGNOSIS — N390 Urinary tract infection, site not specified: Secondary | ICD-10-CM | POA: Diagnosis not present

## 2024-07-25 DIAGNOSIS — R399 Unspecified symptoms and signs involving the genitourinary system: Secondary | ICD-10-CM | POA: Diagnosis not present

## 2024-07-25 DIAGNOSIS — N3281 Overactive bladder: Secondary | ICD-10-CM | POA: Diagnosis not present

## 2024-07-27 ENCOUNTER — Other Ambulatory Visit: Payer: Self-pay | Admitting: Family Medicine

## 2024-07-31 DIAGNOSIS — F3110 Bipolar disorder, current episode manic without psychotic features, unspecified: Secondary | ICD-10-CM | POA: Diagnosis not present

## 2024-08-01 DIAGNOSIS — K912 Postsurgical malabsorption, not elsewhere classified: Secondary | ICD-10-CM | POA: Diagnosis not present

## 2024-08-01 DIAGNOSIS — E7800 Pure hypercholesterolemia, unspecified: Secondary | ICD-10-CM | POA: Diagnosis not present

## 2024-08-01 DIAGNOSIS — Z131 Encounter for screening for diabetes mellitus: Secondary | ICD-10-CM | POA: Diagnosis not present

## 2024-08-01 DIAGNOSIS — E66811 Obesity, class 1: Secondary | ICD-10-CM | POA: Diagnosis not present

## 2024-08-14 DIAGNOSIS — K080 Exfoliation of teeth due to systemic causes: Secondary | ICD-10-CM | POA: Diagnosis not present

## 2024-08-15 ENCOUNTER — Other Ambulatory Visit: Payer: Self-pay | Admitting: Sports Medicine

## 2024-08-15 DIAGNOSIS — F3132 Bipolar disorder, current episode depressed, moderate: Secondary | ICD-10-CM

## 2024-08-18 DIAGNOSIS — G589 Mononeuropathy, unspecified: Secondary | ICD-10-CM | POA: Diagnosis not present

## 2024-08-18 DIAGNOSIS — J45909 Unspecified asthma, uncomplicated: Secondary | ICD-10-CM | POA: Diagnosis not present

## 2024-08-18 DIAGNOSIS — G894 Chronic pain syndrome: Secondary | ICD-10-CM | POA: Diagnosis not present

## 2024-08-18 DIAGNOSIS — M47816 Spondylosis without myelopathy or radiculopathy, lumbar region: Secondary | ICD-10-CM | POA: Diagnosis not present

## 2024-08-18 DIAGNOSIS — E669 Obesity, unspecified: Secondary | ICD-10-CM | POA: Diagnosis not present

## 2024-08-18 DIAGNOSIS — K219 Gastro-esophageal reflux disease without esophagitis: Secondary | ICD-10-CM | POA: Diagnosis not present

## 2024-08-18 DIAGNOSIS — M48061 Spinal stenosis, lumbar region without neurogenic claudication: Secondary | ICD-10-CM | POA: Diagnosis not present

## 2024-08-29 ENCOUNTER — Encounter: Payer: Self-pay | Admitting: Sports Medicine

## 2024-09-01 DIAGNOSIS — M47816 Spondylosis without myelopathy or radiculopathy, lumbar region: Secondary | ICD-10-CM | POA: Diagnosis not present

## 2024-09-01 DIAGNOSIS — G894 Chronic pain syndrome: Secondary | ICD-10-CM | POA: Diagnosis not present

## 2024-09-01 DIAGNOSIS — M5459 Other low back pain: Secondary | ICD-10-CM | POA: Diagnosis not present

## 2024-09-18 DIAGNOSIS — E7800 Pure hypercholesterolemia, unspecified: Secondary | ICD-10-CM | POA: Diagnosis not present

## 2024-09-18 DIAGNOSIS — K912 Postsurgical malabsorption, not elsewhere classified: Secondary | ICD-10-CM | POA: Diagnosis not present

## 2024-09-18 DIAGNOSIS — E66811 Obesity, class 1: Secondary | ICD-10-CM | POA: Diagnosis not present

## 2024-09-18 DIAGNOSIS — Z903 Acquired absence of stomach [part of]: Secondary | ICD-10-CM | POA: Diagnosis not present

## 2024-09-19 DIAGNOSIS — I83892 Varicose veins of left lower extremities with other complications: Secondary | ICD-10-CM | POA: Diagnosis not present

## 2024-09-19 DIAGNOSIS — I872 Venous insufficiency (chronic) (peripheral): Secondary | ICD-10-CM | POA: Diagnosis not present

## 2024-09-19 DIAGNOSIS — M7989 Other specified soft tissue disorders: Secondary | ICD-10-CM | POA: Diagnosis not present

## 2024-09-19 DIAGNOSIS — R600 Localized edema: Secondary | ICD-10-CM | POA: Diagnosis not present

## 2024-09-19 DIAGNOSIS — R252 Cramp and spasm: Secondary | ICD-10-CM | POA: Diagnosis not present

## 2024-10-03 DIAGNOSIS — I83892 Varicose veins of left lower extremities with other complications: Secondary | ICD-10-CM | POA: Diagnosis not present

## 2024-10-04 ENCOUNTER — Ambulatory Visit: Payer: Medicare Other

## 2024-10-04 VITALS — Ht 67.5 in | Wt 212.0 lb

## 2024-10-04 DIAGNOSIS — Z0000 Encounter for general adult medical examination without abnormal findings: Secondary | ICD-10-CM | POA: Diagnosis not present

## 2024-10-04 DIAGNOSIS — Z122 Encounter for screening for malignant neoplasm of respiratory organs: Secondary | ICD-10-CM

## 2024-10-04 NOTE — Progress Notes (Signed)
 Subjective:   Michaela Dodson is a 53 y.o. female who presents for Medicare Annual (Subsequent) preventive examination.  Visit Complete: Virtual I connected with  [name] on 10/04/24 by a audio enabled telemedicine application and verified that I am speaking with the correct person using two identifiers.  Patient Location: Home  Provider Location: Office/Clinic  I discussed the limitations of evaluation and management by telemedicine. The patient expressed understanding and agreed to proceed.  Vital Signs: Because this visit was a virtual/telehealth visit, some criteria may be missing or patient reported. Any vitals not documented were not able to be obtained and vitals that have been documented are patient reported.  Patient Medicare AWV questionnaire was completed by the patient on n/a; I have confirmed that all information answered by patient is correct and no changes since this date.  Cardiac Risk Factors include: smoking/ tobacco exposure;obesity (BMI >30kg/m2);sedentary lifestyle     Objective:    Today's Vitals   10/04/24 0802  Weight: 212 lb (96.2 kg)  Height: 5' 7.5 (1.715 m)  PainSc: 4    Body mass index is 32.71 kg/m.     10/04/2024    8:13 AM 10/04/2023    8:09 AM 09/28/2022    8:06 AM 11/26/2021    8:33 AM 09/20/2019    8:49 AM  Advanced Directives  Does Patient Have a Medical Advance Directive? No Yes Yes Yes No  Type of Advance Directive  Living will Living will    Does patient want to make changes to medical advance directive?  No - Patient declined No - Patient declined    Would patient like information on creating a medical advance directive? No - Patient declined    No - Patient declined    Current Medications (verified) Outpatient Encounter Medications as of 10/04/2024  Medication Sig   aspirin EC 81 MG tablet Take 81 mg by mouth daily. Swallow whole.   buPROPion  (WELLBUTRIN  XL) 150 MG 24 hr tablet Take 150 mg by mouth every morning.    Calcium  Carbonate (CALCIUM  500 PO) Take by mouth.   DULoxetine  (CYMBALTA ) 60 MG capsule Take 1 capsule by mouth once daily   esomeprazole  (NEXIUM ) 20 MG capsule Take 1 capsule by mouth once daily in the morning   risperiDONE (RISPERDAL) 3 MG tablet Take 1.5 tablets by mouth daily. 1.5 mg daily   trospium (SANCTURA) 20 MG tablet Take 20 mg by mouth daily.   Vitamin D , Ergocalciferol , (DRISDOL) 1.25 MG (50000 UNIT) CAPS capsule Take by mouth once a week.   [DISCONTINUED] nitrofurantoin , macrocrystal-monohydrate, (MACROBID ) 100 MG capsule Take 1 capsule (100 mg total) by mouth 2 (two) times daily.   No facility-administered encounter medications on file as of 10/04/2024.    Allergies (verified) Patient has no known allergies.   History: Past Medical History:  Diagnosis Date   Allergy    Depression    GERD (gastroesophageal reflux disease)    Pt states that this has gone away since her recent weight loss   H/O thromboembolism    superficial-left medial thigh lipoma, vericose veins   Obesity    Pt has lost the weight and is no longer obese   Past Surgical History:  Procedure Laterality Date   BREAST BIOPSY     CARPAL TUNNEL RELEASE Right 09/2012   Dr. Ahmad CARRY TUNNEL RELEASE Left 11/2013   Dr. Ahmad   TUBAL LIGATION     uterine ablation     Family History  Problem  Relation Age of Onset   Breast cancer Mother    Bladder Cancer Mother    Colon cancer Maternal Grandmother    Social History   Socioeconomic History   Marital status: Married    Spouse name: Lynwood   Number of children: 3   Years of education: 12   Highest education level: 12th grade  Occupational History   Occupation: Disbability  Tobacco Use   Smoking status: Former    Current packs/day: 0.00    Average packs/day: 1 pack/day for 29.1 years (29.1 ttl pk-yrs)    Types: Cigarettes    Start date: 05/28/1986    Quit date: 07/18/2015    Years since quitting: 9.2   Smokeless tobacco: Never   Tobacco  comments:    e-cigs  Vaping Use   Vaping status: Every Day   Substances: Nicotine  Substance and Sexual Activity   Alcohol use: Not Currently   Drug use: No   Sexual activity: Yes    Birth control/protection: None  Other Topics Concern   Not on file  Social History Narrative   Lives with her husband and her son. She enjoys spending time with grandchildren.   Social Drivers of Corporate investment banker Strain: Low Risk  (10/04/2024)   Overall Financial Resource Strain (CARDIA)    Difficulty of Paying Living Expenses: Not hard at all  Food Insecurity: No Food Insecurity (10/04/2024)   Hunger Vital Sign    Worried About Running Out of Food in the Last Year: Never true    Ran Out of Food in the Last Year: Never true  Transportation Needs: No Transportation Needs (10/04/2024)   PRAPARE - Administrator, Civil Service (Medical): No    Lack of Transportation (Non-Medical): No  Physical Activity: Inactive (10/04/2024)   Exercise Vital Sign    Days of Exercise per Week: 0 days    Minutes of Exercise per Session: 0 min  Stress: Stress Concern Present (10/04/2024)   [employer] of Occupational Health - Occupational Stress Questionnaire    Feeling of Stress: To some extent  Social Connections: Moderately Isolated (10/04/2024)   Social Connection and Isolation Panel    Frequency of Communication with Friends and Family: More than three times a week    Frequency of Social Gatherings with Friends and Family: More than three times a week    Attends Religious Services: Never    Database administrator or Organizations: No    Attends Engineer, structural: Never    Marital Status: Married    Tobacco Counseling Counseling given: Not Answered Tobacco comments: e-cigs   Clinical Intake:  Pre-visit preparation completed: Yes  Pain : 0-10 Pain Score: 4  Pain Type: Chronic pain Pain Location: Back Pain Onset: More than a month ago Pain Frequency: Constant      BMI - recorded: 32.71 Nutritional Status: BMI > 30  Obese Nutritional Risks: None Diabetes: No  How often do you need to have someone help you when you read instructions, pamphlets, or other written materials from your doctor or pharmacy?: 1 - Never What is the last grade level you completed in school?: 12  Interpreter Needed?: No      Activities of Daily Living    10/04/2024    8:03 AM  In your present state of health, do you have any difficulty performing the following activities:  Hearing? 0  Vision? 0  Difficulty concentrating or making decisions? 1  Walking or climbing stairs? 1  Dressing or bathing? 0  Doing errands, shopping? 0  Preparing Food and eating ? N  Using the Toilet? N  In the past six months, have you accidently leaked urine? Y  Do you have problems with loss of bowel control? N  Managing your Medications? N  Managing your Finances? N  Housekeeping or managing your Housekeeping? N    Patient Care Team: Alvan Dorothyann BIRCH, MD as PCP - General (Family Medicine) Claudene Norleen MOULD, MD as Referring Physician (Urology) Rosella Lenis, MD as Referring Physician (Pain Medicine) Joesph Railing, NP as Nurse Practitioner (Behavioral Health)  Indicate any recent Medical Services you may have received from other than [facility] providers in the past year (date may be approximate).     Assessment:   This is a routine wellness examination for [place].  Hearing/Vision screen No results found.   Goals Addressed             This Visit's Progress    Patient Stated       Patient states she would like to lose 25 lbs.        Depression Screen    10/04/2024    8:11 AM 03/28/2024    4:24 PM 10/04/2023    8:10 AM 09/28/2022    8:07 AM 02/02/2022   10:25 AM 09/18/2019    1:30 PM 12/13/2018    4:11 PM  PHQ 2/9 Scores  PHQ - 2 Score 2 2 2  0 0 5 2  PHQ- 9 Score  10 9 0  21 13    Fall Risk    10/04/2024    8:13 AM 03/28/2024    4:25 PM 03/28/2024    3:53 PM  10/04/2023    8:09 AM 03/16/2023   11:17 AM  Fall Risk   Falls in the past year? 0 0 0 0 0  Number falls in past yr: 0 0 0 0 0  Injury with Fall? 0 0 0 0 0  Risk for fall due to : No Fall Risks No Fall Risks No Fall Risks No Fall Risks No Fall Risks  Follow up Falls evaluation completed Falls evaluation completed Falls evaluation completed Falls evaluation completed Falls evaluation completed    MEDICARE RISK AT HOME: Medicare Risk at Home Any stairs in or around the home?: Yes If so, are there any without handrails?: Yes Home free of loose throw rugs in walkways, pet beds, electrical cords, etc?: Yes Adequate lighting in your home to reduce risk of falls?: Yes Life alert?: No Use of a cane, walker or w/c?: No Grab bars in the bathroom?: No Shower chair or bench in shower?: No Elevated toilet seat or a handicapped toilet?: No  TIMED UP AND GO:  Was the test performed?  No    Cognitive Function:        10/04/2024    8:14 AM 10/04/2023    8:20 AM 09/28/2022    8:12 AM  6CIT Screen  What Year? 0 points 0 points 0 points  What month? 0 points 0 points 0 points  What time? 0 points 0 points 0 points  Count back from 20 0 points 0 points 0 points  Months in reverse 0 points 0 points 0 points  Repeat phrase 0 points 0 points 0 points  Total Score 0 points 0 points 0 points    Immunizations Immunization History  Administered Date(s) Administered   Influenza Whole 09/24/2008   Influenza-Unspecified 09/08/2017   Pneumococcal Polysaccharide-23 04/04/2012   Td 12/28/2005  Tdap 06/07/2015, 09/18/2019    TDAP status: Up to date  Flu Vaccine status: Due, Education has been provided regarding the importance of this vaccine. Advised may receive this vaccine at local pharmacy or Health Dept. Aware to provide a copy of the vaccination record if obtained from local pharmacy or Health Dept. Verbalized acceptance and understanding.  Pneumococcal vaccine status: Due, Education has  been provided regarding the importance of this vaccine. Advised may receive this vaccine at local pharmacy or Health Dept. Aware to provide a copy of the vaccination record if obtained from local pharmacy or Health Dept. Verbalized acceptance and understanding.  Covid-19 vaccine status: Declined, Education has been provided regarding the importance of this vaccine but patient still declined. Advised may receive this vaccine at local pharmacy or Health Dept.or vaccine clinic. Aware to provide a copy of the vaccination record if obtained from local pharmacy or Health Dept. Verbalized acceptance and understanding.  Qualifies for Shingles Vaccine? Yes   Zostavax completed No   Shingrix Completed?: No.    Education has been provided regarding the importance of this vaccine. Patient has been advised to call insurance company to determine out of pocket expense if they have not yet received this vaccine. Advised may also receive vaccine at local pharmacy or Health Dept. Verbalized acceptance and understanding.  Screening Tests Health Maintenance  Topic Date Due   Hepatitis B Vaccines 19-59 Average Risk (1 of 3 - 19+ 3-dose series) Never done   Pneumococcal Vaccine: 50+ Years (2 of 2 - PCV) 04/04/2013   Lung Cancer Screening  Never done   Zoster Vaccines- Shingrix (1 of 2) Never done   Influenza Vaccine  07/28/2024   COVID-19 Vaccine (1 - 2024-25 season) Never done   Medicare Annual Wellness (AWV)  10/04/2025   Mammogram  10/21/2025   Cervical Cancer Screening (HPV/Pap Cotest)  03/15/2028   DTaP/Tdap/Td (4 - Td or Tdap) 09/17/2029   Colonoscopy  02/24/2033   Hepatitis C Screening  Completed   HIV Screening  Completed   HPV VACCINES  Aged Out   Meningococcal B Vaccine  Aged Out    Health Maintenance  Health Maintenance Due  Topic Date Due   Hepatitis B Vaccines 19-59 Average Risk (1 of 3 - 19+ 3-dose series) Never done   Pneumococcal Vaccine: 50+ Years (2 of 2 - PCV) 04/04/2013   Lung Cancer  Screening  Never done   Zoster Vaccines- Shingrix (1 of 2) Never done   Influenza Vaccine  07/28/2024   COVID-19 Vaccine (1 - 2024-25 season) Never done    Colorectal cancer screening: Type of screening: Colonoscopy. Completed 02/24/2023. Repeat every 10 years  Mammogram status: Completed 10/22/2023. Repeat every year   Lung Cancer Screening: (Low Dose CT Chest recommended if Age 7-80 years, 20 pack-year currently smoking OR have quit w/in 15years.) does qualify.   Lung Cancer Screening Referral: yes  Additional Screening:  Hepatitis C Screening: does qualify; Completed 03/20/2021  Vision Screening: Recommended annual ophthalmology exams for early detection of glaucoma and other disorders of the eye. Is the patient up to date with their annual eye exam?  Yes  Who is the provider or what is the name of the office in which the patient attends annual eye exams? Dr Arloa If pt is not established with a provider, would they like to be referred to a provider to establish care? N/a.   Dental Screening: Recommended annual dental exams for proper oral hygiene   Community Resource Referral / Chronic Care  Management: CRR required this visit?  Yes   CCM required this visit?  No     Plan:     I have personally reviewed and noted the following in the patient's chart:   Medical and social history Use of alcohol, tobacco or illicit drugs  Current medications and supplements including opioid prescriptions. Patient is not currently taking opioid prescriptions. Functional ability and status Nutritional status Physical activity Advanced directives List of other physicians Hospitalizations, surgeries, and ER visits in previous 12 months. None Vitals Screenings to include cognitive, depression, and falls Referrals and appointments  In addition, I have reviewed and discussed with patient certain preventive protocols, quality metrics, and best practice recommendations. A written  personalized care plan for preventive services as well as general preventive health recommendations were provided to patient.     Bonny Jon Mayor, CMA   10/04/2024   After Visit Summary: (MyChart) Due to this being a telephonic visit, the after visit summary with patients personalized plan was offered to patient via MyChart   Nurse Notes:   RAYVON BRANDVOLD is a 53 y.o. female patient of Metheney, Dorothyann BIRCH, MD who had a Medicare Annual Wellness Visit today via telephone. Lulu is Disabled and lives with their family. She has 3 children. She reports that she is socially active and does interact with friends/family regularly. She is moderately physically active and enjoys spending time with grandchildren.    Lung cancer screening ordered.

## 2024-10-04 NOTE — Patient Instructions (Signed)
 Michaela Dodson , Thank you for taking time to come for your Medicare Wellness Visit. I appreciate your ongoing commitment to your health goals. Please review the following plan we discussed and let me know if I can assist you in the future.   These are the goals we discussed:  Goals       Patient Stated (pt-stated)      09/28/2022 AWV Goal: Exercise for General Health  Patient will verbalize understanding of the benefits of increased physical activity: Exercising regularly is important. It will improve your overall fitness, flexibility, and endurance. Regular exercise also will improve your overall health. It can help you control your weight, reduce stress, and improve your bone density. Over the next year, patient will increase physical activity as tolerated with a goal of at least 150 minutes of moderate physical activity per week.  You can tell that you are exercising at a moderate intensity if your heart starts beating faster and you start breathing faster but can still hold a conversation. Moderate-intensity exercise ideas include: Walking 1 mile (1.6 km) in about 15 minutes Biking Hiking Golfing Dancing Water aerobics Patient will verbalize understanding of everyday activities that increase physical activity by providing examples like the following: Yard work, such as: Insurance underwriter Gardening Washing windows or floors Patient will be able to explain general safety guidelines for exercising:  Before you start a new exercise program, talk with your health care provider. Do not exercise so much that you hurt yourself, feel dizzy, or get very short of breath. Wear comfortable clothes and wear shoes with good support. Drink plenty of water while you exercise to prevent dehydration or heat stroke. Work out until your breathing and your heartbeat get faster.       Patient Stated (pt-stated)       10/04/2023 AWV Goal: Exercise for General Health  Patient will verbalize understanding of the benefits of increased physical activity: Exercising regularly is important. It will improve your overall fitness, flexibility, and endurance. Regular exercise also will improve your overall health. It can help you control your weight, reduce stress, and improve your bone density. Over the next year, patient will increase physical activity as tolerated with a goal of at least 150 minutes of moderate physical activity per week.  You can tell that you are exercising at a moderate intensity if your heart starts beating faster and you start breathing faster but can still hold a conversation. Moderate-intensity exercise ideas include: Walking 1 mile (1.6 km) in about 15 minutes Biking Hiking Golfing Dancing Water aerobics Patient will verbalize understanding of everyday activities that increase physical activity by providing examples like the following: Yard work, such as: Insurance underwriter Gardening Washing windows or floors Patient will be able to explain general safety guidelines for exercising:  Before you start a new exercise program, talk with your health care provider. Do not exercise so much that you hurt yourself, feel dizzy, or get very short of breath. Wear comfortable clothes and wear shoes with good support. Drink plenty of water while you exercise to prevent dehydration or heat stroke. Work out until your breathing and your heartbeat get faster.       Patient Stated      Patient states she would like to lose 25 lbs.         This is  a list of the screening recommended for you and due dates:  Health Maintenance  Topic Date Due   Hepatitis B Vaccine (1 of 3 - 19+ 3-dose series) Never done   Pneumococcal Vaccine for age over 60 (2 of 2 - PCV) 04/04/2013   Screening for Lung Cancer  Never done    Zoster (Shingles) Vaccine (1 of 2) Never done   Flu Shot  07/28/2024   COVID-19 Vaccine (1 - 2024-25 season) Never done   Medicare Annual Wellness Visit  10/04/2025   Breast Cancer Screening  10/21/2025   Pap with HPV screening  03/15/2028   DTaP/Tdap/Td vaccine (4 - Td or Tdap) 09/17/2029   Colon Cancer Screening  02/24/2033   Hepatitis C Screening  Completed   HIV Screening  Completed   HPV Vaccine  Aged Out   Meningitis B Vaccine  Aged Out

## 2024-10-11 ENCOUNTER — Other Ambulatory Visit: Payer: Self-pay

## 2024-10-11 ENCOUNTER — Telehealth: Payer: Self-pay | Admitting: Acute Care

## 2024-10-11 DIAGNOSIS — Z87891 Personal history of nicotine dependence: Secondary | ICD-10-CM

## 2024-10-11 DIAGNOSIS — Z122 Encounter for screening for malignant neoplasm of respiratory organs: Secondary | ICD-10-CM

## 2024-10-11 NOTE — Telephone Encounter (Signed)
 Lung Cancer Screening Narrative/Criteria Questionnaire (Cigarette Smokers Only- No Cigars/Pipes/vapes)   Michaela Dodson   SDMV:10/19/24 at 0930a/Natalie                                           Oct 21, 1971               LDCT: 11/02/24 at 0930a/MKV    53 y.o.   Phone: [phone]  Lung Screening Narrative (confirm age 40-77 yrs Medicare / 50-80 yrs Private pay insurance)   Insurance information:BCBS   Referring Provider:Metheney   This screening involves an initial phone call with a team member from our program. It is called a shared decision making visit. The initial meeting is required by insurance and Medicare to make sure you understand the program. This appointment takes about 15-20 minutes to complete. The CT scan will completed at a separate date/time. This scan takes about 5-10 minutes to complete and you may eat and drink before and after the scan.  Criteria questions for Lung Cancer Screening:   Are you a current or former smoker? Former Age began smoking: 15y   If you are a former smoker, what year did you quit smoking? 2016   To calculate your smoking history, I need an accurate estimate of how many packs of cigarettes you smoked per day and for how many years. (Not just the number of PPD you are now smoking)   Years smoking 29 x Packs per day 1 = Pack years 29   (at least 20 pack yrs)   (Make sure they understand that we need to know how much they have smoked in the past, not just the number of PPD they are smoking now)  Do you have a personal history of cancer?  No    Do you have a family history of cancer? Yes  (cancer type and and relative) mother/breast & bladder,  mat GMA/ colon and Pat GMA/ pancreatic  Are you coughing up blood?  No  Have you had unexplained weight loss of 15 lbs or more in the last 6 months? No  It looks like you meet all criteria.     Additional information: N/A

## 2024-10-19 ENCOUNTER — Ambulatory Visit: Admitting: *Deleted

## 2024-10-19 ENCOUNTER — Encounter: Payer: Self-pay | Admitting: *Deleted

## 2024-10-19 DIAGNOSIS — Z87891 Personal history of nicotine dependence: Secondary | ICD-10-CM | POA: Diagnosis not present

## 2024-10-19 NOTE — Patient Instructions (Signed)

## 2024-10-19 NOTE — Progress Notes (Signed)
  Virtual Visit via Telephone Note  I connected with Michaela Dodson on 10/19/24 at  9:30 AM EDT by telephone and verified that I am speaking with the correct person using two identifiers.  Location: Patient: at home Provider: [address], [place], [place], [address]    I discussed the limitations, risks, security and privacy concerns of performing an evaluation and management service by telephone and the availability of in person appointments. I also discussed with the patient that there may be a patient responsible charge related to this service. The patient expressed understanding and agreed to proceed.   Shared Decision Making Visit Lung Cancer Screening Program [phone])   Eligibility: Age 53 y.o. Pack Years Smoking History Calculation 29 (# packs/per year x # years smoked) Recent History of coughing up blood  no Unexplained weight loss? no ( >Than 15 pounds within the last 6 months ) Prior History Lung / other cancer no (Diagnosis within the last 5 years already requiring surveillance chest CT Scans). Smoking Status Former Smoker Former Smokers: Years since quit: 9 years  Quit Date: 07/18/2015  Visit Components: Discussion included one or more decision making aids. yes Discussion included risk/benefits of screening. yes Discussion included potential follow up diagnostic testing for abnormal scans. yes Discussion included meaning and risk of over diagnosis. yes Discussion included meaning and risk of False Positives. yes Discussion included meaning of total radiation exposure. yes  Counseling Included: Importance of adherence to annual lung cancer LDCT screening. yes Impact of comorbidities on ability to participate in the program. yes Ability and willingness to under diagnostic treatment. yes  Smoking Cessation Counseling: Current Smokers:  Discussed importance of smoking cessation. yes Information about tobacco cessation classes and interventions provided to  patient. yes Patient provided with ticket for LDCT Scan. no Symptomatic Patient. no   Diagnosis Code: Tobacco Use Z72.0 Asymptomatic Patient yes   Counseled patient 4 minutes regarding tobacco use.   Former Smokers:  Discussed the importance of maintaining cigarette abstinence. yes Diagnosis Code: Personal History of Nicotine Dependence. S12.108 Information about tobacco cessation classes and interventions provided to patient. Yes Patient provided with ticket for LDCT Scan. no Written Order for Lung Cancer Screening with LDCT placed in Epic. Yes (CT Chest Lung Cancer Screening Low Dose W/O CM) [id number] Z12.2-Screening of respiratory organs Z87.891-Personal history of nicotine dependence   Laneta Speaks, RN

## 2024-10-23 DIAGNOSIS — F3110 Bipolar disorder, current episode manic without psychotic features, unspecified: Secondary | ICD-10-CM | POA: Diagnosis not present

## 2024-10-23 DIAGNOSIS — M47816 Spondylosis without myelopathy or radiculopathy, lumbar region: Secondary | ICD-10-CM | POA: Diagnosis not present

## 2024-10-23 DIAGNOSIS — M5459 Other low back pain: Secondary | ICD-10-CM | POA: Diagnosis not present

## 2024-10-23 NOTE — Progress Notes (Signed)
 Chief Complaint:  Chief Complaint  Patient presents with  . Follow-up     Assessment: 1. Spondylosis of lumbar spine      2. Lumbar facet joint pain         Plan:   PNS leads pulls bilaterally.   1.  UDS: n/a  2. Injections: none  3. Adjuvant Medications: did not want to continue Lyrica 25 mg twice a day. stop robaxin, may continue tizanidine.  Discussed trialing PT and dry needling. No NSAIDs 2/2 history of gastric sleeve.    4. Opioid medications: Patient is not a candidate for opioids secondary to the use of marijuana  5. Follow up: Follow up if symptoms worsen or fail to improve.  6. The patient's blood pressure was 121/74   Patient's pain was assessed, documented as positive, unless stated in the HPI, and a follow up plan has been documented in the Plan. Patient's medication list was reviewed and updated if applicable.  Body Mass Index (BMI) screening was documented and if the patient's BMI was greater than 25 or less than 18.5, the patient was asked to follow up with their PCP for a full assessment regarding weight management. If Fluoroscopy was used during a procedure, the radiation exposure time and number of images were documented within the record.    History of Present Illness:  Michaela Dodson is a 53 y.o.year old Caucasian female with a history of Chronic low back pain.    Interval history 10/23/2024   Michaela Dodson returns to the clinic today for PNS lead pull. Was inserted  on 08/18/2024 for PNS insertion.   She returns today with pain 5 out of 10, remains in the left lower back. She notes 0% relief with the system.   She denies any radicular symptoms down bilateral lower extremities, no bowel bladder incontinence.  No saddle anesthesia.     Improvement from treatments: see under procedures Side effects from medicines: None Activity Level- _adequate [employer] Behavior- _nil  Procedures: 05/28/2021 bilateral L3-4 L4-5 RFA 02/08/23 - right L4-5, L4-S1  RFA 03/08/23 - left L4-5, L5-S2 RFA 10/06/23 - left L5-S1 RFA - 75-80% 04/11/24-left L4-5 L5-S1 RFA - 0% 06/01/2024 -left L4-5 TFESI -0% relief  Images:  Results for orders placed during the hospital encounter of 03/16/24  MRI Spine Lumbar WO Contrast  Narrative MRI lumbar spine:  INDICATION: Low back pain  TECHNIQUE: Sagittal and axial T1 and T2-weighted sequences were performed. Additional sagittal STIR images were performed.  COMPARISON: CT abdomen pelvis 11/01/2019. Plain film lumbar spine 12/31/2014.  FINDINGS: Motion artifact noted.  For the purposes of this report, it is assumed that the most caudal fully-segmented lumbar-type vertebra is L5, and that the most caudal fully-developed intervertebral disc is L5-S1.  #  Impression Vertebral bodies: The vertebral body heights are preserved. #  Alignment: Dextrocurvature of the lumbar spine. #  Marrow signal: Type I and type II endplate changes at L4-5. Type II endplate changes at L3-L4. Bony hemangioma at L1. #  Conus medullaris: Terminates at L1 with no acute abnormality. #  Visualized lower thoracic segments: No significant stenosis. #  No acute paraspinal soft tissue abnormality.  #  L1-2: No significant stenosis. #  L2-3: Mild facet degenerative change. No significant stenosis. #  L3-4: Mild facet degenerative change. Loss of disc space height with diffuse disc bulge. Mild central canal stenosis. Mild bilateral foraminal stenosis. #  L4-5: Mild facet degenerative change. Loss of disc space height with disc bulge more eccentric to the  left. Small broad-based central protrusion. No significant central canal stenosis. Mild to moderate left foraminal stenosis. No significant right foraminal stenosis. #  L5-S1: Mild facet degenerative change. No significant stenosis.   IMPRESSION:  1.  Multilevel lumbar spondylosis. Degenerative disc disease most pronounced at L3-L4 and L4-5. Mild multilevel facet degenerative change.  2.  Mild  central canal stenosis and mild bilateral foraminal stenosis at L3-L4.  3.  Mild to moderate left foraminal stenosis at L4-5.  4.  Small broad-based central protrusion at L4-5.  Electronically Signed by: Ozell Rio, MD on 03/21/2024 5:07 PM    MRI lumbar spine 06/2017  FINDINGS:  Segmentation: Standard.   Alignment:  Physiologic.   Vertebrae: L1 hemangioma is unchanged. No compression fracture. No  facet edema.   Conus medullaris: Extends to the L1 level and appears normal.   Paraspinal and other soft tissues: Negative.   Disc levels:   T12-L1: Normal disc space and facets. No spinal canal or  neuroforaminal stenosis.   L1-L2: Right subarticular disc protrusion mildly narrows the right  lateral recess, unchanged.   L2-L3: Normal disc space and facets. No spinal canal or  neuroforaminal stenosis.   L3-L4: Disc desiccation with mild bulge. No spinal canal stenosis or  neural foraminal stenosis.   L4-L5: Disc desiccation with mild bulge.   L5-S1: Normal disc space and facets. No spinal canal or  neuroforaminal stenosis.   Visualized sacrum: Normal.   IMPRESSION:  Unchanged mild degenerative disc disease without spinal canal or  neural foraminal stenosis.     Past Medical History:  Past Medical History:  Diagnosis Date  . Anxiety   . Arthritis   . Asthma (*)   . Chronic back pain   . Depression   . GERD (gastroesophageal reflux disease)   . Hyperlipidemia   . Kidney stone   . Obesity   . Urinary tract infection     Past Surgical History:  Past Surgical History:  Procedure Laterality Date  . Appendectomy    . Bariatric surgery  08/12/2015   sleeve gastrectomy  . Breast biopsy    . Breast surgery Left    BIOPSY - BENIGN  . Endometrial ablation  2005  . Shoulder surgery Right 10/2021  . Tubal ligation      Allergies:  No Known Allergies  Medications History:  No outpatient medications have been marked as taking for the 10/23/24 encounter  (Office Visit) with Kao Ly, NP.    Social History:  Social History   Socioeconomic History  . Marital status: Married    Spouse name: Lynwood  . Number of children: 3  . Years of education: 12  Tobacco Use  . Smoking status: Former    Current packs/day: 0.00    Average packs/day: 0.3 packs/day for 15.0 years (3.8 ttl pk-yrs)    Types: Cigarettes    Start date: 05/11/2000    Quit date: 05/12/2015    Years since quitting: 9.4  . Smokeless tobacco: Current  . Tobacco comments:    Pt vapes daily usage.  Vaping Use  . Vaping status: Every Day  Substance and Sexual Activity  . Alcohol use: No    Alcohol/week: 0.0 standard drinks of alcohol  . Drug use: No  . Sexual activity: Yes    Birth control/protection: Surgical    Family History:  Family History  Problem Relation Age of Onset  . Diabetes Mother   . Cancer Mother   . Breast cancer Mother   . No Known  Problems Father   . No Known Problems Brother   . No Known Problems Daughter   . No Known Problems Son   . No Known Problems Son   . No Known Problems Sister   . No Known Problems Maternal Aunt   . No Known Problems Maternal Uncle   . No Known Problems Paternal Aunt   . No Known Problems Paternal Uncle   . No Known Problems Maternal Grandmother   . No Known Problems Maternal Grandfather   . No Known Problems Paternal Grandmother   . No Known Problems Paternal Grandfather   . No Known Problems Cousin   . Stroke Neg Hx   . Seizures Neg Hx   . Migraines Neg Hx   . BRCA 1/2 Neg Hx   . Colon cancer Neg Hx   . Cowden syndrome Neg Hx   . DES usage Neg Hx   . Endometrial cancer Neg Hx   . Li-Fraumeni syndrome Neg Hx   . Ovarian cancer Neg Hx     Review of Systems:   Review of Systems  Musculoskeletal:  Positive for arthralgias and back pain.    Denies new numbness or weakness     Denies current chest pain, SOB, Oversedation        Physical Exam:   BP: 121/74 Resp:      Pulse: 98        SPO2:     General:   Alert and oriented, No acute distress.   Eye:  Pupils are equal, round and reactive to light, Normal conjunctiva.     Respiratory:  Respirations are non-labored, Symmetrical chest wall expansion.   Cardiovascular:  Normal rate, Normal peripheral perfusion.   Integumentary: two PNS leads pulled, intact, no complications. Cognition and Speech:  Oriented, Speech clear and coherent.   Psychiatric:  Cooperative, Appropriate mood & affect, Normal judgment.      Neuro: muscle tone and strength normal and symmetric, sensation grossly normal, and gait and station normal     Deidre Alice, NP 10/23/2024 / 2:20 PM

## 2024-10-26 ENCOUNTER — Other Ambulatory Visit: Payer: Self-pay | Admitting: Family Medicine

## 2024-11-02 ENCOUNTER — Ambulatory Visit

## 2024-11-02 DIAGNOSIS — Z87891 Personal history of nicotine dependence: Secondary | ICD-10-CM

## 2024-11-02 DIAGNOSIS — Z122 Encounter for screening for malignant neoplasm of respiratory organs: Secondary | ICD-10-CM | POA: Diagnosis not present

## 2024-11-02 DIAGNOSIS — F17210 Nicotine dependence, cigarettes, uncomplicated: Secondary | ICD-10-CM | POA: Diagnosis not present

## 2024-11-08 ENCOUNTER — Other Ambulatory Visit: Payer: Self-pay

## 2024-11-08 ENCOUNTER — Telehealth: Payer: Self-pay | Admitting: Acute Care

## 2024-11-08 NOTE — Telephone Encounter (Signed)
 Please call patient. Let her know her  lung cancer scan was read as a LR 2, 12 month annual follow up. Please let her know there was a new additional incidental finding of a hypoattenuating splenic lesion. Radiology gives the option of follow up in 1 year with the next lung cancer screening, or dedicated pre and post contrast abdominal CT or MRI.  Let her know we will reach out to her PCP for follow up of this finding.   Please call Dr. Vernida office and let her know about this finding. See if she wants to just follow on next years screening scan, or if she wants to order the dedicated pre and post contrast abdominal CT or MRI, and follow for results.  Thanks so much.

## 2024-11-08 NOTE — Telephone Encounter (Signed)
 Called and left VM for pt

## 2024-11-09 ENCOUNTER — Other Ambulatory Visit: Payer: Self-pay

## 2024-11-09 DIAGNOSIS — Z122 Encounter for screening for malignant neoplasm of respiratory organs: Secondary | ICD-10-CM

## 2024-11-09 DIAGNOSIS — Z87891 Personal history of nicotine dependence: Secondary | ICD-10-CM

## 2024-11-09 NOTE — Telephone Encounter (Signed)
 Called and spoke with patient. Reviewed recent Lung CT results. She will complete an annual Lung CT next year. Pt will follow up with Dr. Alvan and discuss additional imaging of spleen if warranted. Per radiology lesion has ben present since 2015 with slight growth. Per patient she was never told these results. I have called her PCP office and spoke with Victoria. She will give message to provider to review results nad contact patient with recommendations. Results have been sent to the PCP for review. Annual Lung CT order placed.

## 2024-11-14 ENCOUNTER — Encounter: Payer: Self-pay | Admitting: Family Medicine

## 2024-11-14 ENCOUNTER — Other Ambulatory Visit: Payer: Self-pay | Admitting: *Deleted

## 2024-11-14 DIAGNOSIS — F3132 Bipolar disorder, current episode depressed, moderate: Secondary | ICD-10-CM

## 2024-11-14 DIAGNOSIS — N390 Urinary tract infection, site not specified: Secondary | ICD-10-CM

## 2024-11-14 MED ORDER — DULOXETINE HCL 60 MG PO CPEP: 60.0000 mg | ORAL_CAPSULE | Freq: Every day | ORAL | 0 refills | Status: AC

## 2024-11-15 MED ORDER — ESTRADIOL 0.01 % VA CREA: 1.0000 | TOPICAL_CREAM | Freq: Every day | VAGINAL | 12 refills | Status: DC

## 2024-11-20 DIAGNOSIS — F3110 Bipolar disorder, current episode manic without psychotic features, unspecified: Secondary | ICD-10-CM | POA: Diagnosis not present

## 2024-12-05 DIAGNOSIS — I87393 Chronic venous hypertension (idiopathic) with other complications of bilateral lower extremity: Secondary | ICD-10-CM | POA: Diagnosis not present

## 2024-12-22 ENCOUNTER — Encounter: Payer: Self-pay | Admitting: Family Medicine

## 2024-12-25 NOTE — Telephone Encounter (Signed)
 Okay for new letter for jury duty

## 2024-12-26 ENCOUNTER — Encounter: Payer: Self-pay | Admitting: Family Medicine

## 2024-12-26 DIAGNOSIS — N920 Excessive and frequent menstruation with regular cycle: Secondary | ICD-10-CM

## 2024-12-26 DIAGNOSIS — N644 Mastodynia: Secondary | ICD-10-CM

## 2024-12-26 LAB — HM MAMMOGRAPHY

## 2024-12-26 NOTE — Telephone Encounter (Signed)
 Ok to take

## 2024-12-26 NOTE — Telephone Encounter (Signed)
 Last read by [name] at 9:27AM on 12/26/2024.

## 2025-01-01 ENCOUNTER — Other Ambulatory Visit: Payer: Self-pay | Admitting: *Deleted

## 2025-01-01 ENCOUNTER — Encounter: Payer: Self-pay | Admitting: Family Medicine

## 2025-01-01 ENCOUNTER — Ambulatory Visit: Payer: Self-pay | Admitting: Family Medicine

## 2025-01-01 DIAGNOSIS — N390 Urinary tract infection, site not specified: Secondary | ICD-10-CM

## 2025-01-01 NOTE — Progress Notes (Signed)
 Please call patient. Normal mammogram.  Repeat in 1 year.

## 2025-01-02 ENCOUNTER — Ambulatory Visit: Payer: Self-pay | Admitting: Family Medicine

## 2025-01-02 ENCOUNTER — Other Ambulatory Visit: Payer: Self-pay | Admitting: Family Medicine

## 2025-01-02 DIAGNOSIS — N644 Mastodynia: Secondary | ICD-10-CM

## 2025-01-02 DIAGNOSIS — E349 Endocrine disorder, unspecified: Secondary | ICD-10-CM

## 2025-01-02 LAB — TSH: TSH: 1.32 u[IU]/mL (ref 0.450–4.500)

## 2025-01-02 LAB — PROGESTERONE: Progesterone: 1.9 ng/mL

## 2025-01-02 LAB — LUTEINIZING HORMONE: LH: 6 m[IU]/mL

## 2025-01-02 LAB — FOLLICLE STIMULATING HORMONE: FSH: 6.6 m[IU]/mL

## 2025-01-02 LAB — ESTRADIOL: Estradiol: 109 pg/mL

## 2025-01-02 LAB — SPECIMEN STATUS REPORT

## 2025-01-02 NOTE — Progress Notes (Signed)
 Hi Bayler, your hormone levels are not fully in the menopausal range, so that explains why you are still spotting hearing there and having some breast tenderness.  What is interesting is that a year ago your labs look like you were postmenopausal.  Do you have a GYN that you have seen before that you could get in with?

## 2025-01-03 ENCOUNTER — Encounter: Payer: Self-pay | Admitting: Family Medicine

## 2025-01-03 MED ORDER — OSELTAMIVIR PHOSPHATE 75 MG PO CAPS: 75.0000 mg | ORAL_CAPSULE | Freq: Every day | ORAL | 0 refills | Status: AC

## 2025-01-03 NOTE — Addendum Note (Signed)
 Addended by: Siera Beyersdorf D on: 01/03/2025 12:19 PM   Modules accepted: Orders

## 2025-01-03 NOTE — Telephone Encounter (Signed)
Orders Placed This Encounter  Procedures   Ambulatory referral to Obstetrics / Gynecology    Referral Priority:   Routine    Referral Type:   Consultation    Referral Reason:   Specialty Services Required    Requested Specialty:   Obstetrics and Gynecology    Number of Visits Requested:   1    

## 2025-01-04 ENCOUNTER — Ambulatory Visit: Payer: Self-pay | Admitting: Family Medicine

## 2025-01-04 LAB — URINE CULTURE

## 2025-01-04 MED ORDER — AMOXICILLIN-POT CLAVULANATE 875-125 MG PO TABS: 1.0000 | ORAL_TABLET | Freq: Two times a day (BID) | ORAL | 0 refills | Status: AC

## 2025-01-04 NOTE — Progress Notes (Signed)
 Hi Nikkole, your urine culture came back positive.  I am going to send over an antibiotic to get this cleared up.

## 2025-01-11 ENCOUNTER — Encounter: Payer: Self-pay | Admitting: Family Medicine

## 2025-01-11 DIAGNOSIS — N390 Urinary tract infection, site not specified: Secondary | ICD-10-CM

## 2025-01-12 NOTE — Telephone Encounter (Signed)
 Last read by [name] at 11:52AM on 01/12/2025.

## 2025-01-17 ENCOUNTER — Ambulatory Visit: Payer: Self-pay | Admitting: Family Medicine

## 2025-01-17 DIAGNOSIS — N390 Urinary tract infection, site not specified: Secondary | ICD-10-CM

## 2025-01-17 LAB — SPECIMEN STATUS REPORT

## 2025-01-17 LAB — URINE CULTURE

## 2025-01-17 NOTE — Progress Notes (Signed)
 Specimen not collected properly per note.  Was this [facility] ?

## 2025-01-19 ENCOUNTER — Ambulatory Visit: Admitting: Family Medicine

## 2025-01-19 ENCOUNTER — Encounter: Payer: Self-pay | Admitting: Family Medicine

## 2025-01-19 VITALS — BP 132/85 | HR 92 | Ht 68.0 in | Wt 208.0 lb

## 2025-01-19 DIAGNOSIS — N939 Abnormal uterine and vaginal bleeding, unspecified: Secondary | ICD-10-CM

## 2025-01-19 NOTE — Progress Notes (Signed)
 History:  [name] is a 54 y.o. [id number] who presents to clinic today for abnormal uterine bleeding. She had a uterine ablation in 2005 for heavy vaginal bleeding. She reports she has not had a period since that procedure. She has had TSH, FSH, LH, estradiol , and progesterone  checked by her PCP. FSH has never been in the postmenopausal range, nor has LH. TSH was within normal limits. She reports for the past several months, she has experiences spotting about once each month. The bleeding would last about a week, but last week the bleeding lasted only 1.5 days.  The following portions of the patient's history were reviewed and updated as appropriate: allergies, current medications, family history, past medical history, social history, past surgical history and problem list.  Review of Systems:  ROS See HPI   Objective:  Physical Exam BP 132/85 (BP Location: Right Arm, Patient Position: Sitting, Cuff Size: Normal)   Pulse 92   Ht 5' 8 (1.727 m)   Wt 208 lb (94.3 kg)   BMI 31.63 kg/m  Physical Exam Constitutional:      General: She is not in acute distress.    Appearance: Normal appearance.  HENT:     Head: Normocephalic and atraumatic.  Pulmonary:     Effort: Pulmonary effort is normal. No respiratory distress.  Musculoskeletal:     Cervical back: Normal range of motion.  Neurological:     General: No focal deficit present.     Mental Status: She is alert and oriented to person, place, and time. Mental status is at baseline.  Psychiatric:        Mood and Affect: Mood normal.        Thought Content: Thought content normal.        Judgment: Judgment normal.    Assessment & Plan:  1. Abnormal uterine bleeding (AUB) (Primary) Patient has not had a regular period for more than 20 years and is now experiencing a change in bleeding. TSH was within normal limits. FSH, LH, estradiol , and progesterone  labs have been at premenopausal levels when previously checked. Will get  pelvic US  to evaluate for structural changes and determine need for endometrial biopsy. Discussed that average age for menopause is 54yo in the [place] . Menopause defined as 12 consecutive months without menses for people with regular periods, but can also be determined by elevated FSH levels. - US  PELVIC COMPLETE WITH TRANSVAGINAL; Future   Joesph DELENA Sear, PA

## 2025-01-22 ENCOUNTER — Other Ambulatory Visit

## 2025-01-22 ENCOUNTER — Other Ambulatory Visit: Payer: Self-pay | Admitting: Family Medicine

## 2025-01-24 LAB — URINE CULTURE

## 2025-01-25 MED ORDER — NITROFURANTOIN MONOHYD MACRO 100 MG PO CAPS: 100.0000 mg | ORAL_CAPSULE | Freq: Two times a day (BID) | ORAL | 0 refills | Status: AC

## 2025-01-25 NOTE — Progress Notes (Signed)
 Hi Nonie, the urine culture did come back positive.  The last culture grew out Klebsiella and E. coli.  This time it just grew out E. coli so the Klebsiella is gone which is good.  It says that sensitive to nitrofurantoin  some going to send that over to your pharmacy.

## 2025-02-01 ENCOUNTER — Other Ambulatory Visit

## 2025-02-01 DIAGNOSIS — N939 Abnormal uterine and vaginal bleeding, unspecified: Secondary | ICD-10-CM

## 2025-02-02 ENCOUNTER — Ambulatory Visit: Payer: Self-pay | Admitting: Family Medicine

## 2025-02-02 DIAGNOSIS — D259 Leiomyoma of uterus, unspecified: Secondary | ICD-10-CM | POA: Insufficient documentation

## 2025-02-02 DIAGNOSIS — R9389 Abnormal findings on diagnostic imaging of other specified body structures: Secondary | ICD-10-CM | POA: Insufficient documentation

## 2025-02-22 ENCOUNTER — Ambulatory Visit: Admitting: Obstetrics and Gynecology

## 2025-10-09 ENCOUNTER — Ambulatory Visit
